# Patient Record
Sex: Male | Born: 1965
Health system: Southern US, Community
[De-identification: ages and names within clinical notes are randomized; demographics above are authoritative.]

## PROBLEM LIST (undated history)

## (undated) ENCOUNTER — Emergency Department (HOSPITAL_COMMUNITY): Payer: Medicaid Other

## (undated) DIAGNOSIS — I10 Essential (primary) hypertension: Secondary | ICD-10-CM

## (undated) DIAGNOSIS — I455 Other specified heart block: Secondary | ICD-10-CM

## (undated) DIAGNOSIS — I2510 Atherosclerotic heart disease of native coronary artery without angina pectoris: Secondary | ICD-10-CM

## (undated) HISTORY — DX: Atherosclerotic heart disease of native coronary artery without angina pectoris: I25.10

## (undated) HISTORY — DX: Other specified heart block: I45.5

## (undated) HISTORY — PX: TONSILLECTOMY AND ADENOIDECTOMY: SUR1326

## (undated) HISTORY — DX: Essential (primary) hypertension: I10

---

## 1999-03-09 ENCOUNTER — Emergency Department (HOSPITAL_COMMUNITY): Admission: EM | Admit: 1999-03-09 | Discharge: 1999-03-09 | Payer: Self-pay | Admitting: Emergency Medicine

## 2002-09-02 ENCOUNTER — Emergency Department (HOSPITAL_COMMUNITY): Admission: EM | Admit: 2002-09-02 | Discharge: 2002-09-02 | Payer: Self-pay | Admitting: Emergency Medicine

## 2014-11-04 ENCOUNTER — Ambulatory Visit (INDEPENDENT_AMBULATORY_CARE_PROVIDER_SITE_OTHER): Payer: Self-pay | Admitting: Family Medicine

## 2014-11-04 VITALS — BP 180/100 | HR 66 | Temp 98.2°F | Resp 16 | Ht 72.0 in | Wt 187.0 lb

## 2014-11-04 DIAGNOSIS — I4510 Unspecified right bundle-branch block: Secondary | ICD-10-CM

## 2014-11-04 DIAGNOSIS — I10 Essential (primary) hypertension: Secondary | ICD-10-CM | POA: Insufficient documentation

## 2014-11-04 DIAGNOSIS — R9431 Abnormal electrocardiogram [ECG] [EKG]: Secondary | ICD-10-CM

## 2014-11-04 DIAGNOSIS — R350 Frequency of micturition: Secondary | ICD-10-CM

## 2014-11-04 LAB — GLUCOSE, POCT (MANUAL RESULT ENTRY): POC Glucose: 76 mg/dl (ref 70–99)

## 2014-11-04 LAB — POCT URINALYSIS DIPSTICK
Blood, UA: NEGATIVE
Glucose, UA: NEGATIVE
LEUKOCYTES UA: NEGATIVE
Nitrite, UA: NEGATIVE
Spec Grav, UA: 1.025
Urobilinogen, UA: 1
pH, UA: 5.5

## 2014-11-04 MED ORDER — LISINOPRIL-HYDROCHLOROTHIAZIDE 10-12.5 MG PO TABS: 1.0000 | ORAL_TABLET | Freq: Every day | ORAL | Status: DC

## 2014-11-04 MED ORDER — PREMIUM AUTOMATIC BP MONITOR DEVI: 1.0000 | Freq: Every day | Status: DC

## 2014-11-04 NOTE — Progress Notes (Signed)
Blythe Hartshorn  MRN: [id number] DOB: 1965-10-15  Subjective:  Pt presents to clinic because he is concerned about his BP.  6 years ago he was told he had elevated BP and was put on medications but he stopped them and has not had care since then.  He had a DOT exam about 5 months and he passed and was given a 2 year card.  Headaches are temporal and squeezing - daily HA for about a month.  Has not been checking BP.  He has had no CP or SOB or LE edema.  He has a strong family history of HTN.  He does not believe he has ever had an EKG.  There are no active problems to display for this patient.   No current outpatient prescriptions on file prior to visit.   No current facility-administered medications on file prior to visit.    No Known Allergies  Review of Systems  Constitutional: Negative for fever and chills.  Cardiovascular: Negative for chest pain and leg swelling.  Neurological: Positive for headaches.   Objective:  BP 180/100 mmHg  Pulse 66  Temp(Src) 98.2 F (36.8 C) (Oral)  Resp 16  Ht 6' (1.829 m)  Wt 187 lb (84.823 kg)  BMI 25.36 kg/m2  SpO2 99%   R arm - 180/100 L arm - 170/100  Physical Exam  Constitutional: He is oriented to person, place, and time and well-developed, well-nourished, and in no distress.  HENT:  Head: Normocephalic and atraumatic.  Right Ear: External ear normal.  Left Ear: External ear normal.  Eyes: Conjunctivae are normal.  Neck: Normal range of motion.  Cardiovascular: Normal rate, regular rhythm, normal heart sounds and intact distal pulses.   Pulmonary/Chest: Effort normal and breath sounds normal. He has no wheezes.  Musculoskeletal:       Right lower leg: He exhibits no edema.       Left lower leg: He exhibits no edema.  Neurological: He is alert and oriented to person, place, and time. Gait normal.  Skin: Skin is warm and dry.  Psychiatric: Mood, memory, affect and judgment normal.   Results for orders placed or performed in  visit on 11/04/14  POCT urinalysis dipstick  Result Value Ref Range   Color, UA yellow    Clarity, UA clear    Glucose, UA neg    Bilirubin, UA small    Ketones, UA trace    Spec Grav, UA 1.025    Blood, UA neg    pH, UA 5.5    Protein, UA trace    Urobilinogen, UA 1.0    Nitrite, UA neg    Leukocytes, UA Negative Negative  POCT glucose (manual entry)  Result Value Ref Range   POC Glucose 76 70 - 99 mg/dl    EKG - RBBB without other acute changes Assessment and Plan :  Essential hypertension - Plan: COMPLETE METABOLIC PANEL WITH GFR, TSH, POCT urinalysis dipstick, EKG 12-Lead, lisinopril-hydrochlorothiazide (PRINZIDE,ZESTORETIC) 10-12.5 MG per tablet - start BP medication - pt to check BP with home monitoring cuff - he will recheck with me in about 3 weeks and we will try and get his cardiology appt at the same time - he will contact me if he has any problems before that appt  Nonspecific abnormal electrocardiogram (ECG) (EKG) - to cardiology for evaluation due to presumed new RBBB - ? Related to possible LVH due to elevated BP  RBBB (right bundle branch block)  D/w Dr Nance Pew  Erhard Senske PA-C  [facility] 11/04/2014 7:18 PM  I have discussed this pts care with Ms. Noele Icenhour. He does have an abnormal EKG consistent with RBBB but adamantly denies any CP.  Will start treating his HTN and arrange follow-up with cardiology

## 2014-11-04 NOTE — Patient Instructions (Signed)
I will contact you with your lab results as soon as they are available.   If you have not heard from me in 2 weeks, please contact me.  The fastest way to get your results is to register for My Chart (see the instructions on the last page of this printout).  Check your BP 2-3 times a week and when and if your headache gets worse -  If you developed shortness of breath and or chest pain - please seek medical care - you have the EKG we ran today for comparison if you need it  Decrease the salt in your diet

## 2014-11-05 LAB — COMPLETE METABOLIC PANEL WITH GFR
ALT: 15 U/L (ref 9–46)
AST: 18 U/L (ref 10–35)
Albumin: 4.4 g/dL (ref 3.6–5.1)
Alkaline Phosphatase: 95 U/L (ref 40–115)
BUN: 15 mg/dL (ref 7–25)
CALCIUM: 9.1 mg/dL (ref 8.6–10.3)
CHLORIDE: 103 mmol/L (ref 98–110)
CO2: 27 mmol/L (ref 20–31)
CREATININE: 1.17 mg/dL (ref 0.70–1.33)
GFR, Est African American: 83 mL/min (ref >=60)
GFR, Est Non African American: 72 mL/min (ref >=60)
GLUCOSE: 75 mg/dL (ref 65–99)
POTASSIUM: 3.8 mmol/L (ref 3.5–5.3)
SODIUM: 139 mmol/L (ref 135–146)
Total Bilirubin: 0.5 mg/dL (ref 0.2–1.2)
Total Protein: 7.4 g/dL (ref 6.1–8.1)

## 2014-11-05 LAB — TSH: TSH: 1.176 u[IU]/mL (ref 0.350–4.500)

## 2014-11-15 ENCOUNTER — Encounter: Payer: Self-pay | Admitting: Family Medicine

## 2014-11-15 DIAGNOSIS — E041 Nontoxic single thyroid nodule: Secondary | ICD-10-CM

## 2014-12-06 ENCOUNTER — Ambulatory Visit (INDEPENDENT_AMBULATORY_CARE_PROVIDER_SITE_OTHER): Payer: Self-pay | Admitting: Physician Assistant

## 2014-12-06 ENCOUNTER — Other Ambulatory Visit: Payer: Self-pay

## 2014-12-06 ENCOUNTER — Ambulatory Visit
Admission: RE | Admit: 2014-12-06 | Discharge: 2014-12-06 | Disposition: A | Discharge status: Home / Self Care | Payer: No Typology Code available for payment source | Source: Ambulatory Visit | Attending: Physician Assistant | Admitting: Physician Assistant

## 2014-12-06 VITALS — BP 126/74 | HR 81 | Temp 98.6°F | Resp 18 | Ht 72.0 in | Wt 191.0 lb

## 2014-12-06 DIAGNOSIS — E041 Nontoxic single thyroid nodule: Secondary | ICD-10-CM

## 2014-12-06 DIAGNOSIS — I10 Essential (primary) hypertension: Secondary | ICD-10-CM

## 2014-12-06 IMAGING — US US SOFT TISSUE HEAD/NECK
1 series · 14 of 25 positions shown · non-contrast
Comparison: None.

CLINICAL DATA: Thyroid nodule

EXAM:
THYROID ULTRASOUND
TECHNIQUE: Ultrasound examination of the thyroid gland and adjacent soft
tissues was performed.

[Series 1: us soft tissue head/neck · 0.08mm/px · 14 of 46 slices shown]
[im 1/46]
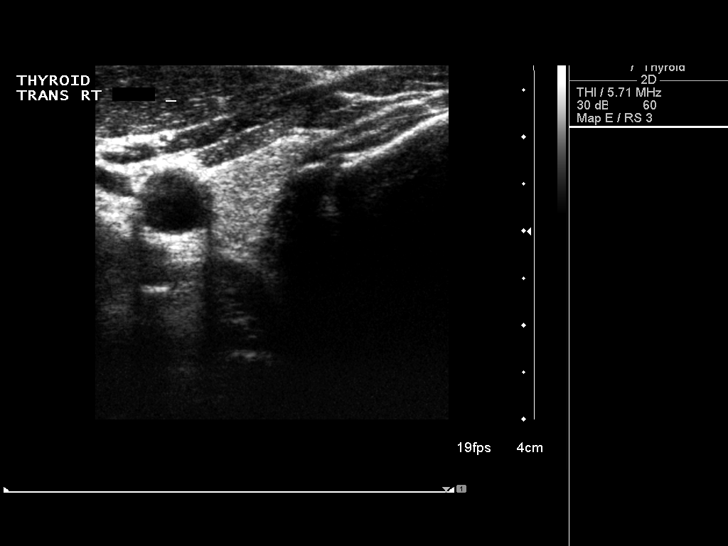
[im 4/46]
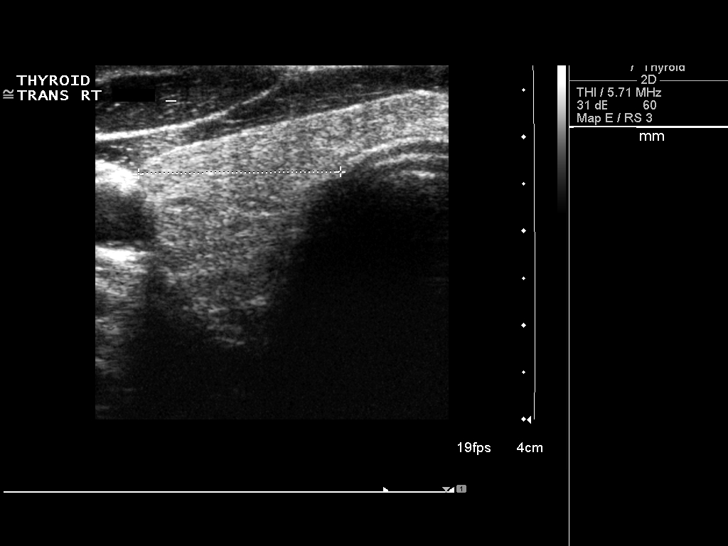
[im 8/46]
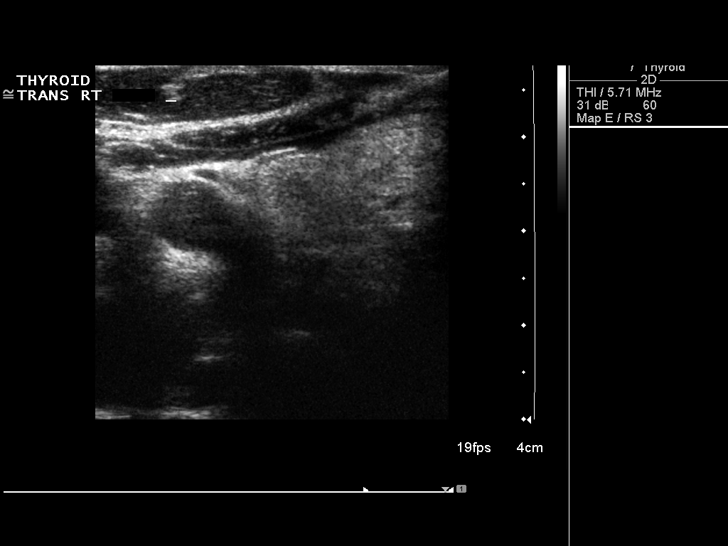
[im 12/46]
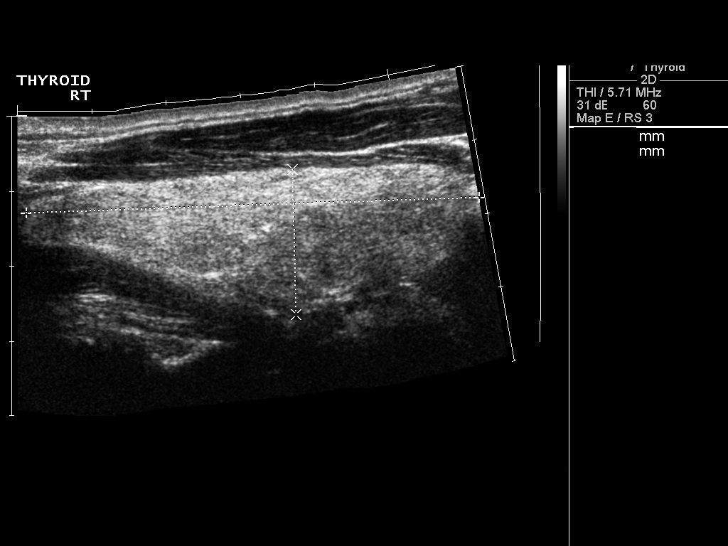
[im 16/46]
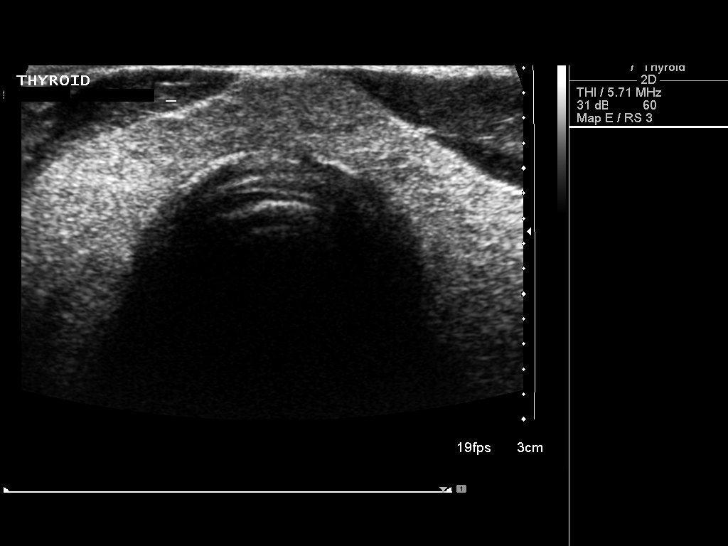
[im 17/46]
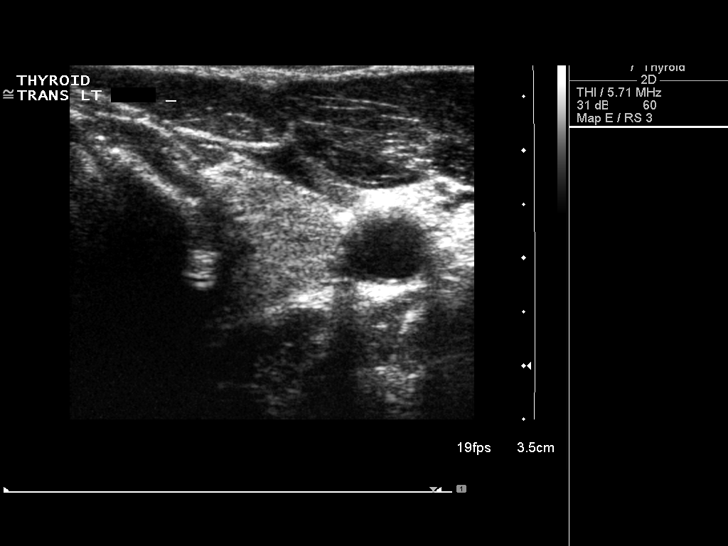
[im 21/46]
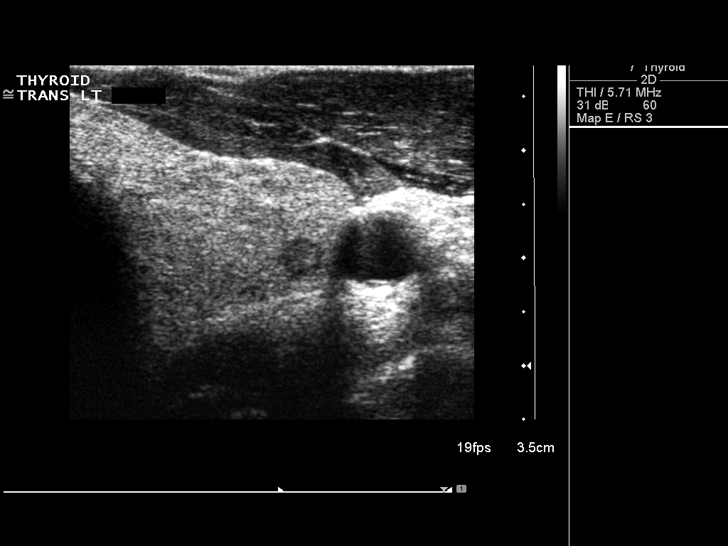
[im 25/46]
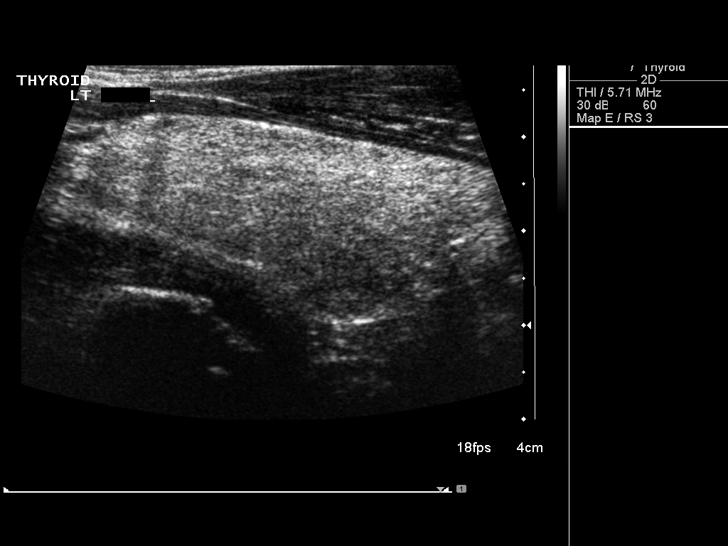
[im 29/46]
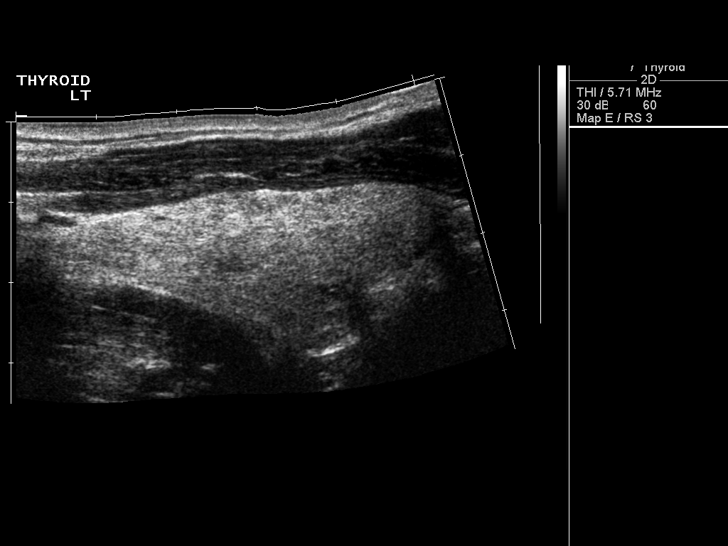
[im 31/46]
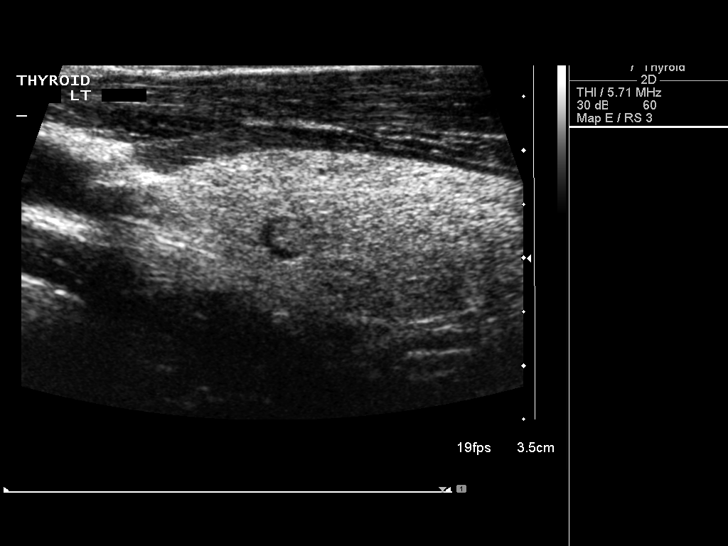
[im 34/46]
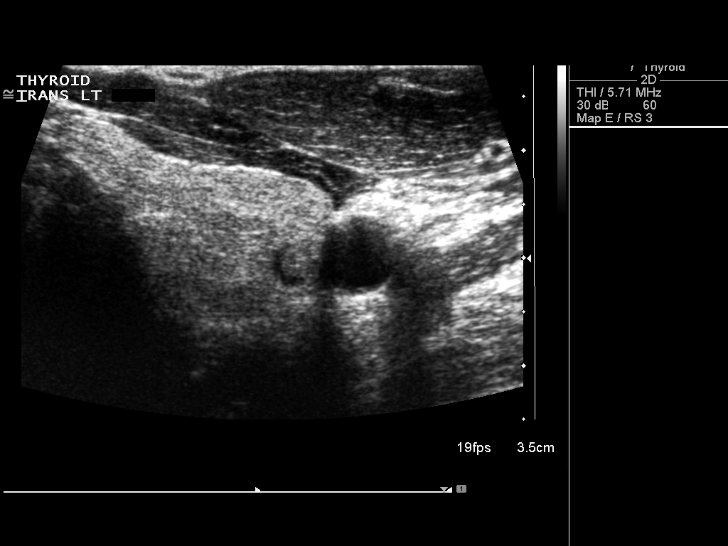
[im 38/46]
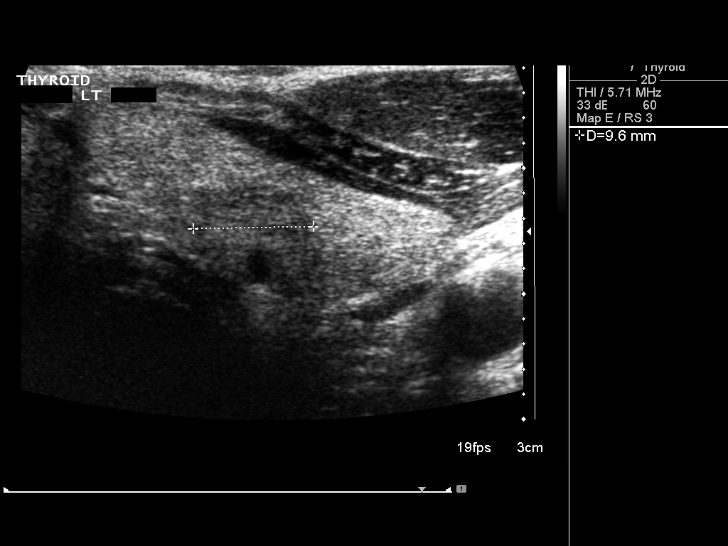
[im 42/46]
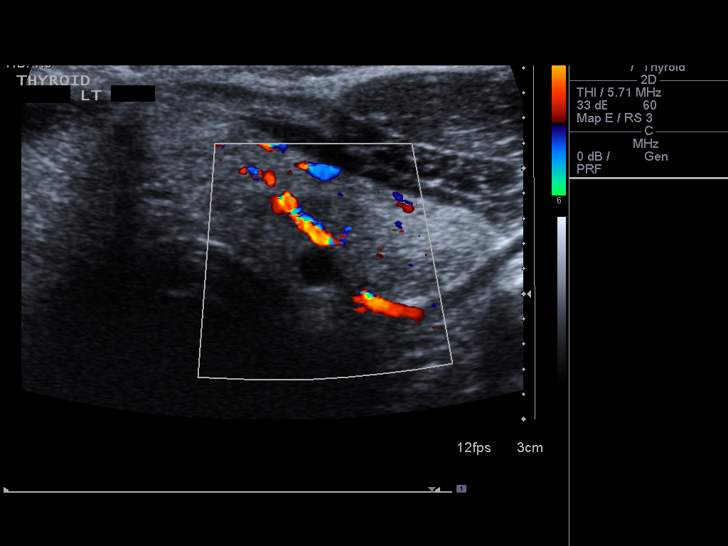
[im 46/46]
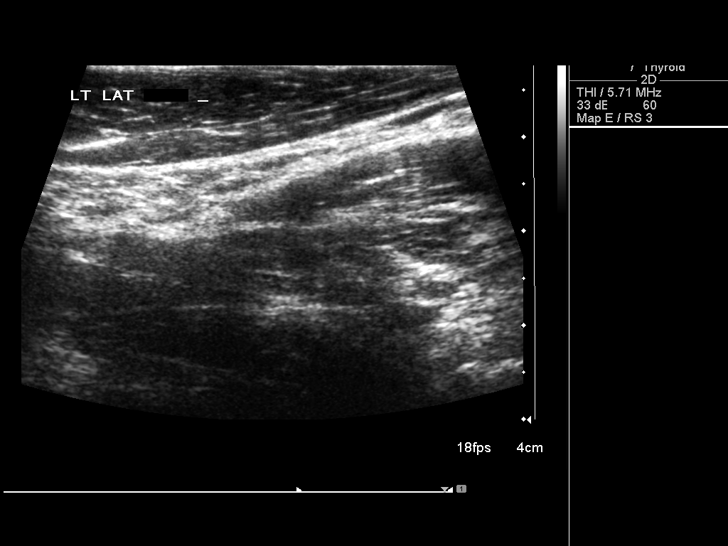

[14 of 25 positions shown; findings below may reference images not displayed]

FINDINGS: Right thyroid lobe

Measurements: 61 x 19 x 21 mm. Mildly inhomogeneous background
echotexture without focal lesion.

Left thyroid lobe

Measurements: 53 x 20 x 22 mm. 5 x 4 mm complex mostly solid nodule,
mid lobe. Somewhat poorly marginated slightly hypoechoic 12 x 9 x 10
mm nodule, inferior pole.

Isthmus

Thickness: 6 mm.  No nodules visualized.

Lymphadenopathy

None visualized.
IMPRESSION: 1. Thyromegaly with small left nodules. Findings do not meet current
consensus criteria for biopsy. Follow-up by clinical exam is
recommended. If patient has known risk factors for thyroid
carcinoma, consider follow-up ultrasound in 12 months. If patient is
clinically hyperthyroid, consider nuclear medicine thyroid uptake
and scan. This recommendation follows the consensus statement:
Management of Thyroid Nodules Detected as US: Society of
Radiologists in Ultrasound Consensus Conference Statement. Radiology

## 2014-12-06 MED ORDER — LISINOPRIL-HYDROCHLOROTHIAZIDE 20-25 MG PO TABS: 1.0000 | ORAL_TABLET | Freq: Every day | ORAL | Status: DC

## 2014-12-06 NOTE — Patient Instructions (Signed)
Keep tracking  Your BP - today when I took your BP my reading was 132/84 and your monitor read 159/93 - so as long as your readings are not above 165/100 that should be fine - please contact me if you have any problems.

## 2014-12-06 NOTE — Progress Notes (Signed)
Subjective:     Patient ID: Robert [name], male   DOB: [birth year], 49 y.o.   MRN: [id number] PCP: No primary care provider on file.  Chief Complaint  Patient presents with  . Follow-up  . Hypertension    HPI Patient presents today for follow-up of blood pressure. He was seen at this clinic on 08/26 for concerns about elevated blood pressure. He was started on lisinopril-hydrochlorothiazide 10-12.5 mg once a day.   He has been monitoring his BP at home. On 09/06, he sent a My Chart message about concerns that his BP was reading in the 150s systolic at home. He was counseled to start taking the lisinopril-hydrochlorothiazide 10-12.5 mg tablets twice a day, which he has been doing since then. He states that he has still been getting elevated readings in the 150s systolic at home with his wrist cuff.   Headaches have been less frequent since starting on the medication. No chest pain or shortness of breath. No visual disturbances. No swelling in lower extremities.   He has an appointment with cardiology at the end of October.   Review of Systems See HPI    Objective:  Physical Exam  Constitutional: He is oriented to person, place, and time. He appears well-developed and well-nourished.  HENT:  Head: Normocephalic and atraumatic.  Neck: Normal range of motion. Neck supple.  Cardiovascular: Normal rate and regular rhythm.   Pulmonary/Chest: Effort normal and breath sounds normal.  Neurological: He is alert and oriented to person, place, and time.  Skin: Skin is warm and dry.  Psychiatric: He has a normal mood and affect. His behavior is normal. Thought content normal.    BP 126/74 mmHg  Pulse 81  Temp(Src) 98.6 F (37 C) (Oral)  Resp 18  Ht 6' (1.829 m)  Wt 191 lb (86.637 kg)  BMI 25.90 kg/m2  SpO2 98%  The patient brought his blood pressure cuff to the visit. His BP was retaken with the manual cuff and the reading was 132/84 of the right arm. At the same time, the patient was  taking his BP with his wrist cuff on the left wrist and the reading was 159/93.   Assessment & Plan:  1. Essential hypertension New prescription was written, lisinopril-hydrochlorothiazide 20-25 tablets, which the patient is to take once a day. Counseled that his wrist cuff is not accurate and is giving him higher readings. If his wrist cuff shows a reading above 165/100, he is to call the office.  - lisinopril-hydrochlorothiazide (PRINZIDE,ZESTORETIC) 20-25 MG tablet; Take 1 tablet by mouth daily.  Dispense: 90 tablet; Refill: 1  Return in about 6 months (around 06/05/2014) for follow-up. Cardiology appointment in October.    Tifini Reeder D. Race, PA-S Physician Assistant Student [facility]

## 2014-12-06 NOTE — Progress Notes (Signed)
   Robert [name]  MRN: [id number] DOB: 04/17/1965  Subjective:  Pt presents to clinic for recheck of his BP.  He has been doing really well - he has been taking Lisinopril 10/12.5 bid.  He has been checking his BP at home with a wrist cuff and it has been running high 150s/ 90s.  He feels much better since starting on the medications.  He is having no problems with the medications.  He plans to go to his appt with cardiology on 10/27 with Dr Mayford Knife.  Patient Active Problem List   Diagnosis Date Noted  . HTN (hypertension) 11/04/2014    Current Outpatient Prescriptions on File Prior to Visit  Medication Sig Dispense Refill  . Blood Pressure Monitoring (PREMIUM AUTOMATIC BP MONITOR) DEVI 1 Device by Does not apply route daily. 1 Device 0   No current facility-administered medications on file prior to visit.    No Known Allergies  Review of Systems  Respiratory: Negative for cough.   Cardiovascular: Negative for chest pain, palpitations and leg swelling.   Objective:  BP 126/74 mmHg  Pulse 81  Temp(Src) 98.6 F (37 C) (Oral)  Resp 18  Ht 6' (1.829 m)  Wt 191 lb (86.637 kg)  BMI 25.90 kg/m2  SpO2 98%  Physical Exam  Constitutional: He is oriented to person, place, and time and well-developed, well-nourished, and in no distress.  HENT:  Head: Normocephalic and atraumatic.  Right Ear: External ear normal.  Left Ear: External ear normal.  Eyes: Conjunctivae are normal.  Neck: Normal range of motion.  Cardiovascular: Normal rate, regular rhythm, normal heart sounds and intact distal pulses.   Pulmonary/Chest: Effort normal and breath sounds normal. He has no wheezes.  Musculoskeletal:       Right lower leg: He exhibits no edema.       Left lower leg: He exhibits no edema.  Neurological: He is alert and oriented to person, place, and time. Gait normal.  Skin: Skin is warm and dry.  Psychiatric: Mood, memory, affect and judgment normal.   132/84 - manual BP reading 159/93 -  wrist cuff reading  Assessment and Plan :  Essential hypertension - Plan: lisinopril-hydrochlorothiazide (PRINZIDE,ZESTORETIC) 20-25 MG tablet  We will continue medication - we will recheck in 6 months - he will continue to monitor his BP at home with his wrist cuff but he is now aware that his readings will be higher than what normal range is because his machine runs high as we demonstrated today while he was in the office.  He will f/u with cardiology on 10/27 and let me know if he has any problems.  Benny Lennert PA-C  [facility] 12/06/2014 4:21 PM

## 2015-01-05 ENCOUNTER — Ambulatory Visit: Payer: Self-pay | Admitting: Cardiology

## 2015-02-01 ENCOUNTER — Ambulatory Visit: Payer: Self-pay | Admitting: Cardiology

## 2015-02-01 DIAGNOSIS — R0989 Other specified symptoms and signs involving the circulatory and respiratory systems: Secondary | ICD-10-CM

## 2015-02-07 ENCOUNTER — Encounter: Payer: Self-pay | Admitting: Cardiology

## 2015-06-28 ENCOUNTER — Telehealth: Payer: Self-pay

## 2015-06-28 DIAGNOSIS — I10 Essential (primary) hypertension: Secondary | ICD-10-CM

## 2015-06-28 MED ORDER — LISINOPRIL-HYDROCHLOROTHIAZIDE 20-25 MG PO TABS: 1.0000 | ORAL_TABLET | Freq: Every day | ORAL | Status: DC

## 2015-06-28 NOTE — Telephone Encounter (Signed)
Maudia called me to ask about this pt and I advised her I will send in 1 mos to give pt time to get in. She stated she will let him know. He had told her that he would come in next week.

## 2015-06-28 NOTE — Telephone Encounter (Signed)
Pt isn't happy, states he have been sitting out in the waiting room for over 2 hrs since he had brought someone over to see the Dr states he checked with us to let us know he is in need of His bp meds and we wrote it down for him on a piece of paper but didn't call it in. 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 Please call pt at [phone]     [facility] [address]

## 2015-09-26 ENCOUNTER — Telehealth: Payer: Self-pay

## 2015-09-26 NOTE — Telephone Encounter (Signed)
Pt would like a refill on lisinopril-hydrochlorothiazide (PRINZIDE,ZESTORETIC) 20-25 MG tablet [[id number]][[id number]]. I let pt know he needs an OV for an additional refill. He is in [place] wanting it faxed there. [facility]  Address: [address], [place], [place] [id number]. Please advise at [phone]

## 2015-10-31 ENCOUNTER — Ambulatory Visit (INDEPENDENT_AMBULATORY_CARE_PROVIDER_SITE_OTHER): Payer: Self-pay | Admitting: Physician Assistant

## 2015-10-31 VITALS — BP 144/96 | HR 64 | Temp 98.2°F | Resp 18 | Ht 71.0 in | Wt 171.4 lb

## 2015-10-31 DIAGNOSIS — Z1159 Encounter for screening for other viral diseases: Secondary | ICD-10-CM

## 2015-10-31 DIAGNOSIS — Z1211 Encounter for screening for malignant neoplasm of colon: Secondary | ICD-10-CM

## 2015-10-31 DIAGNOSIS — I10 Essential (primary) hypertension: Secondary | ICD-10-CM

## 2015-10-31 DIAGNOSIS — Z021 Encounter for pre-employment examination: Secondary | ICD-10-CM

## 2015-10-31 DIAGNOSIS — Z23 Encounter for immunization: Secondary | ICD-10-CM

## 2015-10-31 DIAGNOSIS — Z114 Encounter for screening for human immunodeficiency virus [HIV]: Secondary | ICD-10-CM

## 2015-10-31 LAB — COMPLETE METABOLIC PANEL WITH GFR
ALT: 19 U/L (ref 9–46)
AST: 17 U/L (ref 10–35)
Albumin: 4.6 g/dL (ref 3.6–5.1)
Alkaline Phosphatase: 83 U/L (ref 40–115)
BILIRUBIN TOTAL: 0.7 mg/dL (ref 0.2–1.2)
BUN: 12 mg/dL (ref 7–25)
CALCIUM: 9.2 mg/dL (ref 8.6–10.3)
CHLORIDE: 104 mmol/L (ref 98–110)
CO2: 27 mmol/L (ref 20–31)
CREATININE: 1.09 mg/dL (ref 0.70–1.33)
GFR, EST NON AFRICAN AMERICAN: 78 mL/min (ref >=60)
Glucose, Bld: 81 mg/dL (ref 65–99)
Potassium: 3.9 mmol/L (ref 3.5–5.3)
Sodium: 141 mmol/L (ref 135–146)
TOTAL PROTEIN: 7.7 g/dL (ref 6.1–8.1)

## 2015-10-31 MED ORDER — LISINOPRIL-HYDROCHLOROTHIAZIDE 20-25 MG PO TABS: 1.0000 | ORAL_TABLET | Freq: Every day | ORAL | 0 refills | Status: DC

## 2015-10-31 NOTE — Progress Notes (Signed)
   Robert KuRobert Camus  MRN: [id number] DOB: 07-08-1965  Subjective:  Pt presents to clinic for BP recheck.  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  Review of Systems  Respiratory: Negative for cough and shortness of breath.   Cardiovascular: Negative for chest pain, palpitations and leg swelling.    Patient Active Problem List   Diagnosis Date Noted  . HTN (hypertension) 11/04/2014    Current Outpatient Prescriptions on File Prior to Visit  Medication Sig Dispense Refill  . Blood Pressure Monitoring (PREMIUM AUTOMATIC BP MONITOR) DEVI 1 Device by Does not apply route daily. 1 Device 0   No current facility-administered medications on file prior to visit.     No Known Allergies  Pt patients past, family and social history were reviewed and updated.  Objective:  BP (!) 144/96   Pulse 64   Temp 98.2 F (36.8 C) (Oral)   Resp 18   Ht 5\' 11"  (1.803 m)   Wt 171 lb 6.4 oz (77.7 kg)   BMI 23.91 kg/m   Physical Exam  Constitutional: He is oriented to person, place, and time and well-developed, well-nourished, and in no distress.  HENT:  Head: Normocephalic and atraumatic.  Right Ear: External ear normal.  Left Ear: External ear normal.  Eyes: Conjunctivae are normal.  Neck: Normal range of motion.  Cardiovascular: Normal rate, regular rhythm, normal heart sounds and intact distal pulses.   Pulmonary/Chest: Effort normal and breath sounds normal. He has no wheezes.  Musculoskeletal:       Right lower leg: He exhibits no edema.       Left lower leg: He exhibits no edema.  Neurological: He is alert and oriented to person, place, and time. Gait normal.  Skin: Skin is warm and dry.  Psychiatric: Mood, memory, affect and judgment normal.    Assessment and Plan :  Essential hypertension - Plan: COMPLETE METABOLIC PANEL WITH GFR, lisinopril-hydrochlorothiazide (PRINZIDE,ZESTORETIC)  20-25 MG tablet - restart medications and recheck before 3 months when his DOT will expire.  Need for Tdap vaccination  Need for hepatitis C screening test - Plan: Hepatitis C antibody  Encounter for screening for HIV - Plan: HIV antibody  Screen for colon cancer - Plan: Ambulatory referral to Gastroenterology  Benny LennertSarah Shanedra Lave PA-C  [facility] 10/31/2015 3:28 PM

## 2015-10-31 NOTE — Patient Instructions (Addendum)
Recheck with me in before 3 months at that time I will recheck your BP and extend your DOT card at that time - you will want to call and make sure that you see me at that visit - I will be working on Tuesday and Thursdays    IF you received an x-ray today, you will receive an invoice from [facility]. Please contact [facility] at [phone] with questions or concerns regarding your invoice.   IF you received labwork today, you will receive an invoice from [facility]. Please contact [facility] at [phone] with questions or concerns regarding your invoice.   Our billing staff will not be able to assist you with questions regarding bills from these companies.  You will be contacted with the lab results as soon as they are available. The fastest way to get your results is to activate your My Chart account. Instructions are located on the last page of this paperwork. If you have not heard from us regarding the results in 2 weeks, please contact this office.

## 2015-10-31 NOTE — Progress Notes (Signed)
This patient presents for DOT examination for fitness for duty. He has had 1 year cards in the past due to HTN.  Medical History:  no  1. Head/Brain Injuries, disorders or illnesses no  2. Seizures, epilepsy no  3. Eye disorders or impaired vision (except corrective lenses) no  4. Ear disorders, loss of hearing or balance no  5. Heart disease or heart attack, other cardiovascular condition no  6. Heart surgery (valve replacement/bypass, angioplasty, pacemaker/defribrillator) yes  7. High blood pressure -- on medications no  8. High holesterol no  9. Chronic cough, shortness of breath or other breathing problems no  10. Lung disease (emphysema, asthma or chronic bronchitis) no  11. Kidney disease, dialysis no  12. Digestive problems  no  13. Diabetes or elevated blood sugar  no  Insulin use no  14. Nervous or psychiatric disorders, e.g., severe depression no  15. Fainting or syncope no  16. Dizziness, headaches, numbness, tingling or memory loss no  17. Unexplained weight loss no  18. Stroke, TIA or paralysis no  19. Missing or impaired hand, arm, foot, leg, finger, toe no  20. Spinal injury or disease no  21. Bone, muscles or nerve problems no  22. Blood clots or bleeding bleeding disorders no  23. Cancer no  24. Chronic infection or other chronic diseases no  25. Sleep disorders, pauses in breathing while asleep, daytime sleepiness, loud snoring no  26. Have you ever had a sleep test? no  27.  Have you ever spent a night in the hospital? no  28. Have you ever had a broken bone? yes  29. Have you or or do you use tobacco products? - 1 ppd no  30. Regular, frequent alcohol use no  31. Illegal substance use within the past 2 years no  32.  Have you ever failed a drug test or been dependent on an illegal substance?  Current Medications: Prior to Admission medications   Medication Sig Start Date End Date Taking? Authorizing Provider  Blood Pressure Monitoring (PREMIUM AUTOMATIC  BP MONITOR) DEVI 1 Device by Does not apply route daily. 11/04/14  Yes Morrell RiddleSarah L Weber, PA-C  lisinopril-hydrochlorothiazide (PRINZIDE,ZESTORETIC) 20-25 MG tablet Take 1 tablet by mouth daily. Patient not taking: Reported on 10/31/2015 06/28/15   Morrell RiddleSarah L Weber, PA-C    Medical Examiner's Comments on Health History:  HTN - been off of medications for the last couple of months  TESTING:   Visual Acuity Screening   Right eye Left eye Both eyes  Without correction: 20/20 20/20 20/20   With correction:     Comments: The patient can distinguish the colors red, amber and green. Peripheral Vision: Right eye 85 degrees. Left eye 85 degrees.   Monocular Vision: No.  Hearing Aid used for test: No. Hearing Aid required to to meet standard: No.  BP (!) 150/84 (BP Location: Left Arm, Patient Position: Sitting, Cuff Size: Large)   Pulse 64   Temp 98.2 F (36.8 C) (Oral)   Resp 18   Ht 5\' 11"  (1.803 m)   Wt 171 lb 6.4 oz (77.7 kg)   BMI 23.91 kg/m  Pulse rate is regular  Comments: UA - Prot. - zero; Blood - zero; SG 1.020;  Gluc. -zero.  PHYSICAL EXAMINATION:  1. No. General Appearance: Marked overweight, tremor, signs of alcoholism, problem drinking or drug abuse. 2. No. Skin Exam - tattoos, scars 3. No. Eyes: pupillary equality, reaction to light, accommodation, ocular motility, ocular muscle imbalance, extra ocular movement,  nystagmus, exopthalmos. Ask about retinopathy, cataracts, aphakia, glaucoma, macular degeneration and refer to a specialist if appropriate.  4. No. Ears: Scarring of tympanic membrane, occlusion of external canal, perforated eardrums.     5. No. Mouth and Throat: Irremedial deformities likely to interfere with breathing or swallowing.    6. No. Heart: Murmurs, extra sounds, enlarged heart, pacemaker, implantable defibrillator.     7. No. Lungs and Chest, not including breast examination: Abnormal Chest wall expansion, abnormal respiratory rate, abnormal breath sounds  including wheezes or alveolar rates, impaired respiratory function, cyanosis. Abnormal findings on physical exam may require further testing such as pulmonary tests and/or x ray of chest.  8. No. Abdomen and Viscera: Enlarged liver, enlarged spleen, masses, bruits, hernia, significant abdominal wall muscle weakness.  9. No. Genitourinary System: Hernia  10. No. Spine, other musculoskeletal: Previous surgery, deformities, limitation of motion, tenderness. 11. No. Extremities-Limb impaired: Loss or impairment of leg, foot, toe, arm, hand, finger. Perceptible limp, deformities, atrophy, weakness, paralysis, clubbing, edema, hypotonia. Insufficient grasp and prehension to maintain steering wheel grip. Insufficient mobility and strength in lower limb to operate pedals properly. 12. No. Neurological: Impaired equilibrium, coordination or speech pattern; paresthesia, asymmetric deep tendon reflexes, sensory or positional abnormalities, abnormal patellar and Babinski's reflexes 13. No. Gait - antalgic, ataxia  14. No. Vascular System: Abnormal pulse and amplitude, carotid or arterial bruits, varicose veins.   Does not meet standards. Certification Status: does not meet standards for 2 year certificate. Meets standards, but periodic monitoring required due to: HTN  Driver qualified only for: 3 months   Return to medical examiner's office for follow-up on   Wearing corrective lenses: no Wearing hearing aid: no Accompanied by a no waiver/exemption Skill performance Evaluation (SPE) Certificate: no Driving within an exempt intracity zone: no Qualified by operation of 49 CFR 391.64: no  Certification expires 01/31/2016  Benny LennertSarah Weber PA-C  [facility] 10/31/2015 3:05 PM

## 2015-11-01 LAB — HIV ANTIBODY (ROUTINE TESTING W REFLEX): HIV: NONREACTIVE

## 2015-11-01 LAB — HEPATITIS C ANTIBODY: HCV AB: NEGATIVE

## 2015-12-04 ENCOUNTER — Encounter: Payer: Self-pay | Admitting: Physician Assistant

## 2016-02-24 ENCOUNTER — Other Ambulatory Visit: Payer: Self-pay | Admitting: Physician Assistant

## 2016-02-24 DIAGNOSIS — I10 Essential (primary) hypertension: Secondary | ICD-10-CM

## 2016-02-27 NOTE — Telephone Encounter (Signed)
Spoke with pt.  He is not out of meds at this time. Advised he needs to make appointment.  SHB 01/2016. Pt agreed he would make appt.

## 2016-03-26 ENCOUNTER — Encounter: Payer: Self-pay | Admitting: Physician Assistant

## 2020-04-26 ENCOUNTER — Other Ambulatory Visit: Payer: Self-pay | Admitting: Physician Assistant

## 2020-04-26 ENCOUNTER — Other Ambulatory Visit: Payer: Self-pay

## 2020-04-26 ENCOUNTER — Ambulatory Visit: Payer: Self-pay | Admitting: Physician Assistant

## 2020-04-26 VITALS — BP 153/98 | HR 64 | Temp 98.2°F | Resp 18 | Ht 72.0 in | Wt 170.0 lb

## 2020-04-26 DIAGNOSIS — M4302 Spondylolysis, cervical region: Secondary | ICD-10-CM | POA: Insufficient documentation

## 2020-04-26 DIAGNOSIS — Z114 Encounter for screening for human immunodeficiency virus [HIV]: Secondary | ICD-10-CM

## 2020-04-26 DIAGNOSIS — I10 Essential (primary) hypertension: Secondary | ICD-10-CM

## 2020-04-26 DIAGNOSIS — R200 Anesthesia of skin: Secondary | ICD-10-CM | POA: Insufficient documentation

## 2020-04-26 DIAGNOSIS — Z13228 Encounter for screening for other metabolic disorders: Secondary | ICD-10-CM

## 2020-04-26 DIAGNOSIS — M5412 Radiculopathy, cervical region: Secondary | ICD-10-CM

## 2020-04-26 DIAGNOSIS — Z125 Encounter for screening for malignant neoplasm of prostate: Secondary | ICD-10-CM

## 2020-04-26 MED ORDER — AMLODIPINE BESYLATE 10 MG PO TABS: 10.0000 mg | ORAL_TABLET | Freq: Every day | ORAL | 2 refills | Status: DC

## 2020-04-26 MED ORDER — GABAPENTIN 800 MG PO TABS
800.0000 mg | ORAL_TABLET | Freq: Two times a day (BID) | ORAL | 1 refills | Status: DC
Start: 2020-04-26 — End: 2020-07-12

## 2020-04-26 MED ORDER — LISINOPRIL-HYDROCHLOROTHIAZIDE 20-12.5 MG PO TABS: 1.0000 | ORAL_TABLET | Freq: Every day | ORAL | 2 refills | Status: DC

## 2020-04-26 MED FILL — GABAPENTIN 800 MG TABLET: 800 | 30 days supply | Qty: 60 | Fill #0

## 2020-04-26 MED FILL — LISINOPRIL-HCTZ 20-12.5 MG: 20-12.5 | 30 days supply | Qty: 30 | Fill #0

## 2020-04-26 MED FILL — AMLODIPINE BESYLATE 10 MG T: 10 | 30 days supply | Qty: 30 | Fill #0

## 2020-04-26 NOTE — Progress Notes (Signed)
Patient presents with a need for refills on BP medication and nerve pain medication. Patient denies pain at this time. Patient has been out of gabapentin for 2 weeks and BP medications he has been taking sparingly. Last took 2 days ago.

## 2020-04-26 NOTE — Patient Instructions (Signed)
Please return to the mobile unit in  2 weeks for fasting labs and follow-up of blood pressure.  Please let us know if there is anything else we can do for you  Roney Jaffe, PA-C Physician Assistant [facility] Mobile Medicine [email or web address]    Health Maintenance, Male Adopting a healthy lifestyle and getting preventive care are important in promoting health and wellness. Ask your health care provider about:  The right schedule for you to have regular tests and exams.  Things you can do on your own to prevent diseases and keep yourself healthy. What should I know about diet, weight, and exercise? Eat a healthy diet  Eat a diet that includes plenty of vegetables, fruits, low-fat dairy products, and lean protein.  Do not eat a lot of foods that are high in solid fats, added sugars, or sodium.   Maintain a healthy weight Body mass index (BMI) is a measurement that can be used to identify possible weight problems. It estimates body fat based on height and weight. Your health care provider can help determine your BMI and help you achieve or maintain a healthy weight. Get regular exercise Get regular exercise. This is one of the most important things you can do for your health. Most adults should:  Exercise for at least 150 minutes each week. The exercise should increase your heart rate and make you sweat (moderate-intensity exercise).  Do strengthening exercises at least twice a week. This is in addition to the moderate-intensity exercise.  Spend less time sitting. Even light physical activity can be beneficial. Watch cholesterol and blood lipids Have your blood tested for lipids and cholesterol at 58 years of age, then have this test every 5 years. You may need to have your cholesterol levels checked more often if:  Your lipid or cholesterol levels are high.  You are older than 57 years of age.  You are at high risk for heart  disease. What should I know about cancer screening? Many types of cancers can be detected early and may often be prevented. Depending on your health history and family history, you may need to have cancer screening at various ages. This may include screening for:  Colorectal cancer.  Prostate cancer.  Skin cancer.  Lung cancer. What should I know about heart disease, diabetes, and high blood pressure? Blood pressure and heart disease  High blood pressure causes heart disease and increases the risk of stroke. This is more likely to develop in people who have high blood pressure readings, are of African descent, or are overweight.  Talk with your health care provider about your target blood pressure readings.  Have your blood pressure checked: ? Every 3-5 years if you are 3-49 years of age. ? Every year if you are 46 years old or older.  If you are between the ages of 61 and 16 and are a current or former smoker, ask your health care provider if you should have a one-time screening for abdominal aortic aneurysm (AAA). Diabetes Have regular diabetes screenings. This checks your fasting blood sugar level. Have the screening done:  Once every three years after age 64 if you are at a normal weight and have a low risk for diabetes.  More often and at a younger age if you are overweight or have a high risk for diabetes. What should I know about preventing infection? Hepatitis B If you have a higher risk for hepatitis B, you should be screened for this virus. Talk with your  health care provider to find out if you are at risk for hepatitis B infection. Hepatitis C Blood testing is recommended for:  Everyone born from [birth year] through 1967.  Anyone with known risk factors for hepatitis C. Sexually transmitted infections (STIs)  You should be screened each year for STIs, including gonorrhea and chlamydia, if: ? You are sexually active and are younger than 57 years of age. ? You are older  than 57 years of age and your health care provider tells you that you are at risk for this type of infection. ? Your sexual activity has changed since you were last screened, and you are at increased risk for chlamydia or gonorrhea. Ask your health care provider if you are at risk.  Ask your health care provider about whether you are at high risk for HIV. Your health care provider may recommend a prescription medicine to help prevent HIV infection. If you choose to take medicine to prevent HIV, you should first get tested for HIV. You should then be tested every 3 months for as long as you are taking the medicine. Follow these instructions at home: Lifestyle  Do not use any products that contain nicotine or tobacco, such as cigarettes, e-cigarettes, and chewing tobacco. If you need help quitting, ask your health care provider.  Do not use street drugs.  Do not share needles.  Ask your health care provider for help if you need support or information about quitting drugs. Alcohol use  Do not drink alcohol if your health care provider tells you not to drink.  If you drink alcohol: ? Limit how much you have to 0-2 drinks a day. ? Be aware of how much alcohol is in your drink. In the [place] one drink equals one 12 oz bottle of beer (355 mL), one 5 oz glass of wine (148 mL), or one 1 oz glass of hard liquor (44 mL). General instructions  Schedule regular health, dental, and eye exams.  Stay current with your vaccines.  Tell your health care provider if: ? You often feel depressed. ? You have ever been abused or do not feel safe at home. Summary  Adopting a healthy lifestyle and getting preventive care are important in promoting health and wellness.  Follow your health care provider's instructions about healthy diet, exercising, and getting tested or screened for diseases.  Follow your health care provider's instructions on monitoring your cholesterol and blood pressure. This  information is not intended to replace advice given to you by your health care provider. Make sure you discuss any questions you have with your health care provider. Document Revised: 02/18/2018 Document Reviewed: 02/18/2018 Elsevier Patient Education  2021 [employer].

## 2020-04-27 NOTE — Progress Notes (Signed)
New Patient Office Visit  Subjective:  Patient ID: Robert [name], male    DOB: December 19, 1965  Age: 55 y.o. MRN: [id number]  CC:  Chief Complaint  Patient presents with  . Hypertension    Med Refil    HPI Robert [name] reports that he has been treated for hypertension and back pain and arm pain.  Reports that he was recently released from incarceration and was given some medication to bridge him until he found a primary care provider.  Reports that he has been running low on his medication and has been trying to stretch it out.  Reports that he has been out of his gabapentin for the last 2 weeks.  Does not check blood pressure at home.  Reports that he is being followed by neurosurgery for his back and arm pain, states that they are unable to proceed any further until he obtains insurance.  Past Medical History:  Diagnosis Date  . Hypertension     Past Surgical History:  Procedure Laterality Date  . TONSILLECTOMY AND ADENOIDECTOMY      Family History  Problem Relation Age of Onset  . Hypertension Mother   . Hypertension Father   . Hypertension Brother     Social History   Socioeconomic History  . Marital status: Single    Spouse name: Not on file  . Number of children: 2  . Years of education: HS diploma and some college  . Highest education level: Not on file  Occupational History  . Occupation: truck Hospital doctor    Comment: [employer]  . Smoking status: Current Every Day Smoker    Packs/day: 0.50    Types: Cigarettes  . Smokeless tobacco: Never Used  Substance and Sexual Activity  . Alcohol use: Yes    Alcohol/week: 0.0 standard drinks  . Drug use: No  . Sexual activity: Not Currently  Other Topics Concern  . 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   HENT: Negative.   Eyes: Negative.   Respiratory: Negative.   Cardiovascular: Negative.   Gastrointestinal: Negative.   Endocrine: Negative.   Genitourinary: Negative.   Musculoskeletal: Positive for arthralgias, myalgias and neck pain.  Skin: Negative.   Allergic/Immunologic: Negative.   Neurological: Negative.   Hematological: Negative.   Psychiatric/Behavioral: Negative.     Objective:   Today's Vitals: BP (!) 153/98 (BP Location: Right Arm, Patient Position: Sitting, Cuff Size: Normal)   Pulse 64   Temp 98.2 F (36.8 C) (Oral)   Resp 18   Ht 6' (1.829 m)   Wt 170 lb (77.1 kg)   SpO2 100%   BMI 23.06 kg/m   Physical Exam Nursing note reviewed.  Constitutional:      Appearance: Normal appearance.  HENT:     Head: Normocephalic and atraumatic.     Right Ear: External ear normal.     Left Ear: External ear normal.     Nose: Nose normal.     Mouth/Throat:     Mouth: Mucous membranes are moist.  Pharynx: Oropharynx is clear.  Eyes:     Conjunctiva/sclera: Conjunctivae normal.     Pupils: Pupils are equal, round, and reactive to light.  Cardiovascular:     Rate and Rhythm: Normal rate and regular rhythm.     Pulses: Normal pulses.     Heart sounds: Normal heart sounds.  Pulmonary:     Effort: Pulmonary effort is normal.     Breath sounds: Normal breath sounds.  Musculoskeletal:        General: Normal range of motion.     Cervical back: Normal range of motion and neck supple.  Skin:    General: Skin is warm and dry.  Neurological:     General: No focal deficit present.     Mental Status: He is alert and oriented to person, place, and time.  Psychiatric:        Mood and Affect: Mood normal.        Behavior: Behavior normal.         Thought Content: Thought content normal.        Judgment: Judgment normal.     Assessment & Plan:   Problem List Items Addressed This Visit      Cardiovascular and Mediastinum   HTN (hypertension) - Primary   Relevant Medications   amLODipine (NORVASC) 10 MG tablet   lisinopril-hydrochlorothiazide (ZESTORETIC) 20-12.5 MG tablet     Nervous and Auditory   Cervical radiculopathy   Relevant Medications   gabapentin (NEURONTIN) 800 MG tablet     Musculoskeletal and Integument   Cervical spondylolysis   Relevant Medications   gabapentin (NEURONTIN) 800 MG tablet     Other   Left upper extremity numbness   Relevant Medications   gabapentin (NEURONTIN) 800 MG tablet      Outpatient Encounter Medications as of 04/26/2020  Medication Sig  . [DISCONTINUED] amLODipine (NORVASC) 10 MG tablet Take by mouth.  . [DISCONTINUED] gabapentin (NEURONTIN) 800 MG tablet Take by mouth.  . [DISCONTINUED] lisinopril-hydrochlorothiazide (ZESTORETIC) 20-12.5 MG tablet Take 1 tablet by mouth daily.  Marland Kitchen amLODipine (NORVASC) 10 MG tablet Take 1 tablet (10 mg total) by mouth daily.  . Blood Pressure Monitoring (PREMIUM AUTOMATIC BP MONITOR) DEVI 1 Device by Does not apply route daily. (Patient not taking: Reported on 04/26/2020)  . gabapentin (NEURONTIN) 800 MG tablet Take 1 tablet (800 mg total) by mouth 2 (two) times daily.  Marland Kitchen lisinopril-hydrochlorothiazide (ZESTORETIC) 20-12.5 MG tablet Take 1 tablet by mouth daily.  . [DISCONTINUED] lisinopril-hydrochlorothiazide (PRINZIDE,ZESTORETIC) 20-25 MG tablet Take 1 tablet by mouth daily. (Patient not taking: Reported on 04/26/2020)   No facility-administered encounter medications on file as of 04/26/2020.   1. Primary hypertension Resume current regimen, patient does  have pill packs with him for verification of current regimen.  Patient to return to mobile medicine unit for fasting labs.  Patient given an appointment to establish care at [facility] at  [facility] on June 01, 2020, patient given appointment for [facility] health financial assistance. - amLODipine (NORVASC) 10 MG tablet; Take 1 tablet (10 mg total) by mouth daily.  Dispense: 30 tablet; Refill: 2 - lisinopril-hydrochlorothiazide (ZESTORETIC) 20-12.5 MG tablet; Take 1 tablet by mouth daily.  Dispense: 30 tablet; Refill: 2 - CBC with Differential/Platelet; Future - Comp. Metabolic Panel (12); Future  2. Cervical radiculopathy Resume regimen Note from neurosurgery 03/13/20 visit Imaging: Cervical CT scan dated 03/07/20 reviewed showing multi-level cervical spondylosis resulting in diffuse neural foraminal encroachment but difficult to determine amount of foraminal stenosis without contrast  Assessment:  Assessment  1. Cervical radiculopathy MR SPINE CERVICAL WO CONTRAST  2. Left upper extremity numbness NCV/EMG/Ultrasound  3. Cervical spondylosis    Plan:  Plan  Presentation c/w cervical radiculopathy vs peripheral neuropathy. Patient's grip strength is weaker on left. He has failed to improve with >3 months of conservative treatments including physician guided HEP, PO medications, and time. Will order cervical MRI to better evaluate cervical nerve impingement and EMG to exclude peripheral origin. HEP again provided today. F/u with me to review results, in person vs CPV based on pt's preference    - gabapentin (NEURONTIN) 800 MG tablet; Take 1 tablet (800 mg total) by mouth 2 (two) times daily.  Dispense: 60 tablet; Refill: 1 - Vitamin D, 25-hydroxy; Future  3. Left upper extremity numbness  - gabapentin (NEURONTIN) 800 MG tablet; Take 1 tablet (800 mg total) by mouth 2 (two) times daily.  Dispense: 60 tablet; Refill: 1  4. Cervical spondylolysis  - gabapentin (NEURONTIN) 800 MG tablet; Take 1 tablet (800 mg total) by mouth 2 (two) times daily.  Dispense: 60 tablet; Refill: 1  5. Screening for metabolic disorder  - Lipid panel; Future - TSH; Future  6. Screening PSA  (prostate specific antigen)  - PSA; Future  7. Screening for HIV without presence of risk factors  - HIV antibody (with reflex); Future    I have reviewed the patient's medical history (PMH, PSH, Social History, Family History, Medications, and allergies) , and have been updated if relevant. I spent 30 minutes reviewing chart and  face to face time with patient.     Follow-up: Return in about 2 weeks (around 05/10/2020) for Fasting  labs.   Kasandra Knudsen Mayers, PA-C

## 2020-05-04 ENCOUNTER — Ambulatory Visit: Payer: No Typology Code available for payment source

## 2020-05-08 ENCOUNTER — Telehealth: Payer: Self-pay

## 2020-05-08 NOTE — Telephone Encounter (Signed)
Contacted patient by phone 2x's to share our updated location time for upcoming appt with [facility]. ULVM for patient.

## 2020-05-11 ENCOUNTER — Ambulatory Visit: Payer: No Typology Code available for payment source

## 2020-05-31 ENCOUNTER — Encounter: Payer: No Typology Code available for payment source | Admitting: Family

## 2020-05-31 NOTE — Progress Notes (Signed)
Patient did not show for appointment.   

## 2020-07-09 ENCOUNTER — Other Ambulatory Visit: Payer: Self-pay

## 2020-07-09 ENCOUNTER — Inpatient Hospital Stay (HOSPITAL_COMMUNITY)
Admission: EM | Admit: 2020-07-09 | Discharge: 2020-07-12 | DRG: 065 | Disposition: A | Discharge status: Home / Self Care | Payer: Medicaid Other | Attending: Internal Medicine | Admitting: Internal Medicine

## 2020-07-09 ENCOUNTER — Emergency Department (HOSPITAL_COMMUNITY): Payer: Medicaid Other

## 2020-07-09 ENCOUNTER — Encounter (HOSPITAL_COMMUNITY): Payer: Self-pay | Admitting: Emergency Medicine

## 2020-07-09 DIAGNOSIS — I63511 Cerebral infarction due to unspecified occlusion or stenosis of right middle cerebral artery: Secondary | ICD-10-CM

## 2020-07-09 DIAGNOSIS — Z720 Tobacco use: Secondary | ICD-10-CM | POA: Diagnosis present

## 2020-07-09 DIAGNOSIS — Z8249 Family history of ischemic heart disease and other diseases of the circulatory system: Secondary | ICD-10-CM

## 2020-07-09 DIAGNOSIS — I10 Essential (primary) hypertension: Secondary | ICD-10-CM | POA: Diagnosis present

## 2020-07-09 DIAGNOSIS — E785 Hyperlipidemia, unspecified: Secondary | ICD-10-CM | POA: Diagnosis present

## 2020-07-09 DIAGNOSIS — M4722 Other spondylosis with radiculopathy, cervical region: Secondary | ICD-10-CM | POA: Diagnosis present

## 2020-07-09 DIAGNOSIS — Z8673 Personal history of transient ischemic attack (TIA), and cerebral infarction without residual deficits: Secondary | ICD-10-CM | POA: Diagnosis present

## 2020-07-09 DIAGNOSIS — I63411 Cerebral infarction due to embolism of right middle cerebral artery: Principal | ICD-10-CM | POA: Diagnosis present

## 2020-07-09 DIAGNOSIS — I454 Nonspecific intraventricular block: Secondary | ICD-10-CM | POA: Diagnosis present

## 2020-07-09 DIAGNOSIS — Z20822 Contact with and (suspected) exposure to covid-19: Secondary | ICD-10-CM | POA: Diagnosis present

## 2020-07-09 DIAGNOSIS — Z79899 Other long term (current) drug therapy: Secondary | ICD-10-CM

## 2020-07-09 DIAGNOSIS — R29810 Facial weakness: Secondary | ICD-10-CM | POA: Diagnosis present

## 2020-07-09 DIAGNOSIS — I69354 Hemiplegia and hemiparesis following cerebral infarction affecting left non-dominant side: Secondary | ICD-10-CM

## 2020-07-09 DIAGNOSIS — H547 Unspecified visual loss: Secondary | ICD-10-CM | POA: Diagnosis present

## 2020-07-09 DIAGNOSIS — I639 Cerebral infarction, unspecified: Secondary | ICD-10-CM | POA: Diagnosis not present

## 2020-07-09 DIAGNOSIS — M4802 Spinal stenosis, cervical region: Secondary | ICD-10-CM | POA: Diagnosis present

## 2020-07-09 DIAGNOSIS — R29705 NIHSS score 5: Secondary | ICD-10-CM | POA: Diagnosis present

## 2020-07-09 DIAGNOSIS — F17210 Nicotine dependence, cigarettes, uncomplicated: Secondary | ICD-10-CM | POA: Diagnosis present

## 2020-07-09 LAB — CBC
HCT: 40.4 % (ref 39.0–52.0)
Hemoglobin: 13.6 g/dL (ref 13.0–17.0)
MCH: 31.8 pg (ref 26.0–34.0)
MCHC: 33.7 g/dL (ref 30.0–36.0)
MCV: 94.4 fL (ref 80.0–100.0)
Platelets: 231 10*3/uL (ref 150–400)
RBC: 4.28 MIL/uL (ref 4.22–5.81)
RDW: 11.7 % (ref 11.5–15.5)
WBC: 7.8 10*3/uL (ref 4.0–10.5)
nRBC: 0 % (ref 0.0–0.2)

## 2020-07-09 LAB — COMPREHENSIVE METABOLIC PANEL
ALT: 16 U/L (ref 0–44)
AST: 28 U/L (ref 15–41)
Albumin: 3.6 g/dL (ref 3.5–5.0)
Alkaline Phosphatase: 107 U/L (ref 38–126)
Anion gap: 8 (ref 5–15)
BUN: 17 mg/dL (ref 6–20)
CO2: 28 mmol/L (ref 22–32)
Calcium: 8.9 mg/dL (ref 8.9–10.3)
Chloride: 102 mmol/L (ref 98–111)
Creatinine, Ser: 1.28 mg/dL — ABNORMAL HIGH (ref 0.61–1.24)
GFR, Estimated: >60 mL/min (ref >60)
Glucose, Bld: 134 mg/dL — ABNORMAL HIGH (ref 70–99)
Potassium: 3.3 mmol/L — ABNORMAL LOW (ref 3.5–5.1)
Sodium: 138 mmol/L (ref 135–145)
Total Bilirubin: 0.8 mg/dL (ref 0.3–1.2)
Total Protein: 7 g/dL (ref 6.5–8.1)

## 2020-07-09 LAB — CBG MONITORING, ED: Glucose-Capillary: 168 mg/dL — ABNORMAL HIGH (ref 70–99)

## 2020-07-09 LAB — DIFFERENTIAL
Abs Immature Granulocytes: 0.02 10*3/uL (ref 0.00–0.07)
Basophils Absolute: 0 10*3/uL (ref 0.0–0.1)
Basophils Relative: 0 %
Eosinophils Absolute: 0 10*3/uL (ref 0.0–0.5)
Eosinophils Relative: 1 %
Immature Granulocytes: 0 %
Lymphocytes Relative: 22 %
Lymphs Abs: 1.7 10*3/uL (ref 0.7–4.0)
Monocytes Absolute: 0.4 10*3/uL (ref 0.1–1.0)
Monocytes Relative: 5 %
Neutro Abs: 5.6 10*3/uL (ref 1.7–7.7)
Neutrophils Relative %: 72 %

## 2020-07-09 LAB — APTT: aPTT: 32 seconds (ref 24–36)

## 2020-07-09 LAB — PROTIME-INR
INR: 1 (ref 0.8–1.2)
Prothrombin Time: 13.2 seconds (ref 11.4–15.2)

## 2020-07-09 LAB — ETHANOL: Alcohol, Ethyl (B): <10 mg/dL (ref <10)

## 2020-07-09 IMAGING — CT CT HEAD W/O CM
4 series · 17 of 47 positions shown, 19 images · non-contrast
Comparison: Prior CT from [DATE].

CLINICAL DATA: Initial evaluation for acute mental status change,
stroke suspected. Left-sided numbness.

EXAM:
CT HEAD WITHOUT CONTRAST
TECHNIQUE: Contiguous axial images were obtained from the base of the skull
through the vertex without intravenous contrast.

[Series 3: head wo · axial · 0.49mm/px · z∈[+16,+146]mm · 7 of 36 slices shown, 9 images]
[im 5/36  brain]
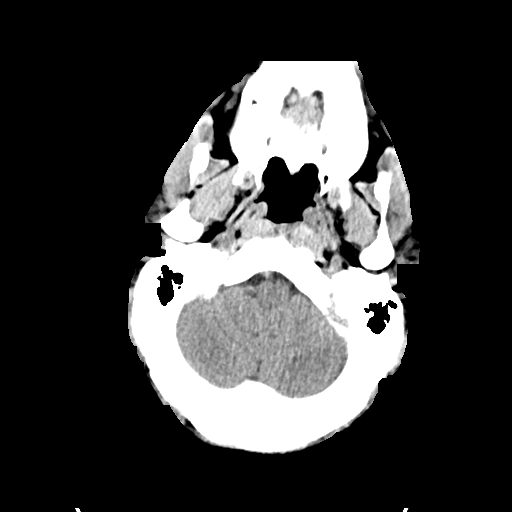
[im 5/36  bone]
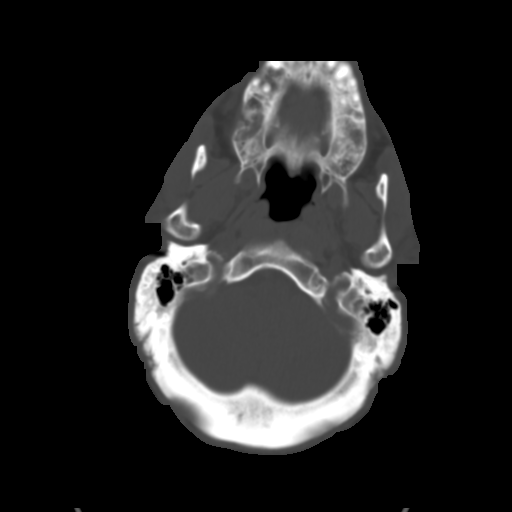
[im 9/36  brain]
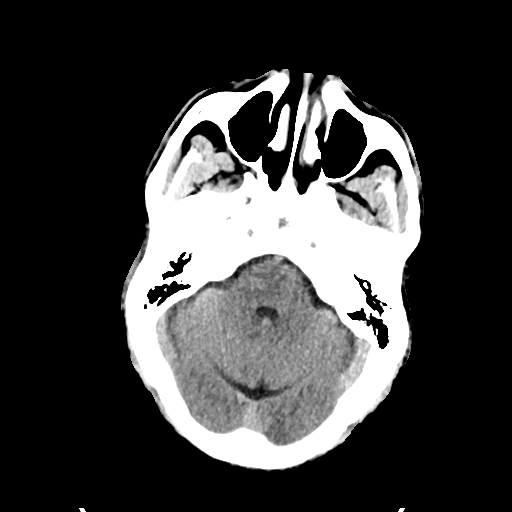
[im 14/36  brain]
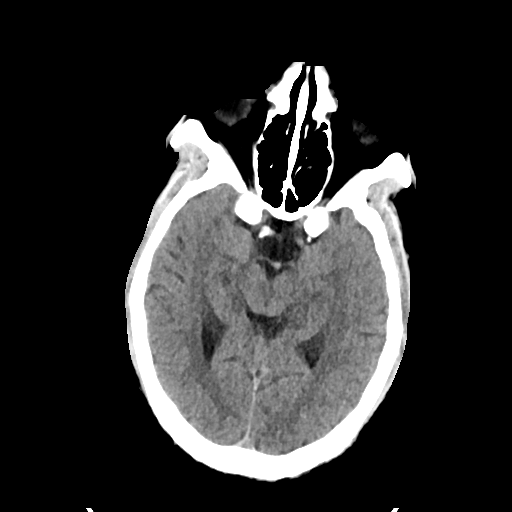
[im 18/36  brain]
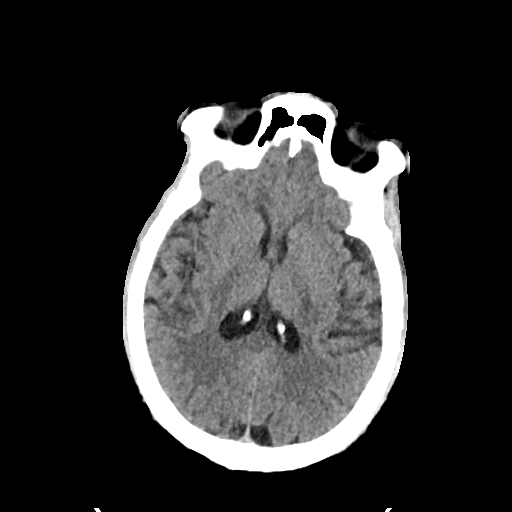
[im 22/36  brain]
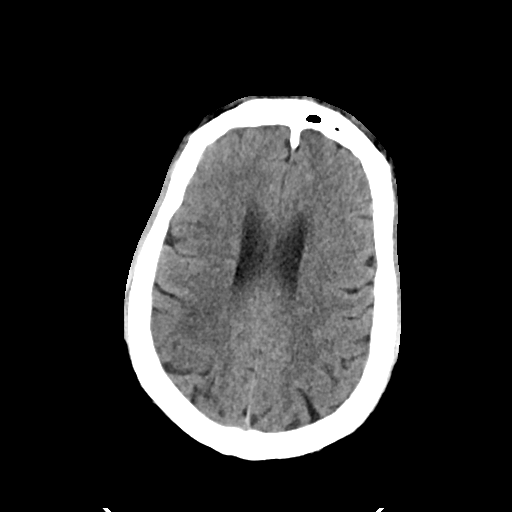
[im 22/36  bone]
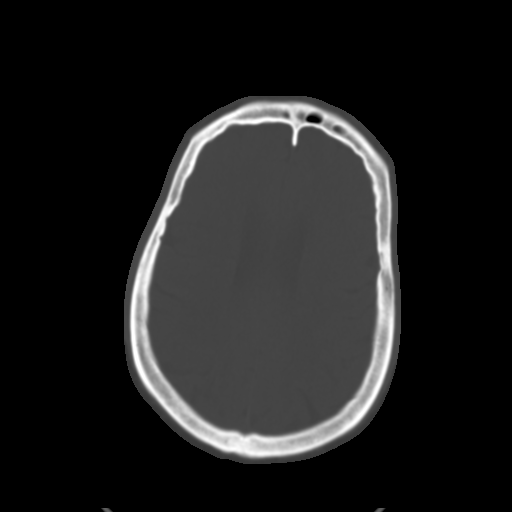
[im 27/36  brain]
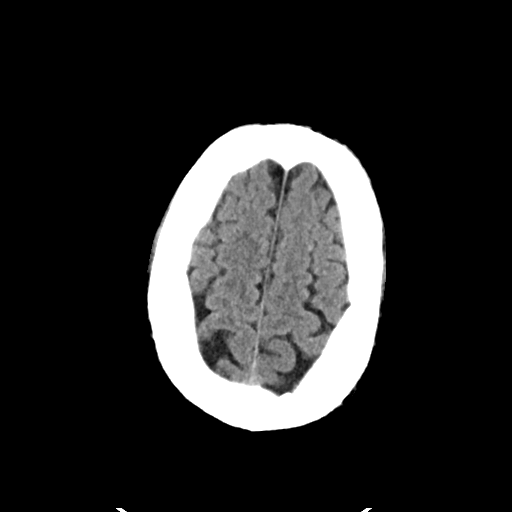
[im 31/36  brain]
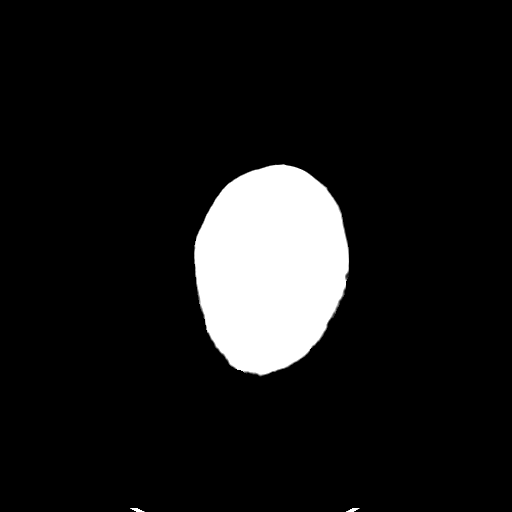

[Series 4: head bone · axial · 0.49mm/px · z∈[+12,+76]mm · 4 of 90 slices shown]
[im 9/90  bone]
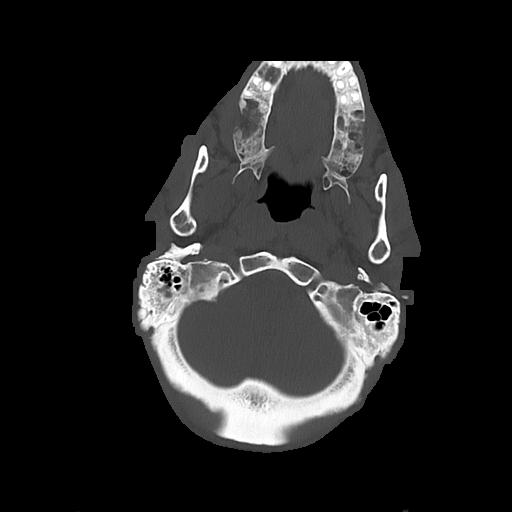
[im 18/90  bone]
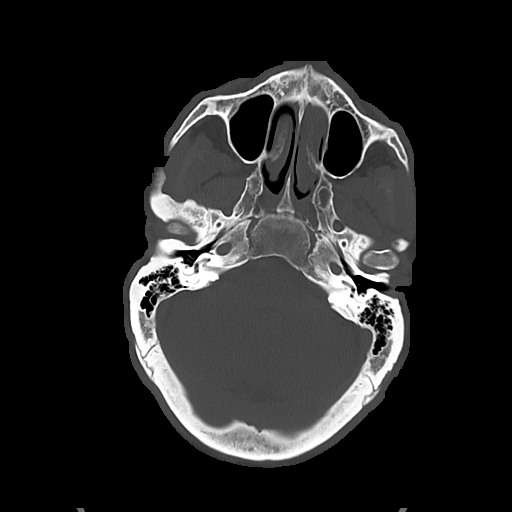
[im 27/90  bone]
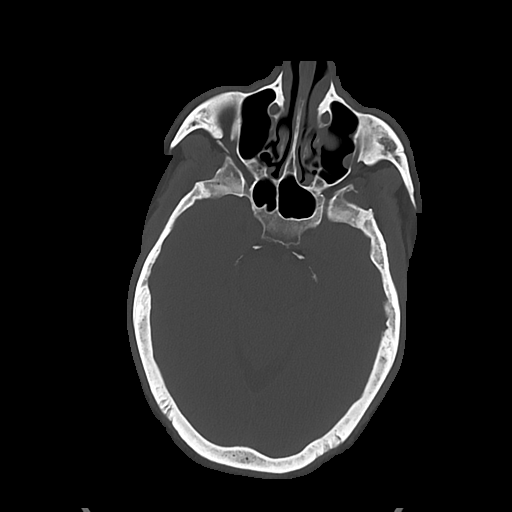
[im 41/90  bone]
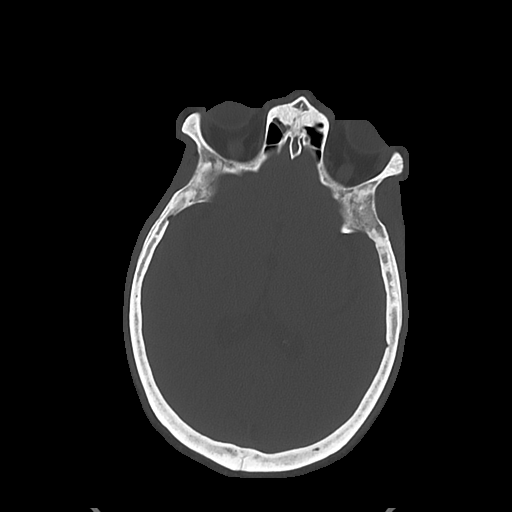

[Series 5: cor soft · coronal · 0.38mm/px · 3 of 76 slices shown]
[im 26/76  brain]
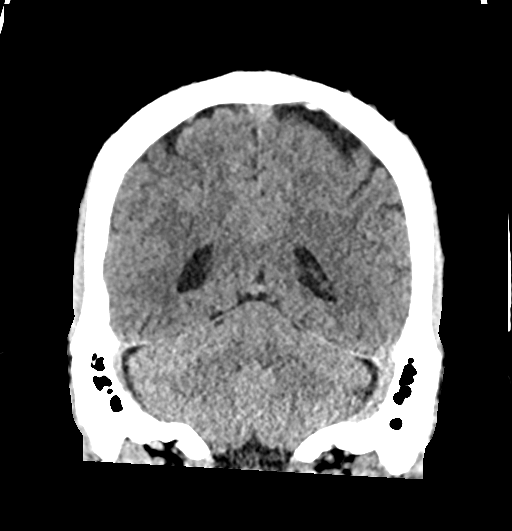
[im 34/76  brain]
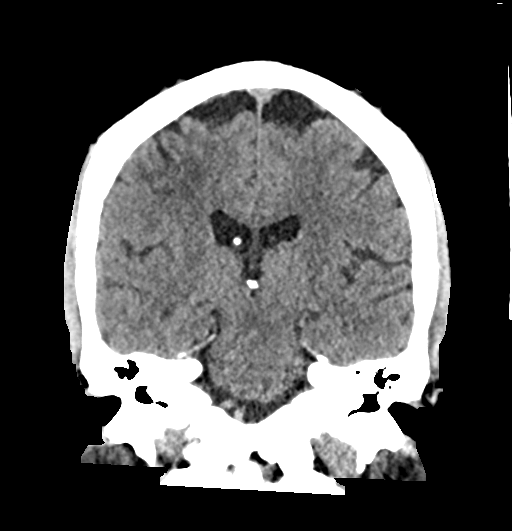
[im 42/76  brain]
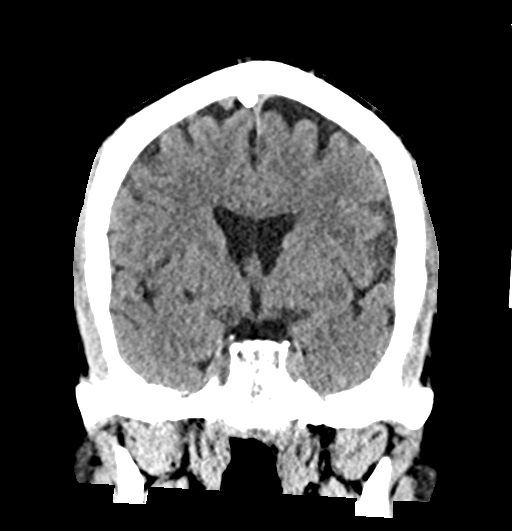

[Series 6: sag soft · sagittal · 0.39mm/px · 3 of 64 slices shown]
[im 22/64  brain]
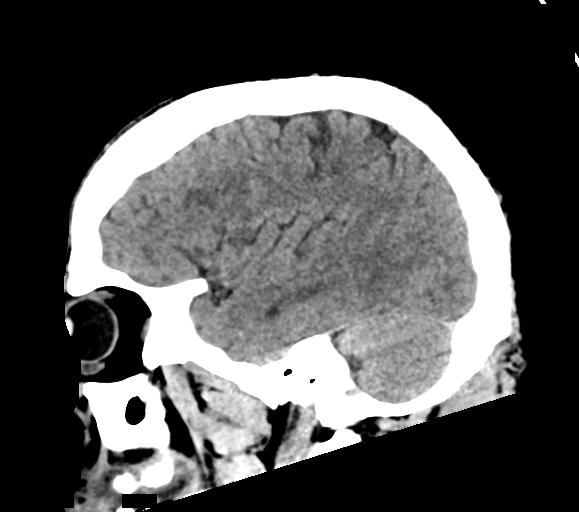
[im 32/64  brain]
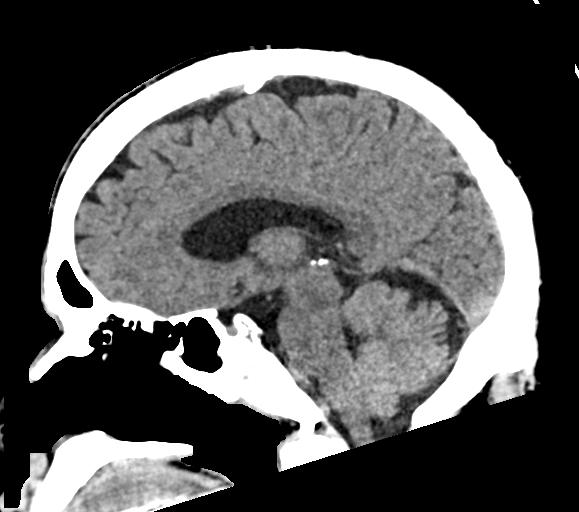
[im 43/64  brain]
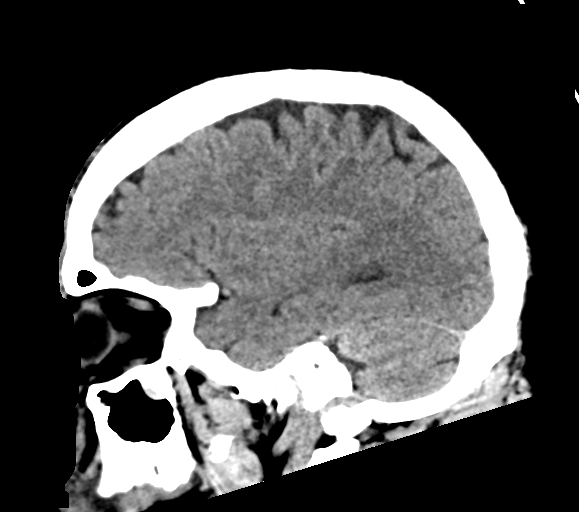

[17 of 47 positions shown; findings below may reference images not displayed]

FINDINGS: Brain: Cerebral volume within normal limits for age. Subtle
hypodensity seen involving the mid and posterior right
frontoparietal region, concerning for acute right MCA distribution
infarct. Mild patchy involvement of the right insula. No
intracranial hemorrhage. No other acute large vessel territory
infarct. No mass lesion, midline shift or mass effect. No
hydrocephalus or extra-axial fluid collection.

Vascular: No visible hyperdense vessel.

Skull: Scalp soft tissues and calvarium within normal limits.

Sinuses/Orbits: Globes and orbital soft tissues demonstrate no acute
finding. Question mild proptosis. Few small retention cyst noted
within the left maxillary sinus. Paranasal sinuses are otherwise
clear. No mastoid effusion.

Other: None.
IMPRESSION: 1. Subtle hypodensity involving the mid and posterior right
frontoparietal region, concerning for acute right MCA distribution
infarct. No intracranial hemorrhage.
2. Question mild proptosis. Correlation with physical exam
recommended.

Results communicated by telephone at the time of interpretation on
[DATE] at [DATE] to provider NORHIDAYAH , who verbally
acknowledged these results.

## 2020-07-09 IMAGING — CT CT ANGIO NECK
2 of 7 series · 8 of 33 positions shown · IV contrast (APPLIED)
Comparison: Prior head CT from earlier the same day.

CLINICAL DATA: Initial evaluation for acute stroke.

EXAM:
CT ANGIOGRAPHY HEAD AND NECK
TECHNIQUE: Multidetector CT imaging of the head and neck was performed using
the standard protocol during bolus administration of intravenous
contrast. Multiplanar CT image reconstructions and MIPs were
obtained to evaluate the vascular anatomy. Carotid stenosis
measurements (when applicable) are obtained utilizing NASCET
criteria, using the distal internal carotid diameter as the
denominator.
CONTRAST:  50mL OMNIPAQUE IOHEXOL 350 MG/ML SOLN

[Series 5: cta neck/head · axial · 0.62mm/px · z∈[-273,-145]mm · 2 of 192 slices shown]
[im 64/192  soft-tissue]
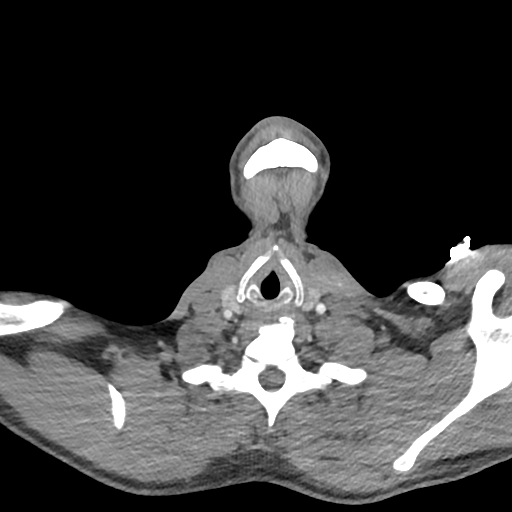
[im 128/192  soft-tissue]
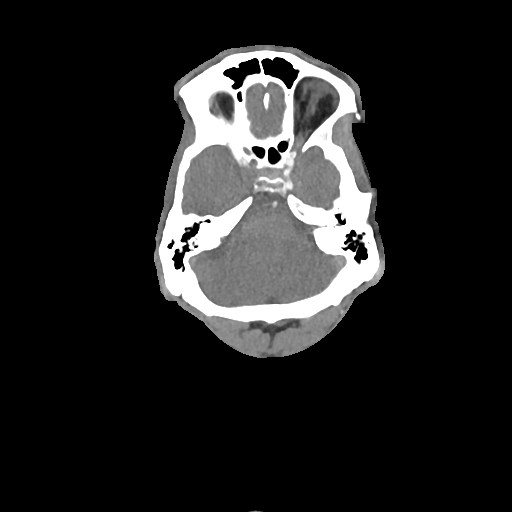

[Series 7: ax thins · axial · 0.39mm/px · z∈[-344,-71]mm · 6 of 383 slices shown]
[im 55/383  soft-tissue]
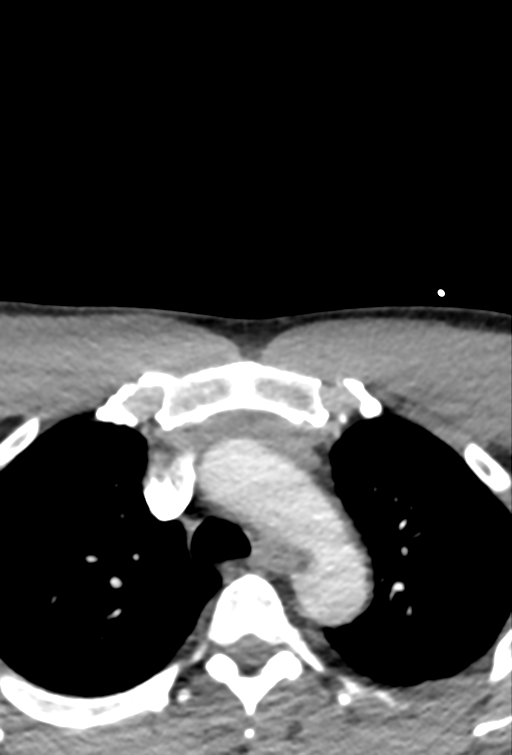
[im 110/383  bone]
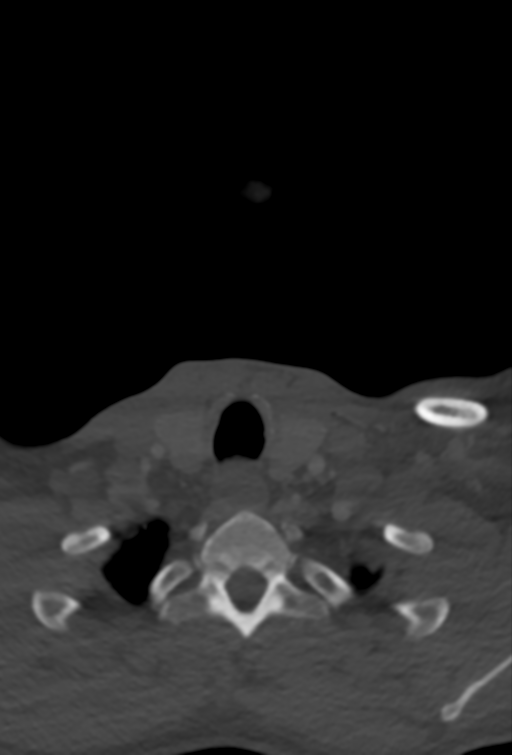
[im 164/383  soft-tissue]
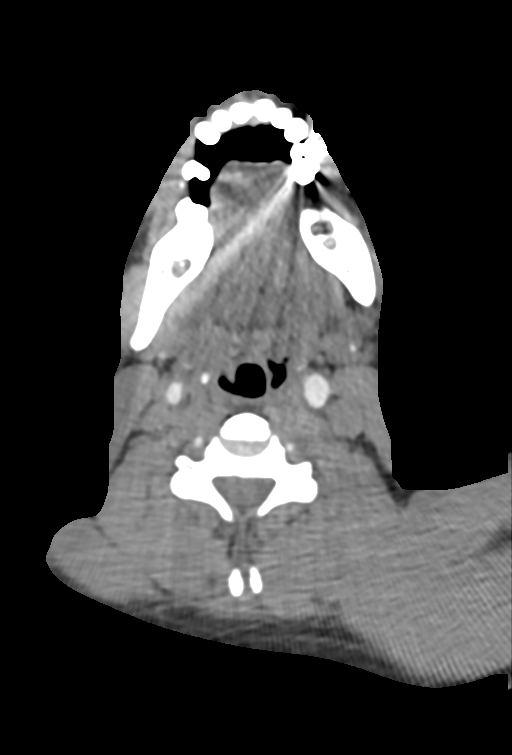
[im 219/383  bone]
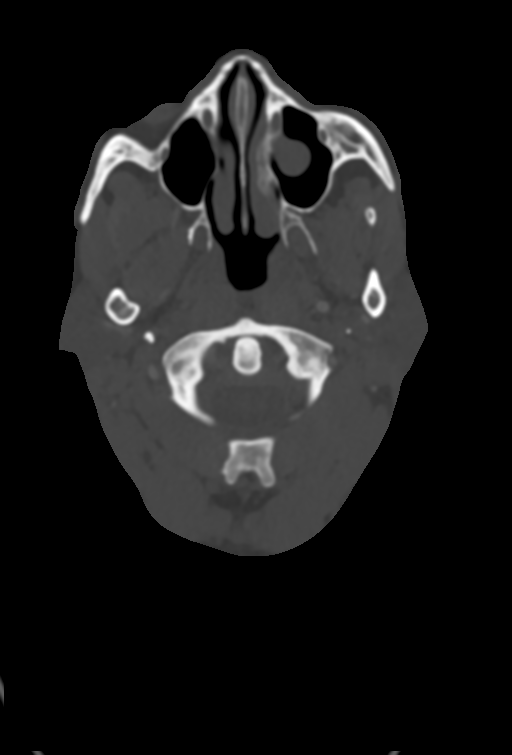
[im 273/383  soft-tissue]
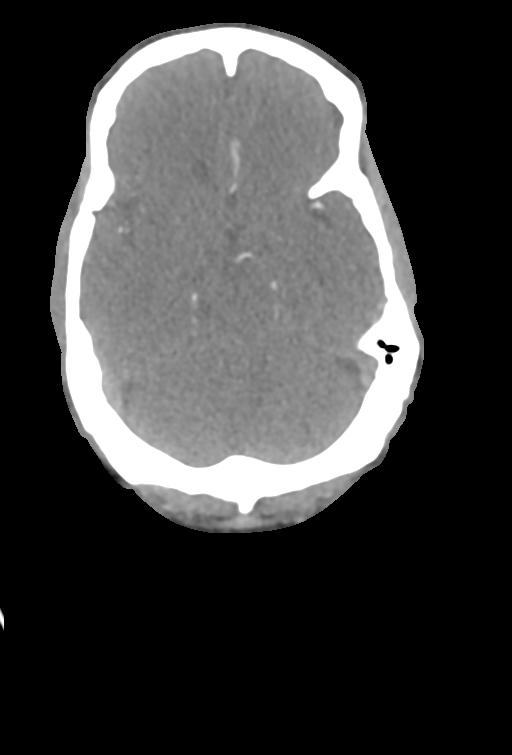
[im 328/383  bone]
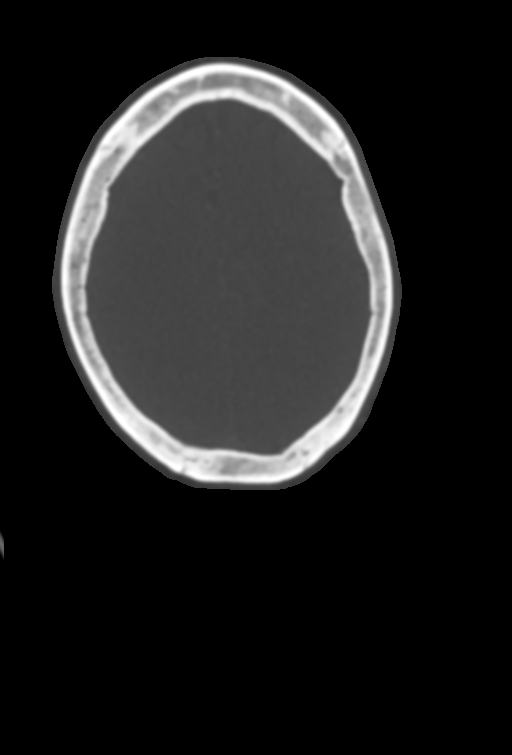

[8 of 33 positions shown; findings below may reference images not displayed]

FINDINGS: CTA NECK FINDINGS

Aortic arch: Examination somewhat technically limited by timing of
the contrast bolus.

Visualized aortic arch normal in caliber with normal 3 vessel
morphology. No hemodynamically significant stenosis seen about the
origin of the great vessels. Mild eccentric soft plaque noted within
the distal aspect of the aortic arch itself.

Right carotid system: Right CCA patent from its origin to the
bifurcation without stenosis. There is abrupt occlusion of the right
ICA just distal to the bifurcation (series 7, image 202). Right ICA
remains occluded within the neck.

Left carotid system: Left common carotid artery patent from its
origin to the bifurcation without stenosis. Mild atheromatous
irregularity about the left carotid bulb/proximal left ICA without
significant stenosis. Left ICA patent distally without stenosis,
dissection or occlusion.

Vertebral arteries: Both vertebral arteries arise from the
subclavian arteries. No proximal subclavian artery stenosis.
Vertebral arteries patent without stenosis, dissection or occlusion.

Skeleton: No visible acute osseous abnormality. No discrete or
worrisome osseous lesions.

Other neck: No other acute soft tissue abnormality within the neck.
No mass or adenopathy.

Upper chest: Visualized upper chest demonstrates no acute finding.
Hypodensity noted within the distal left main pulmonary artery
(series 5, image 180), favored to be artifactual and/or mixing
artifact.

Review of the MIP images confirms the above findings

CTA HEAD FINDINGS

Anterior circulation: Left internal carotid artery widely patent to
the terminus without stenosis. Right ICA occluded at the skull base.
Distal reconstitution at the supraclinoid segment via collateral
flow across the circle-of-Willis. Left A1 segment widely patent.
There is a focal severe distal right A1 stenosis (series 8, image
113). Normal anterior communicating artery complex. Anterior
cerebral arteries remain well perfused to their distal aspects.

Mildly attenuated flow within the right MCA as compared to the left.
Right M1 segment remains perfused without stenosis. Normal right MCA
bifurcation. Distal right MCA branches well perfused without visible
downstream occlusion.

Left M1 segment widely patent. Normal left MCA bifurcation. Distal
left MCA branches well perfused.

Posterior circulation: Both V4 segments patent to the
vertebrobasilar junction without stenosis. Neither PICA origin well
visualized. Focal hypodensity/irregularity present at the level of
the vertebrobasilar junction (series 7, image 138). There is a
suspected short-segment fenestration at this level, with possible
underlying severe stenosis and/or artifact. A small amount of
intraluminal thrombus somewhat difficult to exclude. The basilar
artery is diminutive but patent distally. Superior cerebellar
arteries grossly patent proximally. Both PCAs primarily supplied via
the basilar. PCAs are perfused to their distal aspects without
appreciable stenosis.

Venous sinuses: Grossly patent allowing for timing the contrast
bolus, although evaluation fairly limited.

Anatomic variants: None significant.

Review of the MIP images confirms the above findings
IMPRESSION: 1. Abrupt occlusion of the right ICA just distal to the bifurcation.
Distal reconstitution at the supraclinoid segment via collateral
flow across the circle-of-Willis. Attenuated but patent flow within
the right MCA distribution, with no visible downstream occlusion.
2. Focal hypodensity/irregularity at the level of the
vertebrobasilar junction, indeterminate, but favored to reflect a
short-segment fenestration with artifact or possible stenosis at
this level. A small amount of intraluminal thrombus difficult to
exclude, but felt to be less likely. If clinical symptoms should
warrant, a repeat examination could be performed for further
evaluation as needed.
3. Severe distal right A1 stenosis.

Critical Value/emergent results were called by telephone at the time
TORES , who verbally acknowledged these results.

## 2020-07-09 MED ORDER — SODIUM CHLORIDE 0.9 % IV BOLUS
1000.0000 mL | Freq: Once | INTRAVENOUS | Status: AC
  Administered 2020-07-10: 1000 mL via INTRAVENOUS

## 2020-07-09 MED ORDER — ASPIRIN 325 MG PO TABS
325.0000 mg | ORAL_TABLET | Freq: Once | ORAL | Status: AC
  Administered 2020-07-09: 325 mg via ORAL
  Filled 2020-07-09: qty 1

## 2020-07-09 MED ORDER — CLOPIDOGREL BISULFATE 75 MG PO TABS
75.0000 mg | ORAL_TABLET | Freq: Every day | ORAL | Status: DC
  Filled 2020-07-09: qty 1

## 2020-07-09 MED ORDER — CLOPIDOGREL BISULFATE 300 MG PO TABS
300.0000 mg | ORAL_TABLET | Freq: Once | ORAL | Status: AC
  Administered 2020-07-09: 300 mg via ORAL
  Filled 2020-07-09: qty 1

## 2020-07-09 MED ORDER — IOHEXOL 350 MG/ML SOLN
100.0000 mL | Freq: Once | INTRAVENOUS | Status: AC | PRN
  Administered 2020-07-09: 50 mL via INTRAVENOUS

## 2020-07-09 MED ORDER — SODIUM CHLORIDE 0.9 % IV SOLN: INTRAVENOUS | Status: DC

## 2020-07-09 NOTE — H&P (Signed)
History and Physical    Robert [name] GQQ:[id number] DOB: May 26, 1965 DOA: 07/09/2020  PCP: Pcp, No  Patient coming from: Home  I have personally briefly reviewed patient's old medical records in [facility]  Chief Complaint: Intermittent left hand weakness  HPI: Robert [name] is a 55 y.o. male with medical history significant for hypertension and tobacco use who presents to the ED for evaluation of intermittent left hand weakness.  Patient states he has chronic intermittent left hand weakness and neck pain which she has attributed to a slipped disc in his neck/cervical radiculopathy.  He was planned to have outpatient MRI of the cervical spine after seen by neurosurgery at [facility] in January however this was not completed.  He says about 5-6 days ago he began to have significant weakness in his left hand with associated left facial numbness and decreased vision.  He is unable to grip/squeeze or write with his left hand. He has also had left arm pain and numbness.  He is a chronic smoker of 1 pack/day.  He denies any alcohol or illicit drug use.  He reports a history of stroke in his maternal grandmother.  ED Course:  Initial vitals showed BP 131/91, pulse 85, RR 18, temp 98.9 F, SPO2 99% on room air.  Labs show sodium 138, potassium 3.3, bicarb 28, BUN 17, creatinine 1.28, serum glucose 134, LFTs within normal limits, WBC 7.8, hemoglobin 13.6, platelets 231,000, INR 1.0, serum ethanol undetectable.  CT head without contrast showed subtle hypodensity involving the mid and posterior right frontoparietal region concerning for acute right MCA distribution infarct.  No intracranial hemorrhage.  Neurology were consulted.  Patient was given aspirin 325 mg and Plavix 300 mg.  CTA head and neck were obtained and showed right ICA occlusion with distal reconstitution with collateral flow across the circle of Willis.  Fenestration at the vertebrobasilar junction versus focal  stenosis noted.  Severe distal right A1 stenosis noted.  Neurology has recommended admission for further stroke work-up including MRI C-spine, IV fluids, and keeping head of bed flat.  The hospitalist service was consulted to admit for further evaluation and management.  Review of Systems: All systems reviewed and are negative except as documented in history of present illness above.   Past Medical History:  Diagnosis Date  . Hypertension     Past Surgical History:  Procedure Laterality Date  . TONSILLECTOMY AND ADENOIDECTOMY      Social History:  reports that he has been smoking cigarettes. He has been smoking about 0.50 packs per day. He has never used smokeless tobacco. He reports current alcohol use. He reports that he does not use drugs.  No Known Allergies  Family History  Problem Relation Age of Onset  . Hypertension Mother   . Hypertension Father   . Hypertension Brother      Prior to Admission medications   Medication Sig Start Date End Date Taking? Authorizing Provider  amLODipine (NORVASC) 10 MG tablet Take 1 tablet (10 mg total) by mouth daily. 04/26/20 07/25/20 Yes Mayers, Cari S, PA-C  gabapentin (NEURONTIN) 800 MG tablet Take 1 tablet (800 mg total) by mouth 2 (two) times daily. 04/26/20  Yes Mayers, Cari S, PA-C  lisinopril-hydrochlorothiazide (ZESTORETIC) 20-12.5 MG tablet Take 1 tablet by mouth daily. 04/26/20  Yes Mayers, Cari S, PA-C  amLODipine (NORVASC) 10 MG tablet TAKE 1 TABLET BY MOUTH DAILY. Patient taking differently: Take 10 mg by mouth daily. 04/26/20 04/26/21  Mayers, Cari S, PA-C  Blood  Pressure Monitoring (PREMIUM AUTOMATIC BP MONITOR) DEVI 1 Device by Does not apply route daily. Patient not taking: No sig reported 11/04/14   Weber, Sarah L, PA-C  gabapentin (NEURONTIN) 800 MG tablet TAKE 1 TABLET (800 MG TOTAL) BY MOUTH 2 (TWO) TIMES DAILY. Patient taking differently: Take 800 mg by mouth 2 (two) times daily. 04/26/20 04/26/21  Mayers, Cari S, PA-C   lisinopril-hydrochlorothiazide (ZESTORETIC) 20-12.5 MG tablet TAKE 1 TABLET BY MOUTH DAILY. 04/26/20 04/26/21  Mayers, Kasandra Knudsen, PA-C    Physical Exam: Vitals:   07/09/20 1958 07/09/20 2145 07/09/20 2200 07/09/20 2215  BP: (!) 131/91 (!) 141/95 (!) 151/96   Pulse: 85 (!) 54 (!) 48 (!) 58  Resp: 18 15 (!) 23 16  Temp: 98.9 F (37.2 C)     TempSrc: Oral     SpO2: 99% 100% 99% 100%   Constitutional: Resting in bed, NAD, calm, comfortable Eyes: PERRL, EOMI, lids and conjunctivae normal ENMT: Mucous membranes are moist. Posterior pharynx clear of any exudate or lesions.Normal dentition.  Neck: normal, supple, no masses. Respiratory: clear to auscultation bilaterally, no wheezing, no crackles. Normal respiratory effort. No accessory muscle use.  Cardiovascular: Borderline bradycardia with regular rhythm, no murmurs / rubs / gallops. No extremity edema. 2+ pedal pulses. Abdomen: no tenderness, no masses palpated. No hepatosplenomegaly. Bowel sounds positive.  Musculoskeletal: no clubbing / cyanosis. No joint deformity upper and lower extremities. No contractures. Normal muscle tone.  Skin: no rashes, lesions, ulcers. No induration Neurologic: CN 2-12 grossly intact. Sensation diminished LUE to touch. Strength 5/5 in right upper and lower extremity, 4/5 left lower extremity, 1/5 at the left shoulder and 0/5 with left hand grip. Psychiatric: Normal judgment and insight. Alert and oriented x 3. Normal mood.   Labs on Admission: I have personally reviewed following labs and imaging studies  CBC: Recent Labs  Lab 07/09/20 2023  WBC 7.8  NEUTROABS 5.6  HGB 13.6  HCT 40.4  MCV 94.4  PLT 231   Basic Metabolic Panel: Recent Labs  Lab 07/09/20 2023  NA 138  K 3.3*  CL 102  CO2 28  GLUCOSE 134*  BUN 17  CREATININE 1.28*  CALCIUM 8.9   GFR: CrCl cannot be calculated (Unknown ideal weight.). Liver Function Tests: Recent Labs  Lab 07/09/20 2023  AST 28  ALT 16  ALKPHOS 107   BILITOT 0.8  PROT 7.0  ALBUMIN 3.6   No results for input(s): LIPASE, AMYLASE in the last 168 hours. No results for input(s): AMMONIA in the last 168 hours. Coagulation Profile: Recent Labs  Lab 07/09/20 2023  INR 1.0   Cardiac Enzymes: No results for input(s): CKTOTAL, CKMB, CKMBINDEX, TROPONINI in the last 168 hours. BNP (last 3 results) No results for input(s): PROBNP in the last 8760 hours. HbA1C: No results for input(s): HGBA1C in the last 72 hours. CBG: Recent Labs  Lab 07/09/20 2000  GLUCAP 168*   Lipid Profile: No results for input(s): CHOL, HDL, LDLCALC, TRIG, CHOLHDL, LDLDIRECT in the last 72 hours. Thyroid Function Tests: No results for input(s): TSH, T4TOTAL, FREET4, T3FREE, THYROIDAB in the last 72 hours. Anemia Panel: No results for input(s): VITAMINB12, FOLATE, FERRITIN, TIBC, IRON, RETICCTPCT in the last 72 hours. Urine analysis:    Component Value Date/Time   BILIRUBINUR small 11/04/2014 1923   PROTEINUR trace 11/04/2014 1923   UROBILINOGEN 1.0 11/04/2014 1923   NITRITE neg 11/04/2014 1923   LEUKOCYTESUR Negative 11/04/2014 1923    Radiological Exams on Admission: CT ANGIO HEAD  W OR WO CONTRAST  Result Date: 07/09/2020 CLINICAL DATA:  Initial evaluation for acute stroke. EXAM: CT ANGIOGRAPHY HEAD AND NECK TECHNIQUE: Multidetector CT imaging of the head and neck was performed using the standard protocol during bolus administration of intravenous contrast. Multiplanar CT image reconstructions and MIPs were obtained to evaluate the vascular anatomy. Carotid stenosis measurements (when applicable) are obtained utilizing NASCET criteria, using the distal internal carotid diameter as the denominator. CONTRAST:  50mL OMNIPAQUE IOHEXOL 350 MG/ML SOLN COMPARISON:  Prior head CT from earlier the same day. FINDINGS: CTA NECK FINDINGS Aortic arch: Examination somewhat technically limited by timing of the contrast bolus. Visualized aortic arch normal in caliber with  normal 3 vessel morphology. No hemodynamically significant stenosis seen about the origin of the great vessels. Mild eccentric soft plaque noted within the distal aspect of the aortic arch itself. Right carotid system: Right CCA patent from its origin to the bifurcation without stenosis. There is abrupt occlusion of the right ICA just distal to the bifurcation (series 7, image 202). Right ICA remains occluded within the neck. Left carotid system: Left common carotid artery patent from its origin to the bifurcation without stenosis. Mild atheromatous irregularity about the left carotid bulb/proximal left ICA without significant stenosis. Left ICA patent distally without stenosis, dissection or occlusion. Vertebral arteries: Both vertebral arteries arise from the subclavian arteries. No proximal subclavian artery stenosis. Vertebral arteries patent without stenosis, dissection or occlusion. Skeleton: No visible acute osseous abnormality. No discrete or worrisome osseous lesions. Other neck: No other acute soft tissue abnormality within the neck. No mass or adenopathy. Upper chest: Visualized upper chest demonstrates no acute finding. Hypodensity noted within the distal left main pulmonary artery (series 5, image 180), favored to be artifactual and/or mixing artifact. Review of the MIP images confirms the above findings CTA HEAD FINDINGS Anterior circulation: Left internal carotid artery widely patent to the terminus without stenosis. Right ICA occluded at the skull base. Distal reconstitution at the supraclinoid segment via collateral flow across the circle-of-Willis. Left A1 segment widely patent. There is a focal severe distal right A1 stenosis (series 8, image 113). Normal anterior communicating artery complex. Anterior cerebral arteries remain well perfused to their distal aspects. Mildly attenuated flow within the right MCA as compared to the left. Right M1 segment remains perfused without stenosis. Normal right  MCA bifurcation. Distal right MCA branches well perfused without visible downstream occlusion. Left M1 segment widely patent. Normal left MCA bifurcation. Distal left MCA branches well perfused. Posterior circulation: Both V4 segments patent to the vertebrobasilar junction without stenosis. Neither PICA origin well visualized. Focal hypodensity/irregularity present at the level of the vertebrobasilar junction (series 7, image 138). There is a suspected short-segment fenestration at this level, with possible underlying severe stenosis and/or artifact. A small amount of intraluminal thrombus somewhat difficult to exclude. The basilar artery is diminutive but patent distally. Superior cerebellar arteries grossly patent proximally. Both PCAs primarily supplied via the basilar. PCAs are perfused to their distal aspects without appreciable stenosis. Venous sinuses: Grossly patent allowing for timing the contrast bolus, although evaluation fairly limited. Anatomic variants: None significant. Review of the MIP images confirms the above findings IMPRESSION: 1. 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 Distal reconstitution at the supraclinoid segment via collateral flow across the circle-of-Willis. Attenuated but patent flow within the right MCA distribution, with no visible downstream occlusion. 2. Focal hypodensity/irregularity at the level of the vertebrobasilar junction, indeterminate, but favored to reflect a short-segment fenestration  with artifact or possible stenosis at this level. A small amount of intraluminal thrombus difficult to exclude, but felt to be less likely. If clinical symptoms should warrant, a repeat examination could be performed for further evaluation as needed. 3. Severe distal right A1 stenosis. Critical Value/emergent results were called by telephone at the time of interpretation on 07/09/2020 at approximately 10:45 pm to provider [facility] , who verbally acknowledged  these results. Electronically Signed   By: Rise Mu M.D.   On: 07/09/2020 23:41   CT HEAD WO CONTRAST  Result Date: 07/09/2020 CLINICAL DATA:  Initial evaluation for acute mental status change, stroke suspected. Left-sided numbness. EXAM: CT HEAD WITHOUT CONTRAST TECHNIQUE: Contiguous axial images were obtained from the base of the skull through the vertex without intravenous contrast. COMPARISON:  Prior CT from 03/07/2020. FINDINGS: Brain: Cerebral volume within normal limits for age. Subtle hypodensity seen involving the mid and posterior right frontoparietal region, concerning for acute right MCA distribution infarct. Mild patchy involvement of the right insula. No intracranial hemorrhage. No other acute large vessel territory infarct. No mass lesion, midline shift or mass effect. No hydrocephalus or extra-axial fluid collection. Vascular: No visible hyperdense vessel. Skull: Scalp soft tissues and calvarium within normal limits. Sinuses/Orbits: Globes and orbital soft tissues demonstrate no acute finding. Question mild proptosis. Few small retention cyst noted within the left maxillary sinus. Paranasal sinuses are otherwise clear. No mastoid effusion. Other: None. IMPRESSION: 1. Subtle hypodensity involving the mid and posterior right frontoparietal region, concerning for acute right MCA distribution infarct. No intracranial hemorrhage. 2. Question mild proptosis. Correlation with physical exam recommended. Results communicated by telephone at the time of interpretation on 07/09/2020 at 9:38 pm to provider [facility] , who verbally acknowledged these results. Electronically Signed   By: Rise Mu M.D.   On: 07/09/2020 21:39   CT ANGIO NECK W OR WO CONTRAST  Result Date: 07/09/2020 CLINICAL DATA:  Initial evaluation for acute stroke. EXAM: CT ANGIOGRAPHY HEAD AND NECK TECHNIQUE: Multidetector CT imaging of the head and neck was performed using the standard protocol during bolus  administration of intravenous contrast. Multiplanar CT image reconstructions and MIPs were obtained to evaluate the vascular anatomy. Carotid stenosis measurements (when applicable) are obtained utilizing NASCET criteria, using the distal internal carotid diameter as the denominator. CONTRAST:  8mL OMNIPAQUE IOHEXOL 350 MG/ML SOLN COMPARISON:  Prior head CT from earlier the same day. FINDINGS: CTA NECK FINDINGS Aortic arch: Examination somewhat technically limited by timing of the contrast bolus. Visualized aortic arch normal in caliber with normal 3 vessel morphology. No hemodynamically significant stenosis seen about the origin of the great vessels. Mild eccentric soft plaque noted within the distal aspect of the aortic arch itself. Right carotid system: Right CCA patent from its origin to the bifurcation without stenosis. There is abrupt occlusion of the right ICA just distal to the bifurcation (series 7, image 202). Right ICA remains occluded within the neck. Left carotid system: Left common carotid artery patent from its origin to the bifurcation without stenosis. Mild atheromatous irregularity about the left carotid bulb/proximal left ICA without significant stenosis. Left ICA patent distally without stenosis, dissection or occlusion. Vertebral arteries: Both vertebral arteries arise from the subclavian arteries. No proximal subclavian artery stenosis. Vertebral arteries patent without stenosis, dissection or occlusion. Skeleton: No visible acute osseous abnormality. No discrete or worrisome osseous lesions. Other neck: No other acute soft tissue abnormality within the neck. No mass or adenopathy. Upper chest: Visualized upper chest demonstrates no  acute finding. Hypodensity noted within the distal left main pulmonary artery (series 5, image 180), favored to be artifactual and/or mixing artifact. Review of the MIP images confirms the above findings CTA HEAD FINDINGS Anterior circulation: Left internal carotid  artery widely patent to the terminus without stenosis. Right ICA occluded at the skull base. Distal reconstitution at the supraclinoid segment via collateral flow across the circle-of-Willis. Left A1 segment widely patent. There is a focal severe distal right A1 stenosis (series 8, image 113). Normal anterior communicating artery complex. Anterior cerebral arteries remain well perfused to their distal aspects. Mildly attenuated flow within the right MCA as compared to the left. Right M1 segment remains perfused without stenosis. Normal right MCA bifurcation. Distal right MCA branches well perfused without visible downstream occlusion. Left M1 segment widely patent. Normal left MCA bifurcation. Distal left MCA branches well perfused. Posterior circulation: Both V4 segments patent to the vertebrobasilar junction without stenosis. Neither PICA origin well visualized. Focal hypodensity/irregularity present at the level of the vertebrobasilar junction (series 7, image 138). There is a suspected short-segment fenestration at this level, with possible underlying severe stenosis and/or artifact. A small amount of intraluminal thrombus somewhat difficult to exclude. The basilar artery is diminutive but patent distally. Superior cerebellar arteries grossly patent proximally. Both PCAs primarily supplied via the basilar. PCAs are perfused to their distal aspects without appreciable stenosis. Venous sinuses: Grossly patent allowing for timing the contrast bolus, although evaluation fairly limited. Anatomic variants: None significant. Review of the MIP images confirms the above findings IMPRESSION: 1. 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 Distal reconstitution at the supraclinoid segment via collateral flow across the circle-of-Willis. Attenuated but patent flow within the right MCA distribution, with no visible downstream occlusion. 2. Focal hypodensity/irregularity at the level of the  vertebrobasilar junction, indeterminate, but favored to reflect a short-segment fenestration with artifact or possible stenosis at this level. A small amount of intraluminal thrombus difficult to exclude, but felt to be less likely. If clinical symptoms should warrant, a repeat examination could be performed for further evaluation as needed. 3. Severe distal right A1 stenosis. Critical Value/emergent results were called by telephone at the time of interpretation on 07/09/2020 at approximately 10:45 pm to provider [facility] , who verbally acknowledged these results. Electronically Signed   By: Rise Mu M.D.   On: 07/09/2020 23:41    EKG: Personally reviewed. Sinus rhythm, RBBB.  Not significantly changed compared to previous EKG in 2016.  Assessment/Plan Principal Problem:   Acute CVA (cerebrovascular accident) [facility]) Active Problems:   HTN (hypertension)   Tobacco use   Robert [name] is a 55 y.o. male with medical history significant for hypertension and tobacco use who is admitted with acute right MCA stroke with right ICA occlusion.  Acute right MCA stroke with right ICA occlusion: Seen on CTA head/neck.  Presenting with recurrent and worsened left hand weakness.  Reports intermittent left hand weakness chronically previously felt related to cervical radiculopathy. -Neurology following, appreciate assistance -Admit to progressive care unit per neurology recommendations -MRI brain pending -MRI cervical spine recommended and pending -Echocardiogram -S/p aspirin 325 mg and Plavix 300 mg once -Continue aspirin 81 mg daily and Plavix 75 mg daily for 21 days followed by aspirin 81 mg daily -Keep on telemetry, continue neurochecks -S/p 1 L NS bolus now on continuous IV NS@100  mL/hour -Head of bed flat per neurology -A1c, lipid panel -PT/OT/SLP eval -Allow permissive hypertension up to 220/110, use IV hydralazine as  needed  Hypertension: Holding home amlodipine and  lisinopril-HCTZ to allow permissive hypertension.  Tobacco use: Smoking cessation advised.  DVT prophylaxis: Lovenox Code Status: Full code, confirmed with patient Family Communication: Discussed with patient, he has discussed with family Disposition Plan: From home, dispo pending further stroke work-up Consults called: Neurology Level of care: Progressive Admission status:  Status is: Observation  The patient remains OBS appropriate and will d/c before 2 midnights.  Dispo: The patient is from: Home              Anticipated d/c is to: Home versus SNF versus CIR              Patient currently is not medically stable to d/c.   Darreld Mclean MD [facility]  If 7PM-7AM, please contact night-coverage [email or web address]  07/10/2020, 12:30 AM

## 2020-07-09 NOTE — ED Provider Notes (Signed)
[facility] EMERGENCY DEPARTMENT Provider Note   CSN: [id number] Arrival date & time: 07/09/20  1953     History Chief Complaint  Patient presents with  . Arm Pain  . Altered Mental Status    Robert [name] is a 55 y.o. male. Level 5 caveat due to some mild confusion. HPI Patient presents with left upper extremity weakness and numbness.  Has had some contractions for months or potentially longer.  Reportedly has had some confusion.  Has been doing worse the last few days.  Has been previous seen at [facility] and had negative head CT scan.  Has seen neurosurgery and MRI of cervical spine had been ordered but had not been done.  Patient does not exactly know what is different although states he has more trouble using the left arm.    Past Medical History:  Diagnosis Date  . Hypertension     Patient Active Problem List   Diagnosis Date Noted  . Cervical radiculopathy 04/26/2020  . Left upper extremity numbness 04/26/2020  . 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 HTN (hypertension) 11/04/2014    Past Surgical History:  Procedure Laterality Date  . TONSILLECTOMY AND ADENOIDECTOMY         Family History  Problem Relation Age of Onset  . Hypertension Mother   . Hypertension Father   . Hypertension Brother     Social History   Tobacco Use  . Smoking status: Current Every Day Smoker    Packs/day: 0.50    Types: Cigarettes  . Smokeless tobacco: Never Used  Substance Use Topics  . Alcohol use: Yes    Alcohol/week: 0.0 standard drinks  . Drug use: No    Home Medications Prior to Admission medications   Medication Sig Start Date End Date Taking? Authorizing Provider  amLODipine (NORVASC) 10 MG tablet Take 1 tablet (10 mg total) by mouth daily. 04/26/20 07/25/20 Yes Mayers, Cari S, PA-C  gabapentin (NEURONTIN) 800 MG tablet Take 1 tablet (800 mg total) by mouth 2 (two) times daily. 04/26/20  Yes Mayers, Cari S, PA-C  lisinopril-hydrochlorothiazide  (ZESTORETIC) 20-12.5 MG tablet Take 1 tablet by mouth daily. 04/26/20  Yes Mayers, Cari S, PA-C  amLODipine (NORVASC) 10 MG tablet TAKE 1 TABLET BY MOUTH DAILY. Patient taking differently: Take 10 mg by mouth daily. 04/26/20 04/26/21  Mayers, Cari S, PA-C  Blood Pressure Monitoring (PREMIUM AUTOMATIC BP MONITOR) DEVI 1 Device by Does not apply route daily. Patient not taking: No sig reported 11/04/14   Weber, Sarah L, PA-C  gabapentin (NEURONTIN) 800 MG tablet TAKE 1 TABLET (800 MG TOTAL) BY MOUTH 2 (TWO) TIMES DAILY. Patient taking differently: Take 800 mg by mouth 2 (two) times daily. 04/26/20 04/26/21  Mayers, Cari S, PA-C  lisinopril-hydrochlorothiazide (ZESTORETIC) 20-12.5 MG tablet TAKE 1 TABLET BY MOUTH DAILY. 04/26/20 04/26/21  Mayers, Kasandra Knudsen, PA-C    Allergies    Patient has no known allergies.  Review of Systems   Review of Systems  Constitutional: Negative for appetite change.  Neurological: Positive for weakness and headaches.  Psychiatric/Behavioral: Positive for confusion.    Physical Exam Updated Vital Signs BP (!) 151/96   Pulse (!) 58   Temp 98.9 F (37.2 C) (Oral)   Resp 16   SpO2 100%   Physical Exam Vitals reviewed.  HENT:     Head: Atraumatic.  Eyes:     Extraocular Movements: Extraocular movements intact.     Pupils: Pupils are equal, round, and reactive to light.  Cardiovascular:     Rate and Rhythm: Regular rhythm.  Pulmonary:     Breath sounds: Normal breath sounds.  Abdominal:     Tenderness: There is no abdominal tenderness.  Musculoskeletal:     Cervical back: Neck supple.     Comments: Some tenderness in left hand.  Skin:    General: Skin is warm.     Capillary Refill: Capillary refill takes less than 2 seconds.  Neurological:     Mental Status: He is alert.     Comments: Patient awake and pleasant but some confusion.  Eye moves intact but patient does appear to prefer looking to the right.  Equal smile.  Visual fields grossly intact.  Left hand  is chronically contracted with some swelling.  Decreased movement at the hand and wrist.  Decreased strength mildly on left lower extremity compared to right.     ED Results / Procedures / Treatments   Labs (all labs ordered are listed, but only abnormal results are displayed) Labs Reviewed  COMPREHENSIVE METABOLIC PANEL - Abnormal; Notable for the following components:      Result Value   Potassium 3.3 (*)    Glucose, Bld 134 (*)    Creatinine, Ser 1.28 (*)    All other components within normal limits  CBG MONITORING, ED - Abnormal; Notable for the following components:   Glucose-Capillary 168 (*)    All other components within normal limits  ETHANOL  PROTIME-INR  APTT  CBC  DIFFERENTIAL  RAPID URINE DRUG SCREEN, HOSP PERFORMED  URINALYSIS, ROUTINE W REFLEX MICROSCOPIC    EKG EKG Interpretation  Date/Time:  Sunday Jul 09 2020 20:40:46 EDT Ventricular Rate:  79 PR Interval:  172 QRS Duration: 148 QT Interval:  414 QTC Calculation: 474 R Axis:   208 Text Interpretation: Normal sinus rhythm Right bundle branch block Abnormal ECG No old tracing to compare Confirmed by Benjiman Core [phone]) on 07/09/2020 9:24:07 PM   Radiology CT HEAD WO CONTRAST  Result Date: 07/09/2020 CLINICAL DATA:  Initial evaluation for acute mental status change, stroke suspected. Left-sided numbness. EXAM: CT HEAD WITHOUT CONTRAST TECHNIQUE: Contiguous axial images were obtained from the base of the skull through the vertex without intravenous contrast. COMPARISON:  Prior CT from 03/07/2020. FINDINGS: Brain: Cerebral volume within normal limits for age. Subtle hypodensity seen involving the mid and posterior right frontoparietal region, concerning for acute right MCA distribution infarct. Mild patchy involvement of the right insula. No intracranial hemorrhage. No other acute large vessel territory infarct. No mass lesion, midline shift or mass effect. No hydrocephalus or extra-axial fluid collection.  Vascular: No visible hyperdense vessel. Skull: Scalp soft tissues and calvarium within normal limits. Sinuses/Orbits: Globes and orbital soft tissues demonstrate no acute finding. Question mild proptosis. Few small retention cyst noted within the left maxillary sinus. Paranasal sinuses are otherwise clear. No mastoid effusion. Other: None. IMPRESSION: 1. Subtle hypodensity involving the mid and posterior right frontoparietal region, concerning for acute right MCA distribution infarct. No intracranial hemorrhage. 2. Question mild proptosis. Correlation with physical exam recommended. Results communicated by telephone at the time of interpretation on 07/09/2020 at 9:38 pm to provider [facility] , who verbally acknowledged these results. Electronically Signed   By: Rise Mu M.D.   On: 07/09/2020 21:39    Procedures Procedures   Medications Ordered in ED Medications  0.9 %  sodium chloride infusion ( Intravenous New Bag/Given 07/09/20 2322)  clopidogrel (PLAVIX) tablet 75 mg (has no administration in time range)  sodium  chloride 0.9 % bolus 1,000 mL (has no administration in time range)  aspirin tablet 325 mg (325 mg Oral Given 07/09/20 2323)  iohexol (OMNIPAQUE) 350 MG/ML injection 100 mL (50 mLs Intravenous Contrast Given 07/09/20 2240)  clopidogrel (PLAVIX) tablet 300 mg (300 mg Oral Given 07/09/20 2323)    ED Course  I have reviewed the triage vital signs and the nursing notes.  Pertinent labs & imaging results that were available during my care of the patient were reviewed by me and considered in my medical decision making (see chart for details).    MDM Rules/Calculators/A&P                          Patient presents with left sided weakness.  Worsening confusion.  Head CT showed potentially acute stroke.  It is somewhat difficult to get history but talking to family through neurology and states that couple days ago things may have worsened with more difficulty holding things.  Does have  some vascular occlusions.  Not a tPA candidate due to time of onset.  Neurology recommended IV fluids, laying flat, and aspirin and Plavix.  Recommended stroke work-up.  Including MRI of brain and cervical spine.  Will discuss with hospitalist. Final Clinical Impression(s) / ED Diagnoses Final diagnoses:  Cerebrovascular accident (CVA), unspecified mechanism [facility])    Rx / DC Orders ED Discharge Orders    None       Benjiman Core, MD 07/09/20 2334

## 2020-07-09 NOTE — ED Notes (Signed)
Patient transported to CT 

## 2020-07-09 NOTE — Consult Note (Incomplete)
NEUROLOGY CONSULTATION NOTE   Date of service: Jul 09, 2020 Patient Name: Robert [name] MRN:  [id number] DOB:  [birth year] Reason for consult: "R MCA stroke" Requesting Provider: Benjiman Core, MD _ _ _   _ __   _ __ _ _  __ __   _ __   __ _  History of Present Illness  Robert [name] is a 55 y.o. left handed male with PMH significant for HTn, HLD, smoker ~1ppd who presents with few months hx of intermittent left hand weakness, about 1 week history of unable to write with his left hand and left hand drops down for the last 2-3 days.  He has had left arm symptoms including pain and numbness in the left arm and chronic neck pain after an injury in the past and he put it all to that.  CTH here without contrast with acute R MCA stroke. CT angio with R ICA occlusion with distal reconstitution with colatteral flow in the R MCA through circle of willis. R MCA is attenuated but patent.and focal stenosis at vertebrobasilar junction vs thrombus vs fenestration.  Of note, looking at prior notes from ED visit at [facility] from 03/07/20, he was seen then for 3-4 months history of left arm symptoms. He reported hx of injury to his neck with chronic neck pain and vague tingling in his left hand and face. He had CT of his C spine with report reading "Significant cervical spondylosis as above, with symmetrical changes at C3-4 and C5-6, and left predominant changes at C6-7."  MRS: 0 LKW: unclear, maybe a couple days ago at the least. TPA/Thrombectomy: No, outside window. NIHSS components Score: Comment  1a Level of Conscious 0[x]  1[]  2[]  3[]      1b LOC Questions 0[x]  1[]  2[]       1c LOC Commands 0[x]  1[]  2[]       2 Best Gaze 0[x]  1[]  2[]       3 Visual 0[]  1[x]  2[]  3[]      4 Facial Palsy 0[]  1[x]  2[]  3[]      5a Motor Arm - left 0[]  1[]  2[x]  3[]  4[]  UN[]    5b Motor Arm - Right 0[x]  1[]  2[]  3[]  4[]  UN[]    6a Motor Leg - Left 0[x]  1[]  2[]  3[]  4[]  UN[]    6b Motor Leg - Right 0[x]  1[]  2[]  3[]  4[]  UN[]    7  Limb Ataxia 0[x]  1[]  2[]  3[]  UN[]     8 Sensory 0[]  1[x]  2[]  UN[]      9 Best Language 0[x]  1[]  2[]  3[]      10 Dysarthria 0[x]  1[]  2[]  UN[]      11 Extinct. and Inattention 0[x]  1[]  2[]       TOTAL: 5     ROS   Constitutional Denies weight loss, fever and chills.   HEENT Denies changes in vision and hearing.   Respiratory Denies SOB and cough.   CV Denies palpitations and CP   GI Denies abdominal pain, nausea, vomiting and diarrhea.   GU Denies dysuria and urinary frequency.   MSK Denies myalgia and joint pain.   Skin Denies rash and pruritus.   Neurological Endorses mild headache but no syncope.   Psychiatric Denies recent changes in mood. Denies anxiety and depression.    Past History   Past Medical History:  Diagnosis Date  . Hypertension    Past Surgical History:  Procedure Laterality Date  . TONSILLECTOMY AND ADENOIDECTOMY     Family History  Problem Relation Age of Onset  . Hypertension Mother   .  Hypertension Father   . Hypertension Brother    Social History   Socioeconomic History  . Marital status: Single    Spouse name: Not on file  . Number of children: 2  . Years of education: HS diploma and some college  . Highest education level: Not on file  Occupational History  . Occupation: truck Hospital doctor    Comment: [employer]  . Smoking status: Current Every Day Smoker    Packs/day: 0.50    Types: Cigarettes  . Smokeless tobacco: Never Used  Substance and Sexual Activity  . Alcohol use: Yes    Alcohol/week: 0.0 standard drinks  . Drug use: No  . Sexual activity: Not Currently  Other Topics Concern  . 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  Physical Exam   General: Laying comfortably in bed; in no acute distress. HENT: Normal oropharynx and mucosa. Normal external appearance of ears and nose. Neck: Supple, no pain or tenderness CV: No JVD. No peripheral edema. Pulmonary: Symmetric Chest rise. Normal respiratory effort. Abdomen: Soft to touch, non-tender. Ext: No cyanosis, edema, or deformity Skin: No rash. Normal palpation of skin.  Musculoskeletal: Normal digits and nails by inspection. No clubbing.   Neurologic Examination  Mental status/Cognition: Alert, oriented to self, place, month and year, good attention. Speech/language: Fluent, comprehension intact, object naming intact, repetition intact. Cranial nerves:   CN II Pupils equal and reactive to light, no VF deficits   CN III,IV,VI EOM intact, no gaze preference or deviation, no nystagmus   CN V normal sensation in V1, V2, and V3 segments bilaterally   CN VII mild asymmetry with smiling, no nasolabial fold flattening   CN VIII normal hearing to speech   CN IX & X normal palatal elevation, no uvular deviation   CN XI 5/5 head turn and 5/5 shoulder shrug bilaterally   CN XII midline tongue protrusion   Motor:  Muscle bulk: normal, tone normal. Mvmt Root Nerve  Muscle Right Left Comments  SA C5/6 Ax Deltoid 5 1   EF C5/6 Mc Biceps 5 4   EE C6/7/8 Rad Triceps 5 4   WF C6/7 Med FCR     WE C7/8 PIN ECU     F Ab C8/T1 U ADM/FDI 5 2   HF L1/2/3 Fem Illopsoas 5 5   KE L2/3/4 Fem Quad 5 5   DF L4/5 D Peron Tib Ant 5 5   PF S1/2 Tibial Grc/Sol 5 5    Reflexes:  Right Left Comments  Pectoralis      Biceps  (C5/6) 2 2   Brachioradialis (C5/6) 2 2    Triceps (C6/7) 2 2  Patellar (L3/4) 2 2    Achilles (S1)      Hoffman      Plantar     Jaw jerk    Sensation:  Light touch Decreased in LUE to touch   Pin prick    Temperature    Vibration   Proprioception    Coordination/Complex Motor:  - Finger to Nose intact in RUE, too weak in LUE to test. - Heel to shin intact BL - Rapid alternating movement intact in RUE, unable to do with LUE - Gait: Deferred.  Labs   CBC:  Recent Labs  Lab 07/09/20 2023  WBC 7.8  NEUTROABS 5.6  HGB 13.6  HCT 40.4  MCV 94.4  PLT 231    Basic Metabolic Panel:  Lab Results  Component Value Date   NA 138 07/09/2020   K 3.3 (L) 07/09/2020   CO2 28 07/09/2020   GLUCOSE 134 (H) 07/09/2020   BUN 17 07/09/2020   CREATININE 1.28 (H) 07/09/2020   CALCIUM 8.9 07/09/2020   GFRNONAA >60 07/09/2020   GFRAA >89 10/31/2015   Lipid Panel: No results found for: LDLCALC HgbA1c: No results found for: HGBA1C Urine Drug Screen: No results found for: LABOPIA, COCAINSCRNUR, LABBENZ, AMPHETMU, THCU, LABBARB  Alcohol Level     Component Value Date/Time   ETH <10 07/09/2020 2023    CT Head without contrast: 1. Subtle hypodensity involving the mid and posterior right frontoparietal region, concerning for acute right MCA distribution infarct. No intracranial hemorrhage. 2. Question mild proptosis. Correlation with physical exam recommended.  CT angio Head and Neck with contrast: R ICA occlusion at the bifurcation with distal reconsitution. Collateral flow across circle of willis. Focal hypodensity at vertebrobasilar junction, likely a short segment fenestration vs stenosis.  MRI Brain without contrast: Pending.  MR Angio head without contrast and Carotid Duplex BL: ***  MRI Brain: ***  rEEG:  ***  Impression   Robert [name] is a 55 y.o. male with PMH significant for ***. His neurologic examination is notable for ***.  Recommendations   *** ______________________________________________________________________   Thank you for the opportunity to take part in the care of this patient. If you have any further questions, please contact the neurology consultation attending.  Signed,  Erick Blinks [facility] Pager Number [phone] _ _ _   _ __   _ __ _ _  __ __   _ __   __ _

## 2020-07-09 NOTE — ED Triage Notes (Signed)
Pt reports left arm pain and numbness that has been going on for "months." "my family says I'm disoriented" but he is unable to articulates why.  No other neuro symptoms noted at this time.  Morrell Riddle, cousin describes moments of forgetfulness and mom reports he got to another city and did not know were he was.  Reports a family hx.

## 2020-07-09 NOTE — ED Notes (Signed)
Pt transported to radiology.

## 2020-07-09 NOTE — Consult Note (Addendum)
NEUROLOGY CONSULTATION NOTE   Date of service: Jul 09, 2020 Patient Name: Robert [name] MRN:  [id number] DOB:  1965/12/29 Reason for consult: "R MCA stroke" Requesting Provider: Benjiman Core, MD _ _ _   _ __   _ __ _ _  __ __   _ __   __ _  History of Present Illness  [name] is a 55 y.o. left handed male with PMH significant for HTn, HLD, smoker ~1ppd who presents with few months hx of intermittent left hand weakness, about 1 week history of unable to write with his left hand and left hand drops down for the last 2-3 days.  He has had left arm symptoms including pain and numbness in the left arm and chronic neck pain after an injury in the past and he put it all to that.  CTH here without contrast with acute R MCA stroke. CT angio with R ICA occlusion with distal reconstitution with colatteral flow in the R MCA through circle of willis. R MCA is attenuated but patent. Fenestration at the vertebrobasilar junction vs focal stenosis.   Of note, looking at prior notes from ED visit at [facility] from 03/07/20, he was seen then for 3-4 months history of left arm symptoms. He reported hx of injury to his neck with chronic neck pain and vague tingling in his left hand and face. He had CT of his C spine with report reading "Significant cervical spondylosis as above, with symmetrical changes at C3-4 and C5-6, and left predominant changes at C6-7."  MRS: 0 LKW: unclear, maybe a couple days ago at the least. TPA/Thrombectomy: No, outside window. NIHSS components Score: Comment  1a Level of Conscious 0[x]  1[]  2[]  3[]      1b LOC Questions 0[x]  1[]  2[]       1c LOC Commands 0[x]  1[]  2[]       2 Best Gaze 0[x]  1[]  2[]       3 Visual 0[]  1[x]  2[]  3[]      4 Facial Palsy 0[]  1[x]  2[]  3[]      5a Motor Arm - left 0[]  1[]  2[x]  3[]  4[]  UN[]    5b Motor Arm - Right 0[x]  1[]  2[]  3[]  4[]  UN[]    6a Motor Leg - Left 0[x]  1[]  2[]  3[]  4[]  UN[]    6b Motor Leg - Right 0[x]  1[]  2[]  3[]  4[]  UN[]    7 Limb  Ataxia 0[x]  1[]  2[]  3[]  UN[]     8 Sensory 0[]  1[x]  2[]  UN[]      9 Best Language 0[x]  1[]  2[]  3[]      10 Dysarthria 0[x]  1[]  2[]  UN[]      11 Extinct. and Inattention 0[x]  1[]  2[]       TOTAL: 5     ROS   Constitutional Denies weight loss, fever and chills.   HEENT Denies changes in vision and hearing.   Respiratory Denies SOB and cough.   CV Denies palpitations and CP   GI Denies abdominal pain, nausea, vomiting and diarrhea.   GU Denies dysuria and urinary frequency.   MSK Denies myalgia and joint pain.   Skin Denies rash and pruritus.   Neurological Endorses mild headache but no syncope.   Psychiatric Denies recent changes in mood. Denies anxiety and depression.    Past History   Past Medical History:  Diagnosis Date  . Hypertension    Past Surgical History:  Procedure Laterality Date  . TONSILLECTOMY AND ADENOIDECTOMY     Family History  Problem Relation Age of Onset  . Hypertension Mother   .  Hypertension Father   . Hypertension Brother    Social History   Socioeconomic History  . Marital status: Single    Spouse name: Not on file  . Number of children: 2  . Years of education: HS diploma and some college  . Highest education level: Not on file  Occupational History  . Occupation: truck Hospital doctor    Comment: [employer]u transportation [employer]  . Smoking status: Current Every Day Smoker    Packs/day: 0.50    Types: Cigarettes  . Smokeless tobacco: Never Used  Substance and Sexual Activity  . Alcohol use: Yes    Alcohol/week: 0.0 standard drinks  . Drug use: No  . Sexual activity: Not Currently  Other Topics Concern  . 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  Physical Exam   General: Laying comfortably in bed; in no acute distress. HENT: Normal oropharynx and mucosa. Normal external appearance of ears and nose. Neck: Supple, no pain or tenderness CV: No JVD. No peripheral edema. Pulmonary: Symmetric Chest rise. Normal respiratory effort. Abdomen: Soft to touch, non-tender. Ext: No cyanosis, edema, or deformity Skin: No rash. Normal palpation of skin.  Musculoskeletal: Normal digits and nails by inspection. No clubbing.   Neurologic Examination  Mental status/Cognition: Alert, oriented to self, place, month and year, good attention. Speech/language: Fluent, comprehension intact, object naming intact, repetition intact. Cranial nerves:   CN II Pupils equal and reactive to light, no VF deficits   CN III,IV,VI EOM intact, no gaze preference or deviation, no nystagmus   CN V normal sensation in V1, V2, and V3 segments bilaterally   CN VII mild asymmetry with smiling, no nasolabial fold flattening   CN VIII normal hearing to speech   CN IX & X normal palatal elevation, no uvular deviation   CN XI 5/5 head turn and 5/5 shoulder shrug bilaterally   CN XII midline tongue protrusion   Motor:  Muscle bulk: normal, tone normal. Mvmt Root Nerve  Muscle Right Left Comments  SA C5/6 Ax Deltoid 5 1   EF C5/6 Mc Biceps 5 4   EE C6/7/8 Rad Triceps 5 4   WF C6/7 Med FCR     WE C7/8 PIN ECU     F Ab C8/T1 U ADM/FDI 5 2   HF L1/2/3 Fem Illopsoas 5 5   KE L2/3/4 Fem Quad 5 5   DF L4/5 D Peron Tib Ant 5 5   PF S1/2 Tibial Grc/Sol 5 5    Reflexes:  Right Left Comments  Pectoralis      Biceps (C5/6) 2 2    Brachioradialis (C5/6) 2 2    Triceps (C6/7) 2 2  Patellar (L3/4) 2 2    Achilles (S1)      Hoffman      Plantar     Jaw jerk    Sensation:  Light touch Decreased in LUE to touch   Pin prick    Temperature    Vibration   Proprioception    Coordination/Complex Motor:  - Finger to Nose intact in RUE, too weak in LUE to test. - Heel to shin intact BL - Rapid alternating movement intact in RUE, unable to do with LUE - Gait: Deferred.  Labs   CBC:  Recent Labs  Lab 07/09/20 2023  WBC 7.8  NEUTROABS 5.6  HGB 13.6  HCT 40.4  MCV 94.4  PLT 231    Basic Metabolic Panel:  Lab Results  Component Value Date   NA 138 07/09/2020   K 3.3 (L) 07/09/2020   CO2 28 07/09/2020   GLUCOSE 134 (H) 07/09/2020   BUN 17 07/09/2020   CREATININE 1.28 (H) 07/09/2020   CALCIUM 8.9 07/09/2020   GFRNONAA >60 07/09/2020   GFRAA >89 10/31/2015   Lipid Panel: No results found for: LDLCALC HgbA1c: No results found for: HGBA1C Urine Drug Screen: No results found for: LABOPIA, COCAINSCRNUR, LABBENZ, AMPHETMU, THCU, LABBARB  Alcohol Level     Component Value Date/Time   ETH <10 07/09/2020 2023    CT Head without contrast: 1. Subtle hypodensity involving the mid and posterior right frontoparietal region, concerning for acute right MCA distribution infarct. No intracranial hemorrhage. 2. Question mild proptosis. Correlation with physical exam recommended.  CT angio Head and Neck with contrast: R ICA occlusion at the bifurcation with distal reconsitution. Collateral flow across circle of willis. Focal hypodensity at vertebrobasilar junction, likely a short segment fenestration vs stenosis.  MRI Brain without contrast: Pending.  Impression   Kylee Nardozzi is a 55 y.o. left handed male with PMH significant for HTn, HLD, smoker ~1ppd who presents with few months hx of intermittent left hand weakness, about 1 week history of unable to write with his left hand and left hand drops down  for the last 2-3 days. NIHSS of 5.  CTH here without contrast with acute R MCA stroke. CT angio with R ICA occlusion with distal reconstitution with colatteral flow in the R MCA through circle of willis. R MCA is attenuated but patent. Fenestration at the vertebrobasilar junction vs focal stenosis.   His symptoms are explained by the R MCA stroke and he clinically does not appear to have symptoms concerning for basilar stenosis.  Recommendations  Plan:  - Medicine admission with stroke workup. - Frequent Neuro checks per stroke unit protocol - MRI Brain without contrast pending. - TTE with bubble study. - Lipid panel with LDL is pending. - Please start statin if LDL > 70 - HbA1c is pending. - Antithrombotic - Aspirin 81mg  daily along with plavix 300mg  PO once and plavix 75mg  daily for 21 days, followed by aspirin 81mg  daily alone. - Recommend DVT ppx - SBP goal - permissive hypertension first 24 h < 220/110. Held home meds.  - Recommend Telemetry monitoring for arrythmia - Recommend bedside swallow screen prior to PO intake. - Stroke education booklet - Recommend PT/OT/SLP consult - Recommend Urine Tox screen. - Counseled on the importance of quitting smoking. - Head of bed flat - Fluid bolus and IV fluids at 110ml/hr started. - MRI C spine without contrast given hx of neck injury and pain.  ______________________________________________________________________   Thank you for the opportunity to take  part in the care of this patient. If you have any further questions, please contact the neurology consultation attending.  Signed,  Tina Pager Number [phone] _ _ _   _ __   _ __ _ _  __ __   _ __   __ _

## 2020-07-10 ENCOUNTER — Encounter (HOSPITAL_COMMUNITY): Payer: Self-pay | Admitting: Internal Medicine

## 2020-07-10 ENCOUNTER — Observation Stay (HOSPITAL_BASED_OUTPATIENT_CLINIC_OR_DEPARTMENT_OTHER): Payer: Medicaid Other

## 2020-07-10 ENCOUNTER — Other Ambulatory Visit: Payer: Self-pay

## 2020-07-10 ENCOUNTER — Observation Stay (HOSPITAL_COMMUNITY): Payer: Medicaid Other

## 2020-07-10 DIAGNOSIS — I6389 Other cerebral infarction: Secondary | ICD-10-CM

## 2020-07-10 DIAGNOSIS — M4802 Spinal stenosis, cervical region: Secondary | ICD-10-CM | POA: Diagnosis present

## 2020-07-10 DIAGNOSIS — F17210 Nicotine dependence, cigarettes, uncomplicated: Secondary | ICD-10-CM | POA: Diagnosis present

## 2020-07-10 DIAGNOSIS — Z720 Tobacco use: Secondary | ICD-10-CM | POA: Diagnosis not present

## 2020-07-10 DIAGNOSIS — R29810 Facial weakness: Secondary | ICD-10-CM | POA: Diagnosis present

## 2020-07-10 DIAGNOSIS — Z8249 Family history of ischemic heart disease and other diseases of the circulatory system: Secondary | ICD-10-CM | POA: Diagnosis not present

## 2020-07-10 DIAGNOSIS — G959 Disease of spinal cord, unspecified: Secondary | ICD-10-CM | POA: Diagnosis not present

## 2020-07-10 DIAGNOSIS — I10 Essential (primary) hypertension: Secondary | ICD-10-CM | POA: Diagnosis not present

## 2020-07-10 DIAGNOSIS — R29705 NIHSS score 5: Secondary | ICD-10-CM | POA: Diagnosis present

## 2020-07-10 DIAGNOSIS — I6521 Occlusion and stenosis of right carotid artery: Secondary | ICD-10-CM

## 2020-07-10 DIAGNOSIS — I63411 Cerebral infarction due to embolism of right middle cerebral artery: Secondary | ICD-10-CM | POA: Diagnosis present

## 2020-07-10 DIAGNOSIS — I615 Nontraumatic intracerebral hemorrhage, intraventricular: Secondary | ICD-10-CM

## 2020-07-10 DIAGNOSIS — I6522 Occlusion and stenosis of left carotid artery: Secondary | ICD-10-CM | POA: Diagnosis not present

## 2020-07-10 DIAGNOSIS — Z20822 Contact with and (suspected) exposure to covid-19: Secondary | ICD-10-CM | POA: Diagnosis present

## 2020-07-10 DIAGNOSIS — H547 Unspecified visual loss: Secondary | ICD-10-CM | POA: Diagnosis present

## 2020-07-10 DIAGNOSIS — I69354 Hemiplegia and hemiparesis following cerebral infarction affecting left non-dominant side: Secondary | ICD-10-CM | POA: Diagnosis not present

## 2020-07-10 DIAGNOSIS — I639 Cerebral infarction, unspecified: Secondary | ICD-10-CM | POA: Diagnosis not present

## 2020-07-10 DIAGNOSIS — Z79899 Other long term (current) drug therapy: Secondary | ICD-10-CM | POA: Diagnosis not present

## 2020-07-10 DIAGNOSIS — M4722 Other spondylosis with radiculopathy, cervical region: Secondary | ICD-10-CM | POA: Diagnosis present

## 2020-07-10 DIAGNOSIS — E785 Hyperlipidemia, unspecified: Secondary | ICD-10-CM | POA: Diagnosis present

## 2020-07-10 DIAGNOSIS — I454 Nonspecific intraventricular block: Secondary | ICD-10-CM | POA: Diagnosis present

## 2020-07-10 LAB — CBC
HCT: 37.6 % — ABNORMAL LOW (ref 39.0–52.0)
Hemoglobin: 12.5 g/dL — ABNORMAL LOW (ref 13.0–17.0)
MCH: 31.4 pg (ref 26.0–34.0)
MCHC: 33.2 g/dL (ref 30.0–36.0)
MCV: 94.5 fL (ref 80.0–100.0)
Platelets: 214 10*3/uL (ref 150–400)
RBC: 3.98 MIL/uL — ABNORMAL LOW (ref 4.22–5.81)
RDW: 11.6 % (ref 11.5–15.5)
WBC: 7 10*3/uL (ref 4.0–10.5)
nRBC: 0 % (ref 0.0–0.2)

## 2020-07-10 LAB — BASIC METABOLIC PANEL
Anion gap: 9 (ref 5–15)
BUN: 16 mg/dL (ref 6–20)
CO2: 24 mmol/L (ref 22–32)
Calcium: 8.6 mg/dL — ABNORMAL LOW (ref 8.9–10.3)
Chloride: 105 mmol/L (ref 98–111)
Creatinine, Ser: 1.17 mg/dL (ref 0.61–1.24)
GFR, Estimated: >60 mL/min (ref >60)
Glucose, Bld: 81 mg/dL (ref 70–99)
Potassium: 3.9 mmol/L (ref 3.5–5.1)
Sodium: 138 mmol/L (ref 135–145)

## 2020-07-10 LAB — URINALYSIS, ROUTINE W REFLEX MICROSCOPIC
Bacteria, UA: NONE SEEN
Bilirubin Urine: NEGATIVE
Glucose, UA: NEGATIVE mg/dL
Ketones, ur: NEGATIVE mg/dL
Leukocytes,Ua: NEGATIVE
Nitrite: NEGATIVE
Protein, ur: NEGATIVE mg/dL
Specific Gravity, Urine: 1.005 (ref 1.005–1.030)
pH: 8 (ref 5.0–8.0)

## 2020-07-10 LAB — RAPID URINE DRUG SCREEN, HOSP PERFORMED
Amphetamines: NOT DETECTED
Barbiturates: NOT DETECTED
Benzodiazepines: NOT DETECTED
Cocaine: NOT DETECTED
Opiates: NOT DETECTED
Tetrahydrocannabinol: NOT DETECTED

## 2020-07-10 LAB — HIV ANTIBODY (ROUTINE TESTING W REFLEX): HIV Screen 4th Generation wRfx: NONREACTIVE

## 2020-07-10 LAB — ECHOCARDIOGRAM COMPLETE BUBBLE STUDY
AR max vel: 2.9 cm2
AV Area VTI: 2.46 cm2
AV Area mean vel: 2.59 cm2
AV Mean grad: 6 mmHg
AV Peak grad: 11.3 mmHg
Ao pk vel: 1.68 m/s
Area-P 1/2: 3.21 cm2
S' Lateral: 3 cm

## 2020-07-10 LAB — LIPID PANEL
Cholesterol: 185 mg/dL (ref 0–200)
HDL: 43 mg/dL (ref >40)
LDL Cholesterol: 118 mg/dL — ABNORMAL HIGH (ref 0–99)
Total CHOL/HDL Ratio: 4.3 RATIO
Triglycerides: 120 mg/dL (ref <150)
VLDL: 24 mg/dL (ref 0–40)

## 2020-07-10 LAB — RESP PANEL BY RT-PCR (FLU A&B, COVID) ARPGX2
Influenza A by PCR: NEGATIVE
Influenza B by PCR: NEGATIVE
SARS Coronavirus 2 by RT PCR: NEGATIVE

## 2020-07-10 LAB — HEMOGLOBIN A1C
Hgb A1c MFr Bld: 4.5 % — ABNORMAL LOW (ref 4.8–5.6)
Mean Plasma Glucose: 82.45 mg/dL

## 2020-07-10 LAB — VITAMIN B12: Vitamin B-12: 217 pg/mL (ref 180–914)

## 2020-07-10 LAB — RPR: RPR Ser Ql: NONREACTIVE

## 2020-07-10 LAB — TSH: TSH: 0.552 u[IU]/mL (ref 0.350–4.500)

## 2020-07-10 IMAGING — CT CT HEAD W/O CM
4 series · 16 of 47 positions shown, 18 images · non-contrast
Comparison: MRI earlier same day.  CT studies yesterday.

CLINICAL DATA: Arm pain. Left-sided numbness. Right MCA territory
infarction.

EXAM:
CT HEAD WITHOUT CONTRAST
TECHNIQUE: Contiguous axial images were obtained from the base of the skull
through the vertex without intravenous contrast.

[Series 3: head without · axial · non-contrast · 0.43mm/px · z∈[-243,-108]mm · 7 of 37 slices shown, 9 images]
[im 5/37  brain]
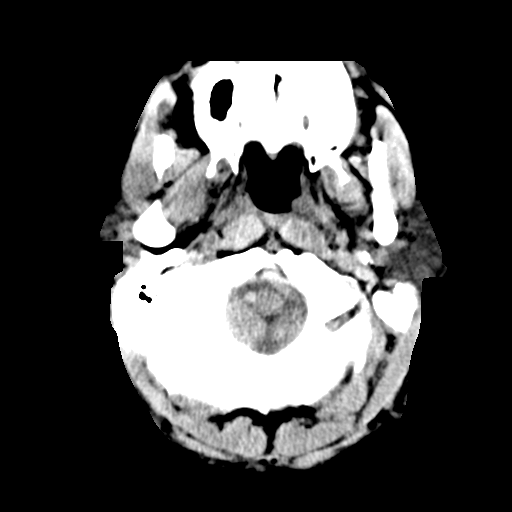
[im 5/37  bone]
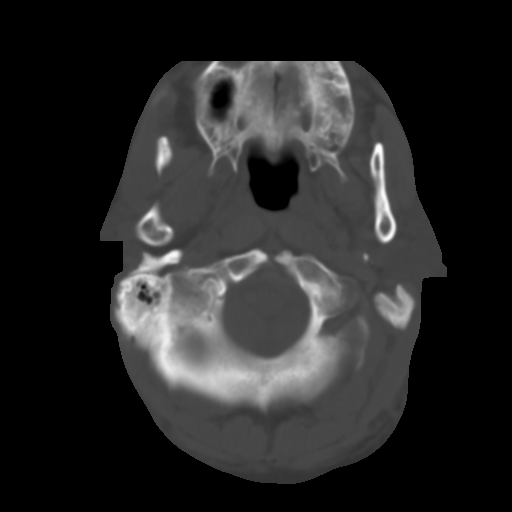
[im 10/37  brain]
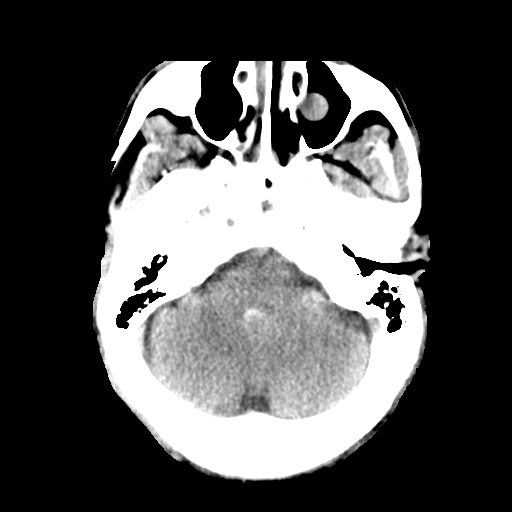
[im 14/37  brain]
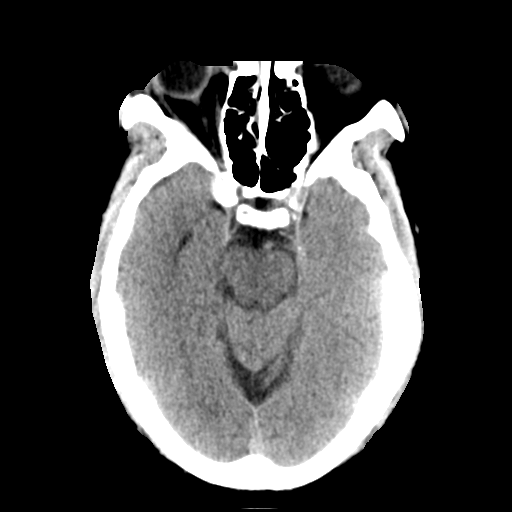
[im 19/37  brain]
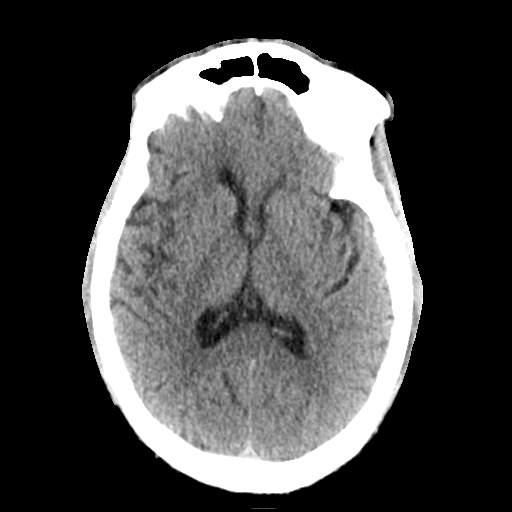
[im 23/37  brain]
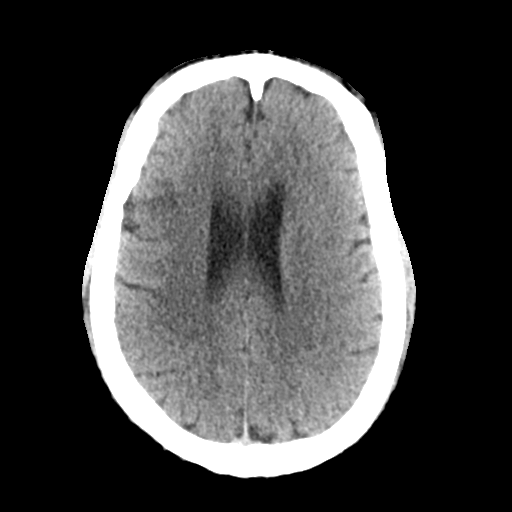
[im 23/37  bone]
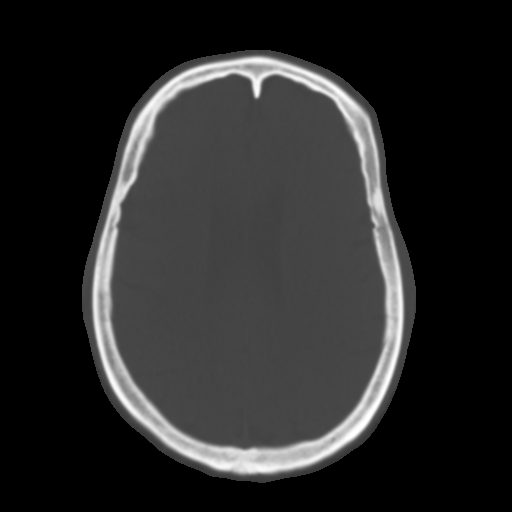
[im 28/37  brain]
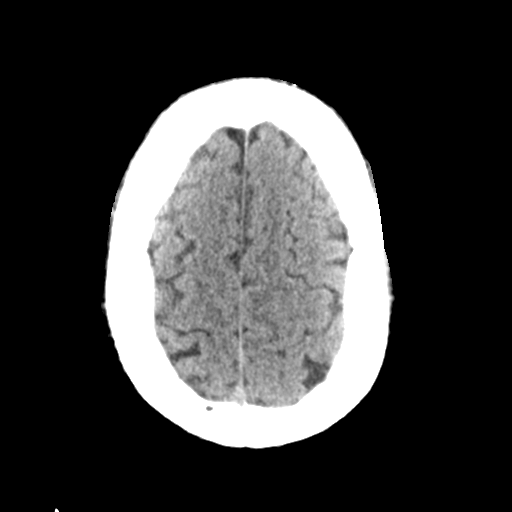
[im 32/37  brain]
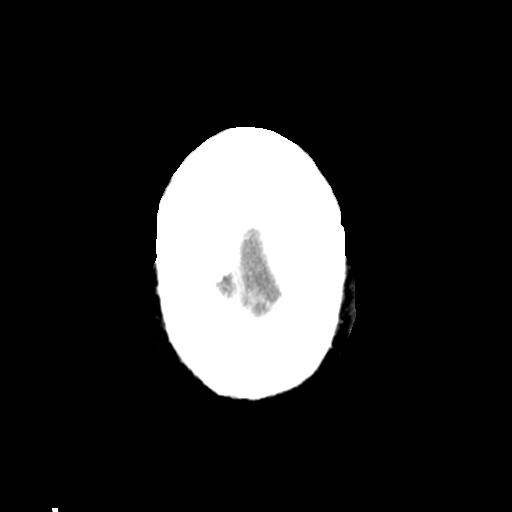

[Series 4: head bone · axial · 0.43mm/px · z∈[-245,-209]mm · 3 of 92 slices shown]
[im 10/92  bone]
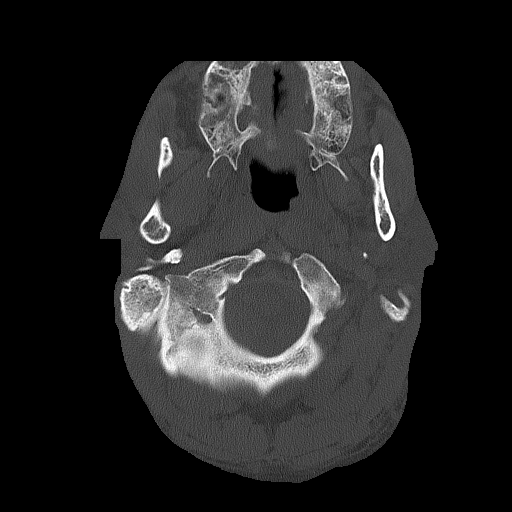
[im 19/92  bone]
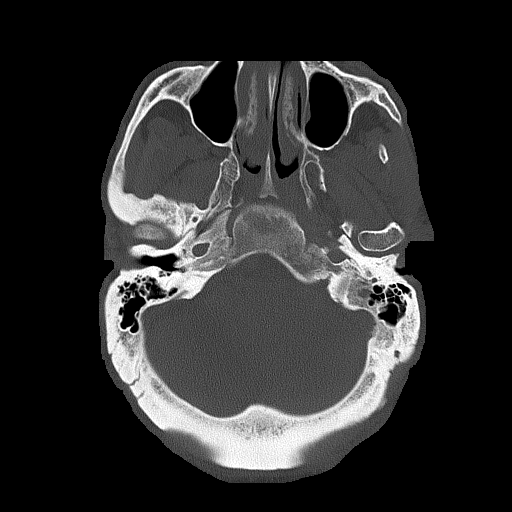
[im 28/92  bone]
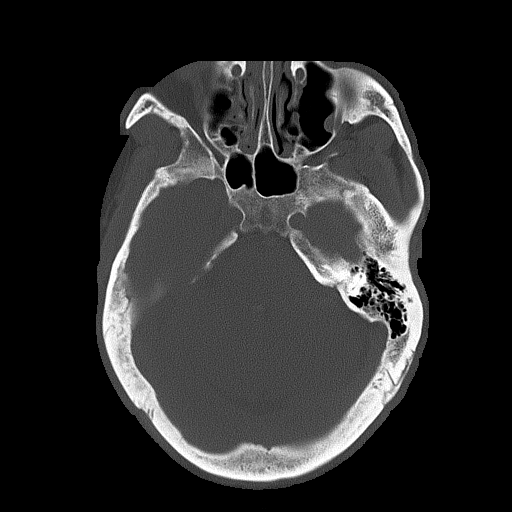

[Series 5: head without cor · coronal · non-contrast · 0.36mm/px · 3 of 73 slices shown]
[im 25/73  brain]
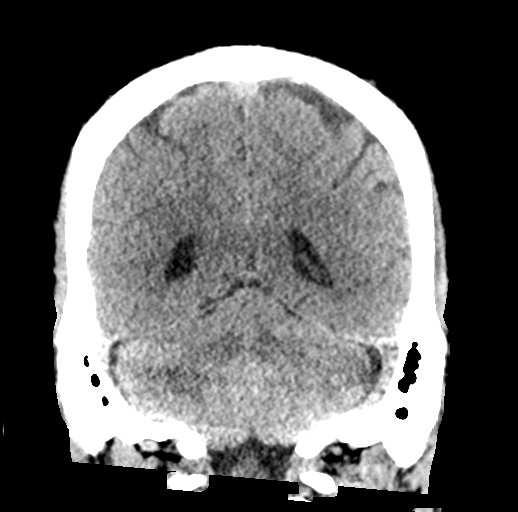
[im 33/73  brain]
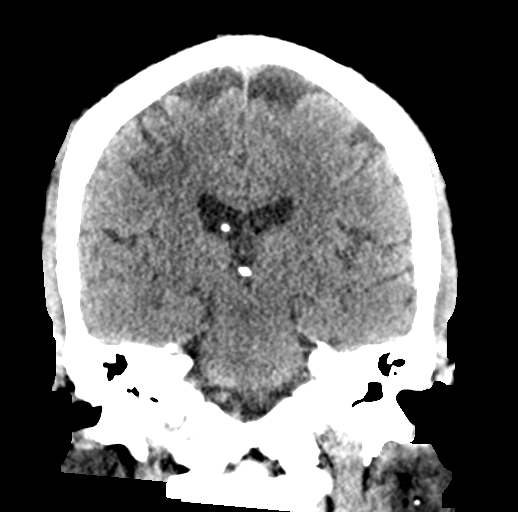
[im 41/73  brain]
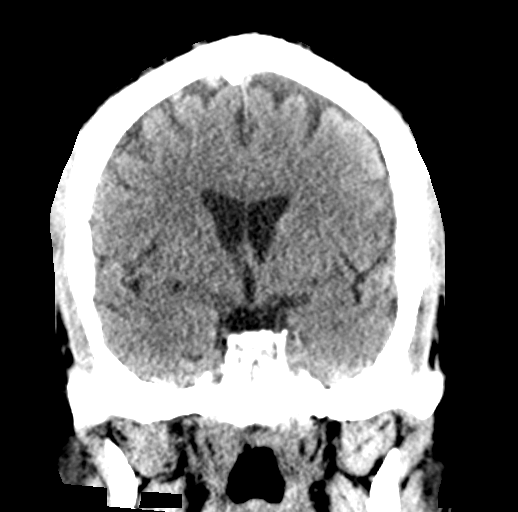

[Series 6: head without sag · sagittal · non-contrast · 0.36mm/px · 3 of 67 slices shown]
[im 23/67  brain]
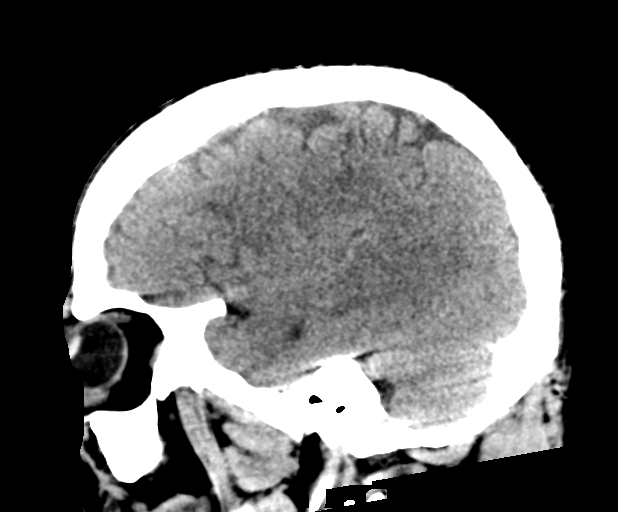
[im 34/67  brain]
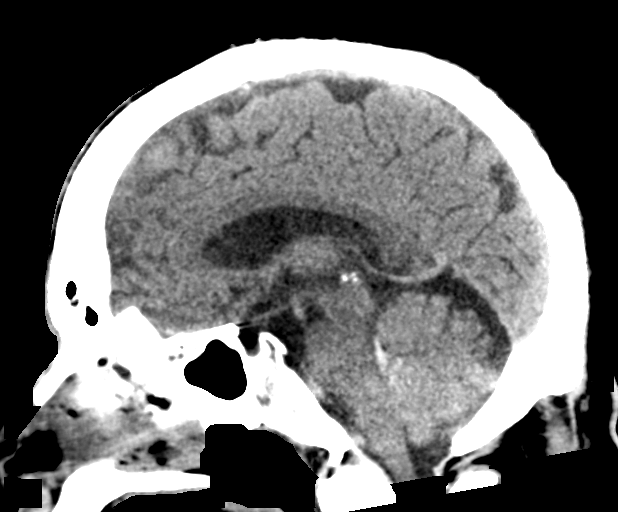
[im 45/67  brain]
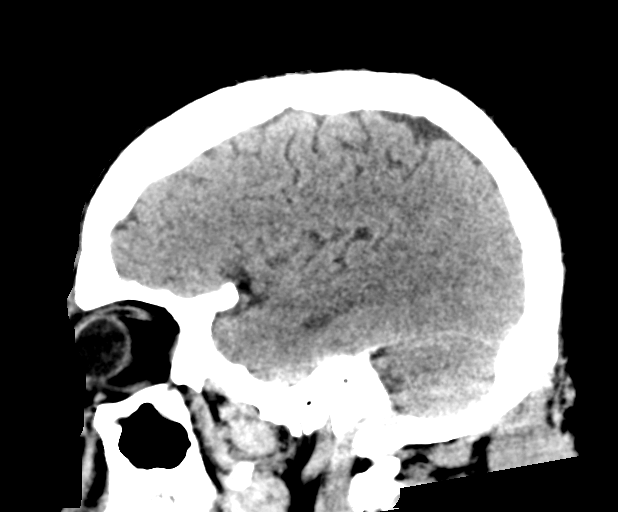

[16 of 47 positions shown; findings below may reference images not displayed]

FINDINGS: Brain: Blood is visible within the fourth ventricle, as seen on
previous MRI but not present on the head CT of yesterday. The
etiology of this hemorrhage is not clear. Small vessel acute
infarction in the right cerebellum appears the same. Scattered foci
cortical low-density in the right middle cerebral artery territory,
most notable in the right frontal operculum, consistent with the
previously seen right MCA territory infarctions. No evidence of
progression. No evidence of mass effect or hemorrhage within the
right MCA territory infarctions. No hydrocephalus or extra-axial
collection.

Vascular: No abnormal vascular finding.

Skull: Normal

Sinuses/Orbits: No sinus inflammatory disease.  Orbits negative.

Other: None
IMPRESSION: Areas of acute infarction in the right MCA territory, most notable
in the frontal operculum, appear stable the yesterday's CT. Small
right cerebellar acute infarction. Hemorrhage in the fourth
ventricle appears similar to the MRI of earlier today, but was not
present on the CT of yesterday. The etiology of this hemorrhage is
unclear.

## 2020-07-10 IMAGING — MR MR CERVICAL SPINE W/O CM
5 series · 34 of 48 positions shown · non-contrast
Comparison: [REDACTED] [HOSPITAL] [HOSPITAL]
cervical spine CT [DATE].

Brain MRI today reported separately.  CTA yesterday.

CLINICAL DATA: 54-year-old male with left side numbness. Right ICA
occlusion with attenuated right MCA and evidence of acute to
subacute right MCA territory infarct on CT/CTA yesterday. No
tPA/thrombectomy.

Neck pain.
EXAM:
MRI CERVICAL SPINE WITHOUT CONTRAST
TECHNIQUE: Multiplanar, multisequence MR imaging of the cervical spine was
performed. No intravenous contrast was administered.

[Series 11: T2 · sagittal · 3.0mm · 0.69mm/px · 6 of 15 slices shown (1 of 2)]
[im 1/15]
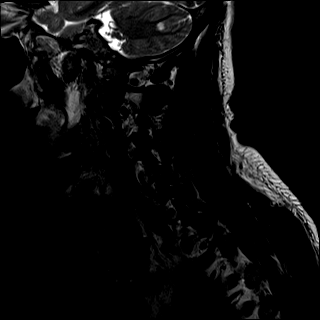
[im 3/15]
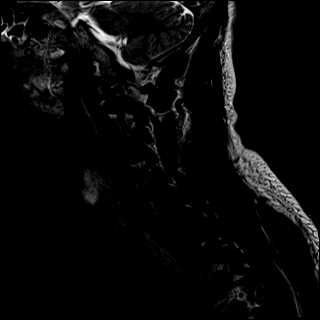
[im 6/15]
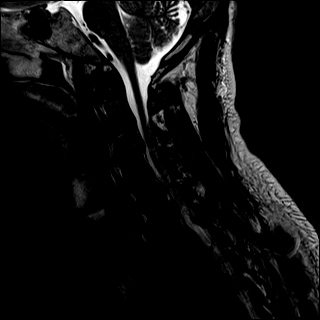
[im 9/15]
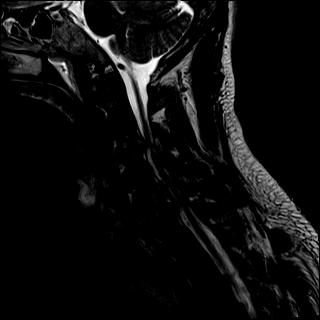
[im 12/15]
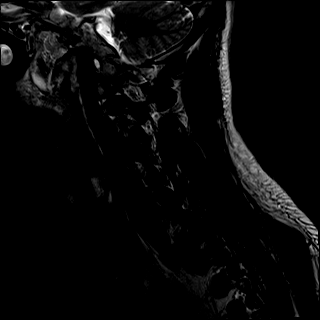
[im 15/15]
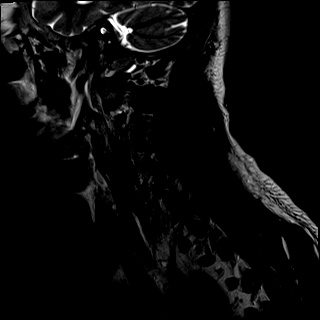

[Series 12: T1 · sagittal · 3.0mm · 0.69mm/px · 6 of 15 slices shown]
[im 1/15]
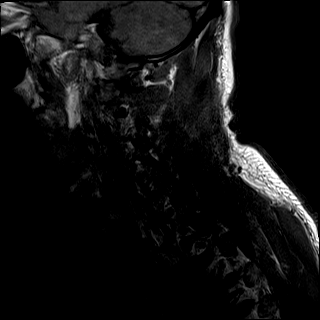
[im 3/15]
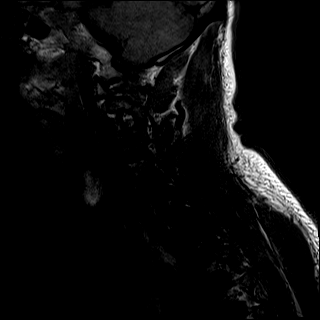
[im 6/15]
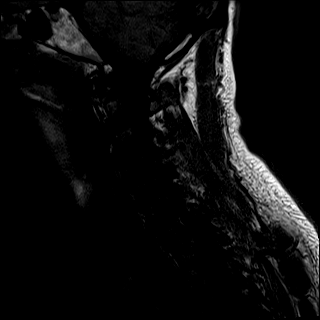
[im 9/15]
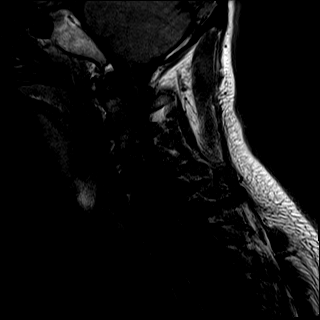
[im 12/15]
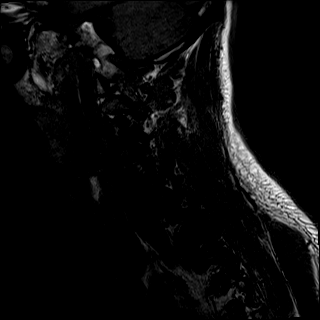
[im 15/15]
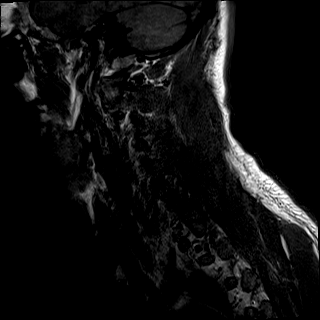

[Series 13: STIR · sagittal · 3.0mm · 0.86mm/px · 6 of 15 slices shown]
[im 1/15]
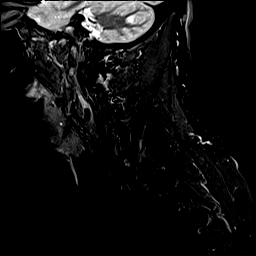
[im 3/15]
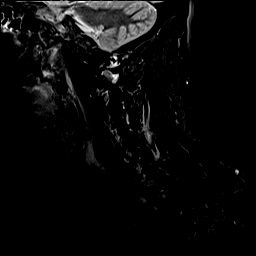
[im 6/15]
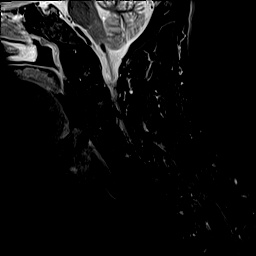
[im 9/15]
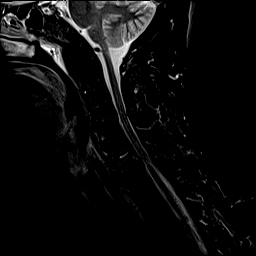
[im 12/15]
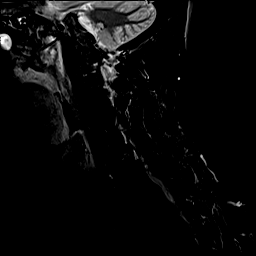
[im 15/15]
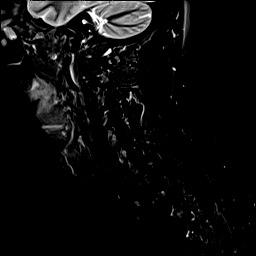

[Series 14: T2 · axial · 3.0mm · 0.66mm/px · z∈[-278,-164]mm · 8 of 35 slices shown (2 of 2)]
[im 1/35]
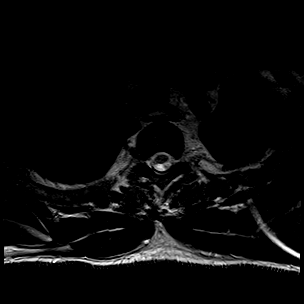
[im 6/35]
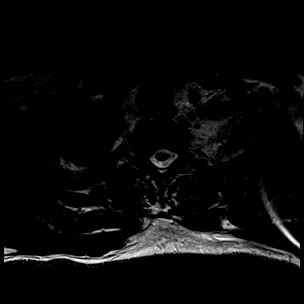
[im 11/35]
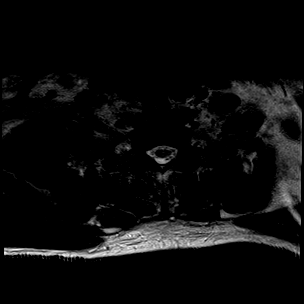
[im 16/35]
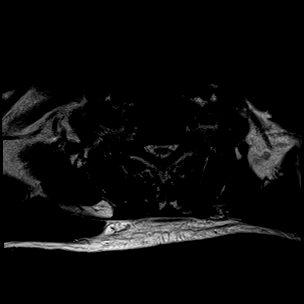
[im 19/35]
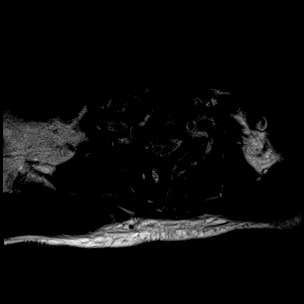
[im 24/35]
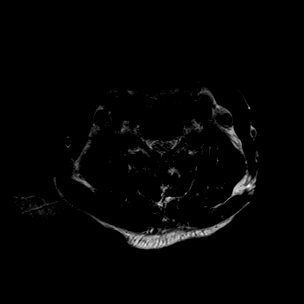
[im 29/35]
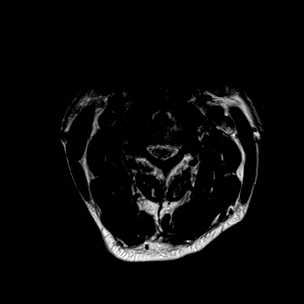
[im 35/35]
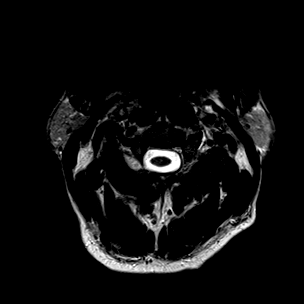

[Series 15: GRE · axial · 3.0mm · 0.39mm/px · z∈[-272,-164]mm · 8 of 40 slices shown]
[im 3/40]
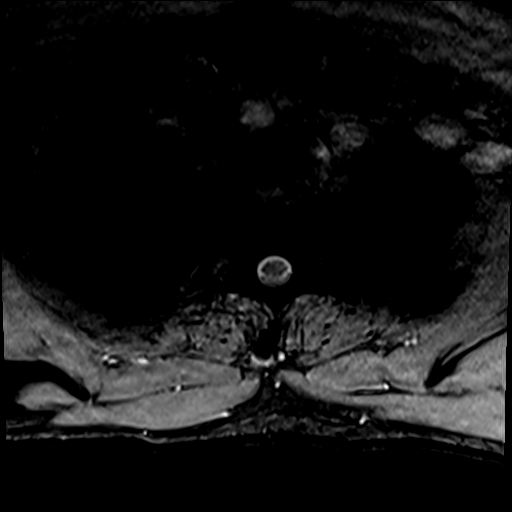
[im 8/40]
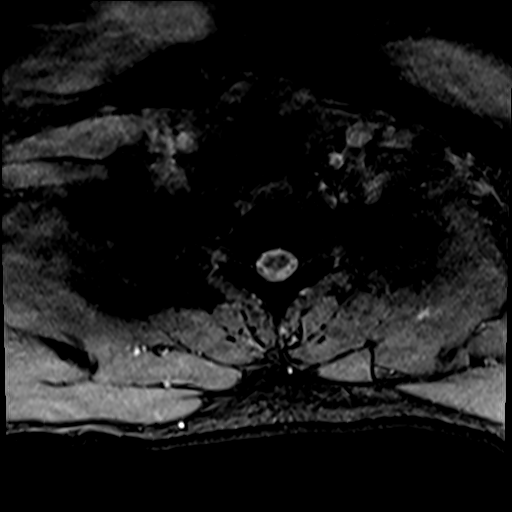
[im 14/40]
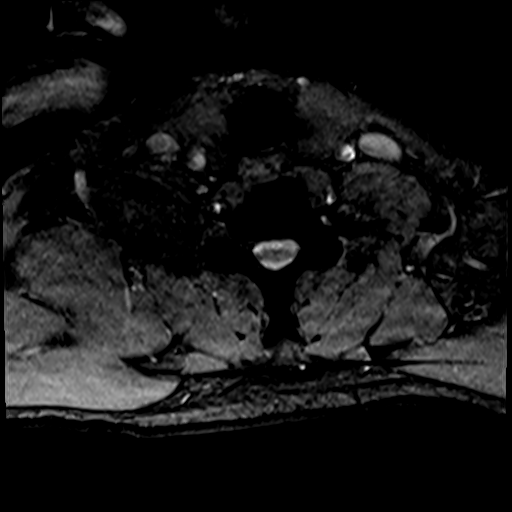
[im 19/40]
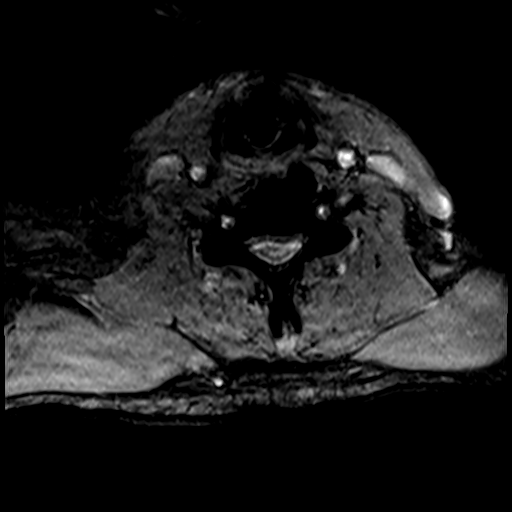
[im 24/40]
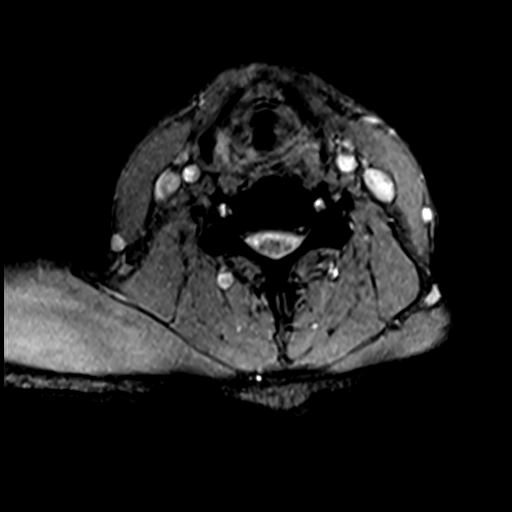
[im 29/40]
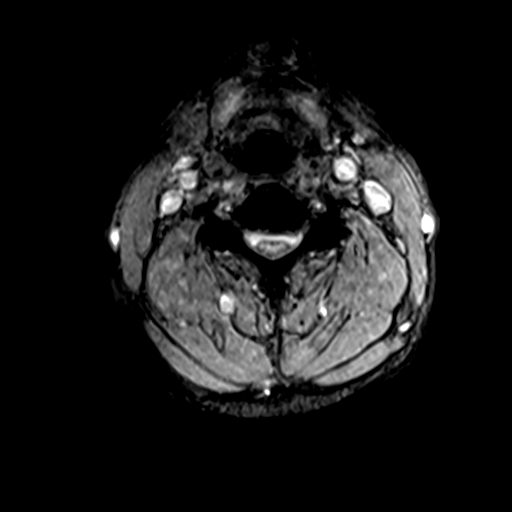
[im 34/40]
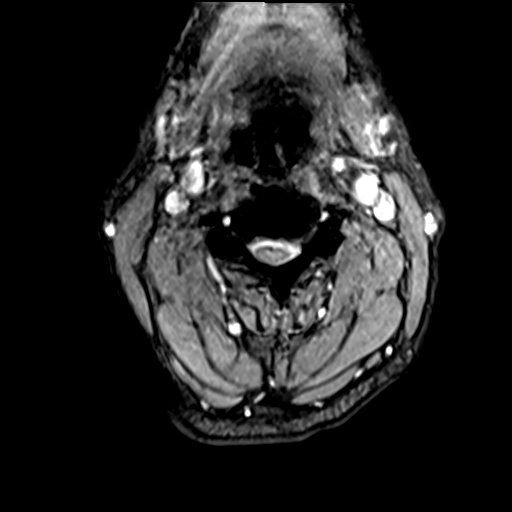
[im 40/40]
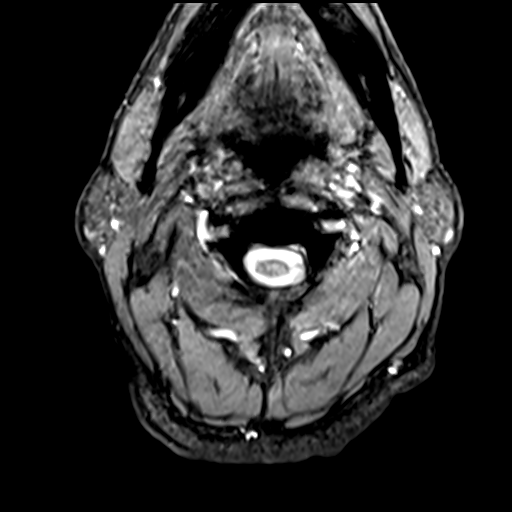

[34 of 48 positions shown; findings below may reference images not displayed]

FINDINGS: Alignment: Mild straightening of cervical lordosis compared to the
TIGER CT. No significant spondylolisthesis.

Vertebrae: Faint degenerative appearing endplate marrow edema at
C5-C6. Background heterogeneous bone marrow signal, although more
normal marrow signal at the skull base.

Cord: Suboptimal cord detail on axial images today due to motion.
Suggestion of a short segment of cervical spinal cord myelomalacia
at C6-C7 on sagittal series 13, image 10, corresponding to 1 level
of degenerative spinal stenosis. Above and below that level no
definite cord signal abnormality.

Posterior Fossa, vertebral arteries, paraspinal tissues:
Cervicomedullary junction is within normal limits. See brain
findings today reported separately. Abnormal right ICA flow void in
the neck just distal to the bifurcation. Other major arterial flow
voids in the neck appear preserved and this is concordant with the
CTA yesterday.

Otherwise negative visible neck soft tissues, lung apices.

Disc levels:

C2-C3: Mild circumferential disc bulge and endplate spurring. No
spinal stenosis. Mild to moderate C3 foraminal stenosis.

C3-C4: Disc space loss with circumferential disc bulge and endplate
spurring eccentric to the left. Mild spinal stenosis. No definite
cord mass effect. Moderate to severe left greater than right C4
foraminal stenosis.

C4-C5: Circumferential disc bulging with mild endplate spurring. No
stenosis.

C5-C6: More lobulated disc osteophyte complex eccentric to the left
and best seen on series 15, image 20. Spinal stenosis with mild left
hemi cord mass effect. Moderate left and severe right C6 foraminal
stenosis.

C6-C7: Circumferential disc osteophyte complex with broad-based
posterior component. Mild spinal stenosis. Mild if any cord mass
effect. Severe bilateral C7 foraminal stenosis.

C7-T1: Mild facet hypertrophy greater on the right. Mild left and
mild to moderate right C8 foraminal stenosis.

Visible upper thoracic spine degeneration but no upper thoracic
spinal stenosis.
IMPRESSION: 1. Multilevel cervical disc and endplate degeneration. Multilevel
mild spinal stenosis with mild cord mass effect at 1 or more levels.
Questionable spinal cord myelomalacia at C6-C7 where mild spinal and
severe bilateral foraminal stenosis are noted.
2. No other spinal cord signal abnormality identified. Moderate to
severe neural foraminal stenosis also at the bilateral C4, C6, and
right C8 nerve levels.
3. Abnormal right ICA as demonstrated by CTA yesterday.

Preliminary report of the above discussed by telephone with Dr.
TIGER on [DATE] at [IJ] hours.

## 2020-07-10 IMAGING — MR MR HEAD W/O CM
12 of 13 series · 41 of 48 positions shown · non-contrast
Comparison: CT head, CTA head and neck yesterday.
COMPARISON: CT head, CTA head and neck yesterday.

Addendum:
CLINICAL DATA: 54-year-old male with left side numbness. Right ICA
occlusion with attenuated right MCA and evidence of acute to
subacute right MCA territory infarct on CT/CTA yesterday. No
tPA/thrombectomy.

EXAM:
MRI HEAD WITHOUT CONTRAST
TECHNIQUE: Multiplanar, multiecho pulse sequences of the brain and surrounding
structures were obtained without intravenous contrast.

[Series 5: DWI · axial · 3.0mm · 0.88mm/px · z∈[-107,+45]mm · 8 of 104 slices shown (1 of 4)]
[im 1/104]
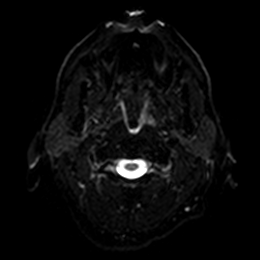
[im 15/104]
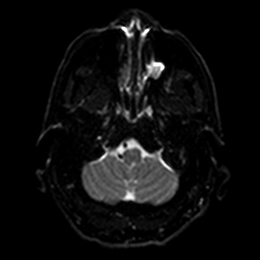
[im 30/104]
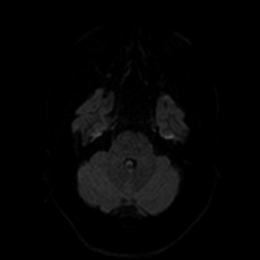
[im 45/104]
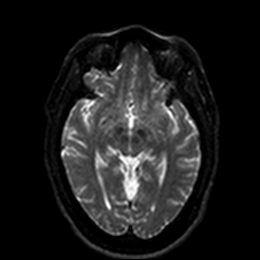
[im 59/104]
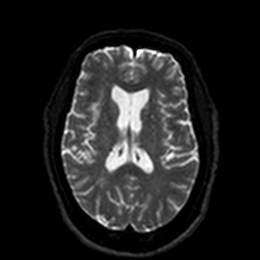
[im 74/104]
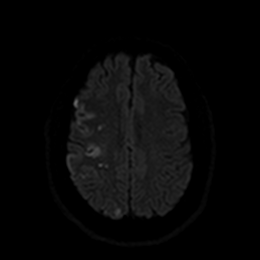
[im 89/104]
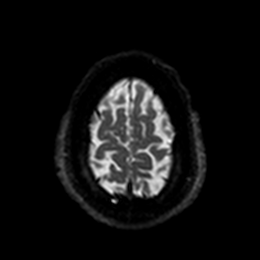
[im 104/104]
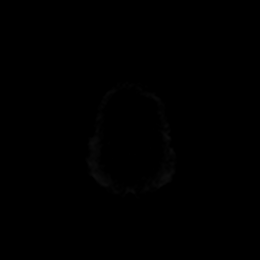

[Series 6: DWI · axial · 3.0mm · 0.88mm/px · z∈[-107,+45]mm · 4 of 52 slices shown (2 of 4)]
[im 1/52]
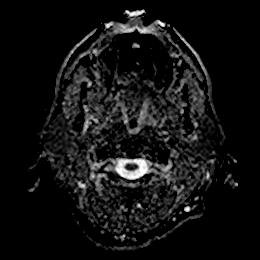
[im 18/52]
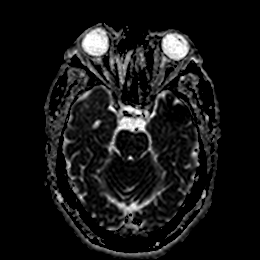
[im 35/52]
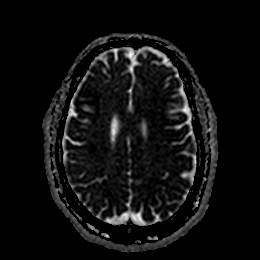
[im 52/52]
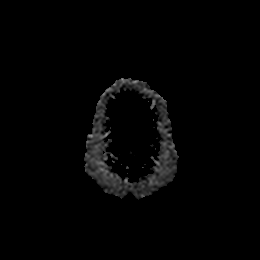

[Series 7: DWI · coronal · 4.0mm · 0.88mm/px · 5 of 72 slices shown (3 of 4)]
[im 1/72]
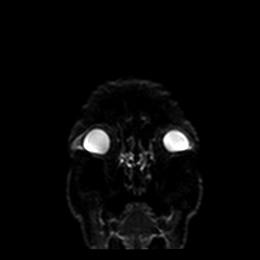
[im 18/72]
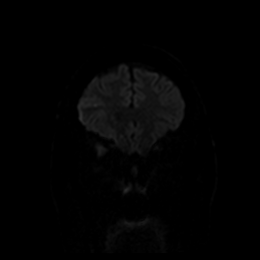
[im 36/72]
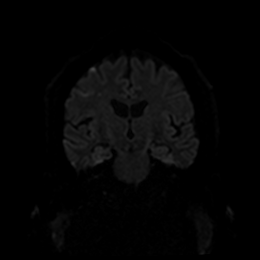
[im 54/72]
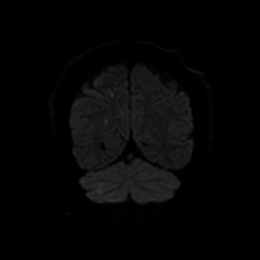
[im 72/72]
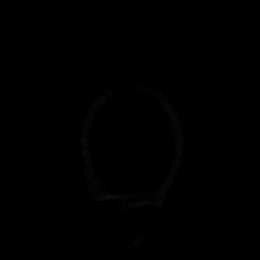

[Series 8: DWI · coronal · 4.0mm · 0.88mm/px · 3 of 36 slices shown (4 of 4)]
[im 1/36]
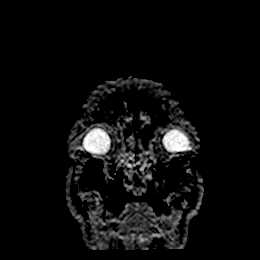
[im 18/36]
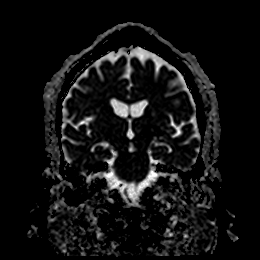
[im 36/36]
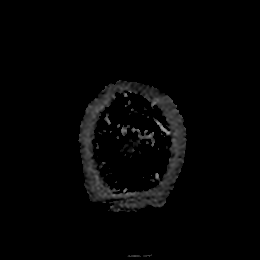

[Series 9: T1 · sagittal · 5.0mm · 0.75mm/px · 2 of 23 slices shown]
[im 1/23]
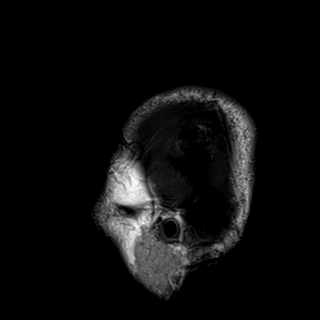
[im 23/23]
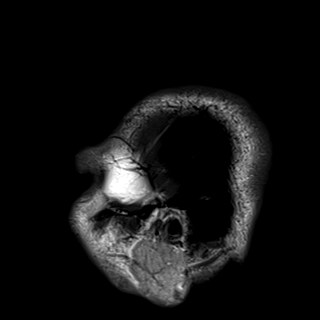

[Series 10: T2 · axial · 5.0mm · 0.72mm/px · z∈[-112,+50]mm · 2 of 28 slices shown (1 of 2)]
[im 1/28]
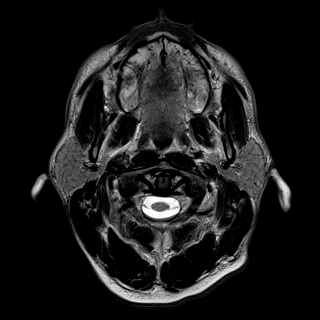
[im 28/28]
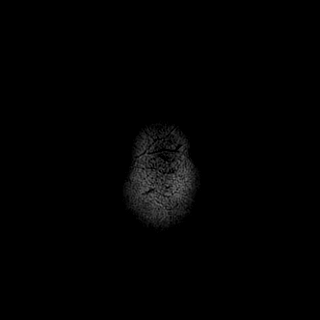

[Series 11: FLAIR · axial · 5.0mm · 0.45mm/px · z∈[-110,+51]mm · 2 of 28 slices shown]
[im 1/28]
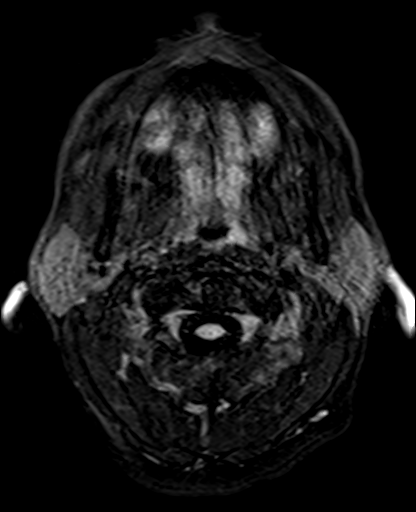
[im 28/28]
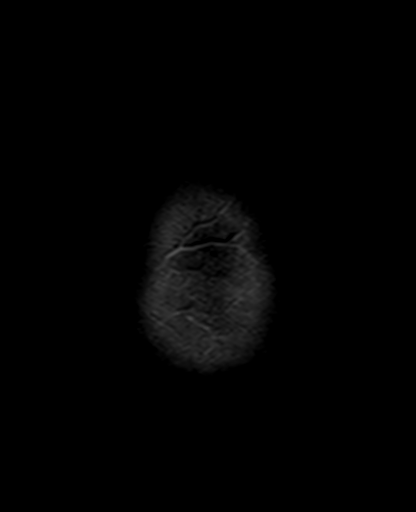

[Series 12: mag_images · axial · 3.0mm · 0.90mm/px · z∈[-112,+52]mm · 4 of 56 slices shown]
[im 1/56]
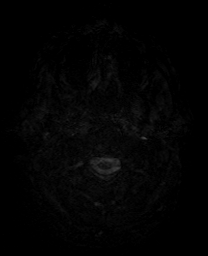
[im 19/56]
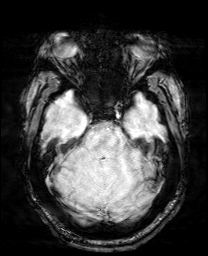
[im 37/56]
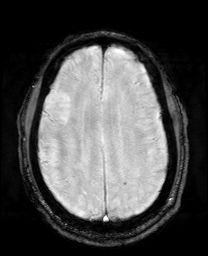
[im 56/56]
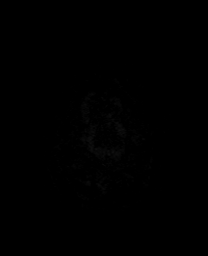

[Series 13: pha_images · axial · 3.0mm · 0.90mm/px · z∈[-112,+52]mm · 4 of 56 slices shown]
[im 1/56]
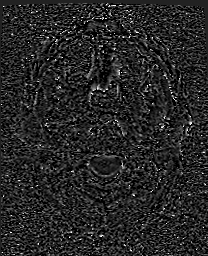
[im 19/56]
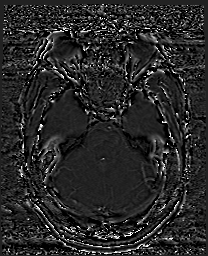
[im 37/56]
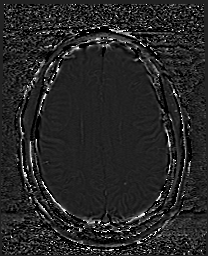
[im 56/56]
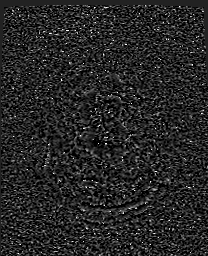

[Series 14: swi_images · axial · 3.0mm · 0.90mm/px · z∈[-112,+52]mm · 4 of 56 slices shown]
[im 1/56]
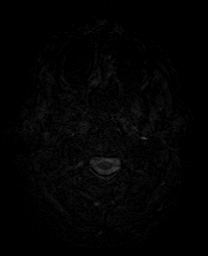
[im 19/56]
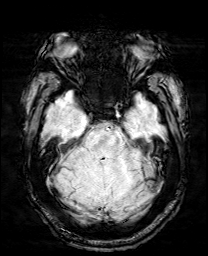
[im 37/56]
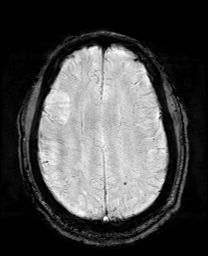
[im 56/56]
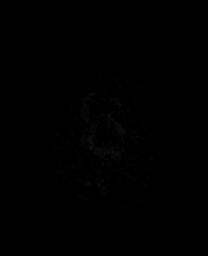

[Series 15: mip_images(sw) · axial · 24.0mm · 0.90mm/px · 1 of 49 slices shown]
[im 1/49]
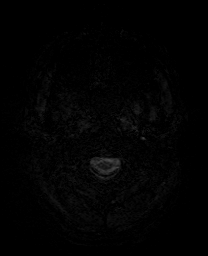

[Series 17: T2 · coronal · 5.0mm · 0.34mm/px · 2 of 31 slices shown (2 of 2)]
[im 1/31]
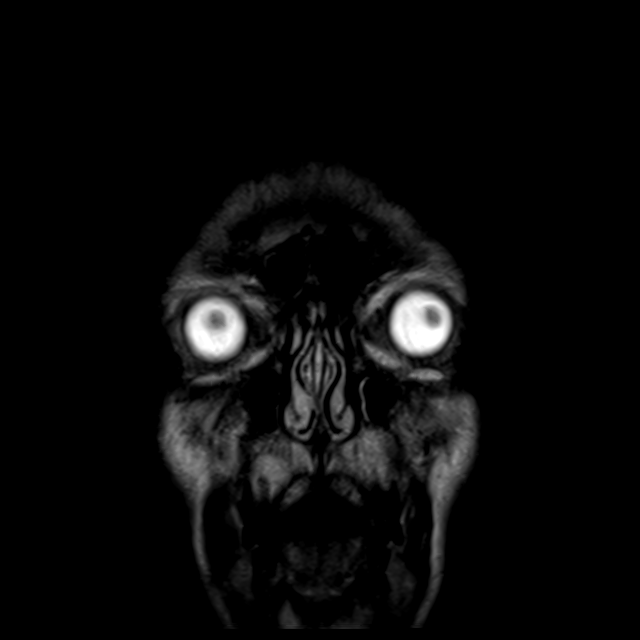
[im 31/31]
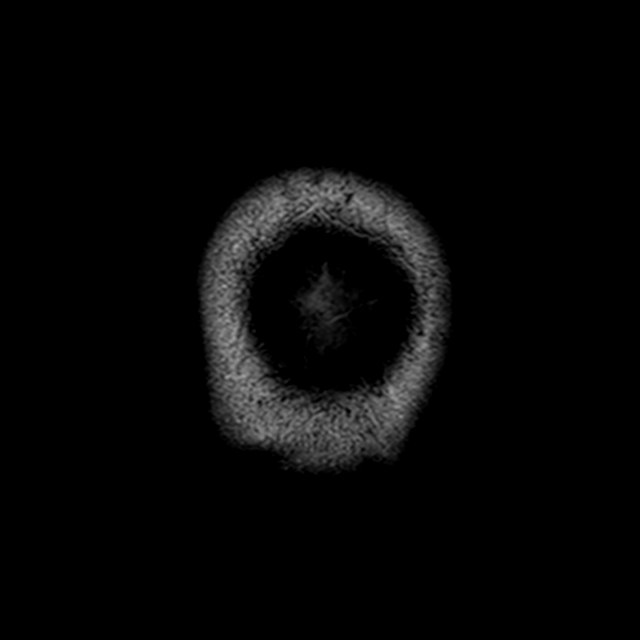

[41 of 48 positions shown; findings below may reference images not displayed]

FINDINGS: Brain: Widespread patchy and scattered cortical and subcortical
white matter restricted diffusion in the right MCA territory mostly
affecting the right middle frontal gyrus and the mid right parietal
lobe. Confluent involvement at the right frontal operculum
corresponding to the CT finding on series 5, image 87. The deep
white matter and deep gray nuclei are relatively spared. There are
scattered small infarcts also in the right occipital lobe laterally.
Cytotoxic edema. Petechial hemorrhage on series 14, image 43.

Additionally there are several small linear foci of restricted
diffusion in the right cerebellum (series 5, image 65). No other
posterior fossa restricted diffusion. No left cerebral hemisphere
restricted diffusion. Furthermore, there is a small volume of
intraventricular hemorrhage in the 4th ventricle, not apparent on
the CT yesterday.

Chronic microhemorrhage in the left parietal lobe. No other IVH. No
midline shift, mass effect, evidence of mass lesion,
ventriculomegaly, or other hemorrhage. Cervicomedullary junction and
pituitary are within normal limits.

No significant signal abnormality in the brain parenchyma beyond the
above findings.

Vascular: Loss of the right ICA flow void in the neck and through
the anterior genu with some reconstitution at the terminus. There is
a right MCA M1 flow void. Other major intracranial vascular flow
voids are preserved.

Skull and upper cervical spine: Cervical spine is detailed
separately today. Skull bone marrow signal is within normal limits.

Sinuses/Orbits: Orbit motion artifact. No orbits soft tissue
abnormality identified. Mucous retention cyst left maxillary sinus
and mild or trace sinus mucosal thickening elsewhere.

Other: Trace left mastoid effusion. Grossly normal visible internal
auditory structures. Negative visible scalp and face.
IMPRESSION: 1. Abnormal right ICA as seen by CTA with widely scattered generally
small cortical and subcortical white matter infarcts in the right
MCA territory. Some involvement of the lateral right PCA/watershed
area. Most confluent involvement in the right frontal operculum
corresponds to the CT abnormality.
2. Small acute infarct also in the right cerebellum.
3. There is both petechial hemorrhage in the posterior right MCA
territory as well as a small volume of Intraventricular Hemorrhage
in the 4th ventricle, which was not apparent by CT yesterday. But no
other IVH or extra-axial blood is identified.
No significant intracranial mass effect.
4. Generally negative for age MRI appearance of the brain elsewhere.
Cervical Spine MRI is reported separately.

ADDENDUM:
This study was discussed by telephone with Dr. JAITEH on
[DATE] at [EH] hours.

*** End of Addendum ***
FINDINGS: Brain: Widespread patchy and scattered cortical and subcortical
white matter restricted diffusion in the right MCA territory mostly
affecting the right middle frontal gyrus and the mid right parietal
lobe. Confluent involvement at the right frontal operculum
corresponding to the CT finding on series 5, image 87. The deep
white matter and deep gray nuclei are relatively spared. There are
scattered small infarcts also in the right occipital lobe laterally.
Cytotoxic edema. Petechial hemorrhage on series 14, image 43.

Additionally there are several small linear foci of restricted
diffusion in the right cerebellum (series 5, image 65). No other
posterior fossa restricted diffusion. No left cerebral hemisphere
restricted diffusion. Furthermore, there is a small volume of
intraventricular hemorrhage in the 4th ventricle, not apparent on
the CT yesterday.

Chronic microhemorrhage in the left parietal lobe. No other IVH. No
midline shift, mass effect, evidence of mass lesion,
ventriculomegaly, or other hemorrhage. Cervicomedullary junction and
pituitary are within normal limits.

No significant signal abnormality in the brain parenchyma beyond the
above findings.

Vascular: Loss of the right ICA flow void in the neck and through
the anterior genu with some reconstitution at the terminus. There is
a right MCA M1 flow void. Other major intracranial vascular flow
voids are preserved.

Skull and upper cervical spine: Cervical spine is detailed
separately today. Skull bone marrow signal is within normal limits.

Sinuses/Orbits: Orbit motion artifact. No orbits soft tissue
abnormality identified. Mucous retention cyst left maxillary sinus
and mild or trace sinus mucosal thickening elsewhere.

Other: Trace left mastoid effusion. Grossly normal visible internal
auditory structures. Negative visible scalp and face.
IMPRESSION: 1. Abnormal right ICA as seen by CTA with widely scattered generally
small cortical and subcortical white matter infarcts in the right
MCA territory. Some involvement of the lateral right PCA/watershed
area. Most confluent involvement in the right frontal operculum
corresponds to the CT abnormality.
2. Small acute infarct also in the right cerebellum.
3. There is both petechial hemorrhage in the posterior right MCA
territory as well as a small volume of Intraventricular Hemorrhage
in the 4th ventricle, which was not apparent by CT yesterday. But no
other IVH or extra-axial blood is identified.
No significant intracranial mass effect.
4. Generally negative for age MRI appearance of the brain elsewhere.
Cervical Spine MRI is reported separately.

## 2020-07-10 MED ORDER — HYDRALAZINE HCL 20 MG/ML IJ SOLN: 10.0000 mg | INTRAMUSCULAR | Status: DC | PRN

## 2020-07-10 MED ORDER — POTASSIUM CHLORIDE 10 MEQ/100ML IV SOLN
10.0000 meq | INTRAVENOUS | Status: AC
  Administered 2020-07-10 (×2): 10 meq via INTRAVENOUS
  Filled 2020-07-10 (×3): qty 100

## 2020-07-10 MED ORDER — NICOTINE 14 MG/24HR TD PT24
14.0000 mg | MEDICATED_PATCH | Freq: Every day | TRANSDERMAL | Status: DC
  Administered 2020-07-10 – 2020-07-12 (×3): 14 mg via TRANSDERMAL
  Filled 2020-07-10 (×3): qty 1

## 2020-07-10 MED ORDER — ACETAMINOPHEN 650 MG RE SUPP: 650.0000 mg | RECTAL | Status: DC | PRN

## 2020-07-10 MED ORDER — LISINOPRIL 20 MG PO TABS
20.0000 mg | ORAL_TABLET | Freq: Every day | ORAL | Status: DC
  Administered 2020-07-10 – 2020-07-12 (×3): 20 mg via ORAL
  Filled 2020-07-10 (×3): qty 1

## 2020-07-10 MED ORDER — POTASSIUM CHLORIDE 10 MEQ/100ML IV SOLN
10.0000 meq | Freq: Once | INTRAVENOUS | Status: AC
  Administered 2020-07-10: 10 meq via INTRAVENOUS
  Filled 2020-07-10: qty 100

## 2020-07-10 MED ORDER — ACETAMINOPHEN 160 MG/5ML PO SOLN: 650.0000 mg | ORAL | Status: DC | PRN

## 2020-07-10 MED ORDER — HYDROCHLOROTHIAZIDE 12.5 MG PO CAPS
12.5000 mg | ORAL_CAPSULE | Freq: Every day | ORAL | Status: DC
  Administered 2020-07-10 – 2020-07-12 (×3): 12.5 mg via ORAL
  Filled 2020-07-10 (×3): qty 1

## 2020-07-10 MED ORDER — ENOXAPARIN SODIUM 40 MG/0.4ML IJ SOSY
40.0000 mg | PREFILLED_SYRINGE | INTRAMUSCULAR | Status: DC
  Administered 2020-07-10 – 2020-07-12 (×3): 40 mg via SUBCUTANEOUS
  Filled 2020-07-10 (×3): qty 0.4

## 2020-07-10 MED ORDER — ACETAMINOPHEN 325 MG PO TABS: 650.0000 mg | ORAL_TABLET | ORAL | Status: DC | PRN

## 2020-07-10 MED ORDER — AMLODIPINE BESYLATE 10 MG PO TABS
10.0000 mg | ORAL_TABLET | Freq: Every day | ORAL | Status: DC
  Administered 2020-07-10 – 2020-07-12 (×3): 10 mg via ORAL
  Filled 2020-07-10 (×3): qty 1

## 2020-07-10 MED ORDER — ATORVASTATIN CALCIUM 40 MG PO TABS
40.0000 mg | ORAL_TABLET | Freq: Every day | ORAL | Status: DC
  Administered 2020-07-10 – 2020-07-12 (×3): 40 mg via ORAL
  Filled 2020-07-10 (×3): qty 1

## 2020-07-10 MED ORDER — LISINOPRIL-HYDROCHLOROTHIAZIDE 20-12.5 MG PO TABS: 1.0000 | ORAL_TABLET | Freq: Every day | ORAL | Status: DC

## 2020-07-10 MED ORDER — ASPIRIN EC 81 MG PO TBEC: 81.0000 mg | DELAYED_RELEASE_TABLET | Freq: Every day | ORAL | Status: DC

## 2020-07-10 MED ORDER — SENNOSIDES-DOCUSATE SODIUM 8.6-50 MG PO TABS: 1.0000 | ORAL_TABLET | Freq: Every evening | ORAL | Status: DC | PRN

## 2020-07-10 MED ORDER — STROKE: EARLY STAGES OF RECOVERY BOOK
Freq: Once | Status: AC
  Administered 2020-07-10: 1
  Filled 2020-07-10: qty 1

## 2020-07-10 NOTE — Progress Notes (Signed)
Brief Neuro Update:  Notified by Radiology about small amount of IVH in the 4th ventricle. Discontinued Aspirin and plavix for now. Will get repeat imaging with CTH w/o contrast in 6 hours.  Erick Blinks [facility] Pager Number [phone]

## 2020-07-10 NOTE — Evaluation (Signed)
Speech Language Pathology Evaluation Patient Details Name: Robert [name] MRN: [id number] DOB: October 16, 1965 Today's Date: 07/10/2020 Time: 1950-9326 SLP Time Calculation (min) (ACUTE ONLY): 21 min  Problem List:  Patient Active Problem List   Diagnosis Date Noted  . Tobacco use 07/10/2020  . Acute CVA (cerebrovascular accident) (HCC) 07/09/2020  . Cervical radiculopathy 04/26/2020  . Left upper extremity numbness 04/26/2020  . 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 HTN (hypertension) 11/04/2014   Past Medical History:  Past Medical History:  Diagnosis Date  . Hypertension    Past Surgical History:  Past Surgical History:  Procedure Laterality Date  . TONSILLECTOMY AND ADENOIDECTOMY     HPI:  Robert [name] is a 55 y.o. male with medical history significant for hypertension and tobacco use who presents to the ED for evaluation of intermittent left hand weakness.  MRI 5/2: "IMPRESSION: 1. Abnormal right ICA as seen by CTA with widely scattered generally small cortical and subcortical white matter infarcts in the right MCA territory. Some involvement of the lateral right PCA/watershed area. Most confluent involvement in the right frontal operculum corresponds to the CT abnormality. 2. Small acute infarct also in the right cerebellum. 3. There is both petechial hemorrhage in the posterior right MCA  territory as well as a small volume of Intraventricular Hemorrhage in the 4th ventricle, which was not apparent by CT yesterday. But no other IVH or extra-axial blood is identified. No significant intracranial mass effect. 4. Generally negative for age MRI appearance of the brain elsewhere."   Assessment / Plan / Recommendation Clinical Impression  Pt presents with mild cognitive linguist deficits in setting of recent CVA.  Pt was assessed using the COGNISTAT (see below for additional information).  Although pt exhibited errors in command following, this does not reprsent a receptive language deficit,  but is 2/2 L neglect, which prevented pt from scanning full array of objects.  Pt also did not describe L side of image in picture description task.  Pt benefited from minimal cuing to attent to L side.  Pt exhibited deficits in memory and calculation, which he states is consistent with baseline function.  Pt may also benefit from compensatory strategies and/or further assessment of this functional areas.  Pt states that he is retired and lives with his mother who will be able to help with cooking.  He manages his own finances and medications.  Pt reports concerns about being able to write.  He is left handed and   Pt's language function appears WNL.  He did not exhibit any word finding difficulties in conversation.  Pt's speech is clear and free from dysarthria.  He denies any changes to speech.  Pt would benefit from speech therapy to address the above noted deficits.  He will likely benefit from ST at next level of care as well.  COGNISTAT: All subtests are within the average range, except where otherwise specified.  Orientation:  10/12 Attention: 8/8 Comprehension: 4/6, Mild impairment - errors in this task are most likely 2/2 L neglect, not a true language deficit Repetition: 12/12 Naming: 8/8 Construction: not assessed (suspect pt would have impairments 2/2 L neglect) Memory: 6/12, moderate impairment Calculations: 2/4, mild impairment Similarities: 5/8 Judgment: 4/6     SLP Assessment  SLP Recommendation/Assessment: Patient needs continued Speech Lanaguage Pathology Services SLP Visit Diagnosis: Cognitive communication deficit (R41.841)    Follow Up Recommendations  Outpatient SLP;Home health SLP;Inpatient Rehab (Continue ST at next level of care)    Frequency and Duration min 2x/week  2 weeks      SLP Evaluation Cognition  Overall Cognitive Status: Impaired/Different from baseline Orientation Level: Oriented X4 Attention: Focused;Sustained Focused Attention: Appears  intact Sustained Attention: Appears intact Memory: Impaired Memory Impairment: Decreased short term memory Decreased Short Term Memory: Verbal basic Awareness: Impaired       Comprehension  Auditory Comprehension Overall Auditory Comprehension: Appears within functional limits for tasks assessed Commands: Within Functional Limits (Impaired, but 2/2 L neglect) Interfering Components: Attention Reading Comprehension Reading Status: Not tested    Expression Expression Primary Mode of Expression: Verbal Verbal Expression Overall Verbal Expression: Appears within functional limits for tasks assessed Initiation: No impairment Repetition: No impairment Naming: No impairment Pragmatics: No impairment Written Expression Dominant Hand: Left Written Expression: Not tested   Oral / Motor  Motor Speech Overall Motor Speech: Appears within functional limits for tasks assessed Respiration: Within functional limits Phonation: Normal Resonance: Within functional limits Articulation: Within functional limitis Intelligibility: Intelligible Motor Planning: Witnin functional limits Motor Speech Errors: Not applicable   GO                    Kerrie Pleasure, MA, CCC-SLP Acute Rehabilitation Services Office: [phone] 07/10/2020, 11:18 AM

## 2020-07-10 NOTE — Progress Notes (Signed)
PT Cancellation Note  Patient Details Name: Robert [name] MRN: [id number] DOB: 1965-06-02   Cancelled Treatment:    Reason Eval/Treat Not Completed: Active bedrest order. Per MD, pt to remain on bedrest until follow-up CT. Will continue to follow and initiate PT evaluation when appropriate.    Marylynn Pearson 07/10/2020, 1:30 PM  Conni Slipper, PT, DPT Acute Rehabilitation Services Pager: [phone] Office: [phone]

## 2020-07-10 NOTE — ED Notes (Signed)
Lab to add on A1c and lipid panel  

## 2020-07-10 NOTE — Plan of Care (Signed)

## 2020-07-10 NOTE — Progress Notes (Signed)
PROGRESS NOTE  Robert [name] ZOX:[id number] DOB: [birth year] DOA: 07/09/2020 PCP: Pcp, No   LOS: 0 days   Brief Narrative / Interim history: Robert [name] with HTN, tobacco use, comes into the hospital with left hand weakness.  This has been intermittent over several months.  He also tells me that occasionally he is noticing numbness in his left side, slurred speech and facial droop.  This comes and goes.  Symptoms have gotten significantly worse with more weakness in his left hand 5 to 6 days ago.  He is left-handed and realized that he is unable to write any more and came to the hospital.  Subjective / 24h Interval events: Denies any chest pain, no shortness of breath.  Weakness is persistent in his left arm.  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  There are also small infarct in the right cerebellum.  CT angiogram showed abrupt occlusion of right ICA just distal to the bifurcation.  He was initially placed on dual antiplatelet therapy with aspirin and Plavix however on the MRI it was also evident some additional petechial hemorrhage in the posterior right MCA territory and small volume intraventricular hemorrhage in the fourth ventricle which was not not apparent in the original CT.  His dual antiplatelet therapy is now on hold.  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 enoxaparin (LOVENOX) injection  40 mg Subcutaneous Q24H   Continuous Infusions: . sodium chloride 100 mL/hr at 07/10/20 0849  . potassium chloride 10 mEq (07/10/20 0848)   PRN Meds:.acetaminophen **OR** acetaminophen  (TYLENOL) oral liquid 160 mg/5 mL **OR** acetaminophen, hydrALAZINE, senna-docusate  Diet Orders (From admission, onward)    Start     Ordered   07/10/20 0536  Diet regular Room service appropriate? Yes; Fluid consistency: Thin  Diet effective now       Question Answer Comment  Room service appropriate? Yes   Fluid consistency: Thin      07/10/20 0536          DVT prophylaxis: enoxaparin (LOVENOX) injection 40 mg Start: 07/10/20 1000     Code Status: Full Code  Family Communication: No family at bedside  Status is: Observation  The patient will require care spanning > 2 midnights and should be moved to inpatient because: Inpatient level of care appropriate due to severity of illness  Dispo: The patient is from: Home              Anticipated d/c is to: Home              Patient currently is not medically stable to d/c.   Difficult to place patient No  Level of care: Progressive  Consultants:  Neurology  Procedures:  2D echo: Pending  Microbiology  none  Antimicrobials: none    Objective: Vitals:   07/10/20 0237 07/10/20 0354 07/10/20 0550 07/10/20 0758  BP: (!) 155/92 (!) 144/81 (!) 150/80 (!) 183/101  Pulse: 62 (!) 55 61 72  Resp: Temp: 98.4 F (36.9 C) 98.1 F (36.7 C) 98.5 F (36.9 C) 98.9 F (37.2 C)  TempSrc: Oral Oral Oral Oral  SpO2: 100% 100% 100% 100%  Intake/Output Summary (Last 24 hours) at 07/10/2020 1610 Last data filed at 07/10/2020 9604 Gross per 24 hour  Intake 733.9 ml  Output --  Net 733.9 ml   There were no vitals filed for this visit.  Examination:  Constitutional: NAD Eyes: no scleral icterus ENMT: Mucous membranes are moist.  Neck: normal, supple Respiratory: clear to auscultation bilaterally, no wheezing, no crackles. Normal respiratory effort. Cardiovascular: Regular rate and rhythm, no murmurs / rubs / gallops. No LE edema.  Abdomen: non distended, no tenderness. Bowel sounds positive.  Musculoskeletal:  no clubbing / cyanosis.  Skin: no rashes Neurologic: Left upper extremity weakness, good strength otherwise.  Left facial droop, slurred speech  Data Reviewed: I have independently reviewed following labs and imaging studies   CBC: Recent Labs  Lab 07/09/20 2023 07/10/20 0301  WBC 7.8 7.0  NEUTROABS 5.6  --   HGB 13.6 12.5*  HCT 40.4 37.6*  MCV 94.4 94.5  PLT 231 214   Basic Metabolic Panel: Recent Labs  Lab 07/09/20 2023 07/10/20 0301  NA 138 138  K 3.3* 3.9  CL 102 105  CO2 28 24  GLUCOSE 134* 81  BUN 17 16  CREATININE 1.28* 1.17  CALCIUM 8.9 8.6*   Liver Function Tests: Recent Labs  Lab 07/09/20 2023  AST 28  ALT 16  ALKPHOS 107  BILITOT 0.8  PROT 7.0  ALBUMIN 3.6   Coagulation Profile: Recent Labs  Lab 07/09/20 2023  INR 1.0   HbA1C: Recent Labs    07/10/20 0019  HGBA1C 4.5*   CBG: Recent Labs  Lab 07/09/20 2000  GLUCAP 168*    Recent Results (from the past 240 hour(s))  Resp Panel by RT-PCR (Flu A&B, Covid) Nasopharyngeal Swab     Status: None   Collection Time: 07/10/20 12:40 AM   Specimen: Nasopharyngeal Swab; Nasopharyngeal(NP) swabs in vial transport medium  Result Value Ref Range Status   SARS Coronavirus 2 by RT PCR NEGATIVE NEGATIVE Final    Comment: (NOTE) SARS-CoV-2 target nucleic acids are NOT DETECTED.  The SARS-CoV-2 RNA is generally detectable in upper respiratory specimens during the acute phase of infection. The lowest concentration of SARS-CoV-2 viral copies this assay can detect is 138 copies/mL. A negative result does not preclude SARS-Cov-2 infection and should not be used as the sole basis for treatment or other patient management decisions. A negative result may occur with  improper specimen collection/handling, submission of specimen other than nasopharyngeal swab, presence of viral mutation(s) within the areas targeted by this assay, and inadequate number of viral copies(<138 copies/mL). A negative result must  be combined with clinical observations, patient history, and epidemiological information. The expected result is Negative.  Fact Sheet for Patients:  BloggerCourse.com  Fact Sheet for Healthcare Providers:  SeriousBroker.it  This test is no t yet approved or cleared by the [place] FDA and  has been authorized for detection and/or diagnosis of SARS-CoV-2 by FDA under an Emergency Use Authorization (EUA). This EUA will remain  in effect (meaning this test can be used) for the duration of the COVID-19 declaration under Section 564(b)(1) of the Act, 21 U.S.C.section 360bbb-3(b)(1), unless the authorization is terminated  or revoked sooner.       Influenza A by PCR NEGATIVE NEGATIVE Final   Influenza B by PCR NEGATIVE NEGATIVE Final    Comment: (NOTE) The Xpert Xpress SARS-CoV-2/FLU/RSV plus assay is intended as an aid in the diagnosis of influenza from Nasopharyngeal swab specimens and should not be used  as a sole basis for treatment. Nasal washings and aspirates are unacceptable for Xpert Xpress SARS-CoV-2/FLU/RSV testing.  Fact Sheet for Patients: BloggerCourse.comhttps://www.fda.gov/media/152166/download  Fact Sheet for Healthcare Providers: SeriousBroker.ithttps://www.fda.gov/media/152162/download  This test is not yet approved or cleared by the [place] FDA and has been authorized for detection and/or diagnosis of SARS-CoV-2 by FDA under an Emergency Use Authorization (EUA). This EUA will remain in effect (meaning this test can be used) for the duration of the COVID-19 declaration under Section 564(b)(1) of the Act, 21 U.S.C. section 360bbb-3(b)(1), unless the authorization is terminated or revoked.  Performed at [facility], [address] [place], [place] [id number]      Radiology Studies: CT ANGIO HEAD W OR WO CONTRAST  Result Date: 07/09/2020 CLINICAL DATA:  Initial evaluation for acute stroke. EXAM: CT ANGIOGRAPHY HEAD AND NECK  TECHNIQUE: Multidetector CT imaging of the head and neck was performed using the standard protocol during bolus administration of intravenous contrast. Multiplanar CT image reconstructions and MIPs were obtained to evaluate the vascular anatomy. Carotid stenosis measurements (when applicable) are obtained utilizing NASCET criteria, using the distal internal carotid diameter as the denominator. CONTRAST:  50mL OMNIPAQUE IOHEXOL 350 MG/ML SOLN COMPARISON:  Prior head CT from earlier the same day. FINDINGS: CTA NECK FINDINGS Aortic arch: Examination somewhat technically limited by timing of the contrast bolus. Visualized aortic arch normal in caliber with normal 3 vessel morphology. No hemodynamically significant stenosis seen about the origin of the great vessels. Mild eccentric soft plaque noted within the distal aspect of the aortic arch itself. Right carotid system: Right CCA patent from its origin to the bifurcation without stenosis. There is abrupt occlusion of the right ICA just distal to the bifurcation (series 7, image 202). Right ICA remains occluded within the neck. Left carotid system: Left common carotid artery patent from its origin to the bifurcation without stenosis. Mild atheromatous irregularity about the left carotid bulb/proximal left ICA without significant stenosis. Left ICA patent distally without stenosis, dissection or occlusion. Vertebral arteries: Both vertebral arteries arise from the subclavian arteries. No proximal subclavian artery stenosis. Vertebral arteries patent without stenosis, dissection or occlusion. Skeleton: No visible acute osseous abnormality. No discrete or worrisome osseous lesions. Other neck: No other acute soft tissue abnormality within the neck. No mass or adenopathy. Upper chest: Visualized upper chest demonstrates no acute finding. Hypodensity noted within the distal left main pulmonary artery (series 5, image 180), favored to be artifactual and/or mixing artifact.  Review of the MIP images confirms the above findings CTA HEAD FINDINGS Anterior circulation: Left internal carotid artery widely patent to the terminus without stenosis. Right ICA occluded at the skull base. Distal reconstitution at the supraclinoid segment via collateral flow across the circle-of-Willis. Left A1 segment widely patent. There is a focal severe distal right A1 stenosis (series 8, image 113). Normal anterior communicating artery complex. Anterior cerebral arteries remain well perfused to their distal aspects. Mildly attenuated flow within the right MCA as compared to the left. Right M1 segment remains perfused without stenosis. Normal right MCA bifurcation. Distal right MCA branches well perfused without visible downstream occlusion. Left M1 segment widely patent. Normal left MCA bifurcation. Distal left MCA branches well perfused. Posterior circulation: Both V4 segments patent to the vertebrobasilar junction without stenosis. Neither PICA origin well visualized. Focal hypodensity/irregularity present at the level of the vertebrobasilar junction (series 7, image 138). There is a suspected short-segment fenestration at this level, with possible underlying severe stenosis and/or artifact. A small amount  of intraluminal thrombus somewhat difficult to exclude. The basilar artery is diminutive but patent distally. Superior cerebellar arteries grossly patent proximally. Both PCAs primarily supplied via the basilar. PCAs are perfused to their distal aspects without appreciable stenosis. Venous sinuses: Grossly patent allowing for timing the contrast bolus, although evaluation fairly limited. Anatomic variants: None significant. Review of the MIP images confirms the above findings IMPRESSION: 1. 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 Distal reconstitution at the supraclinoid segment via collateral flow across the circle-of-Willis. Attenuated but patent flow within the right MCA  distribution, with no visible downstream occlusion. 2. Focal hypodensity/irregularity at the level of the vertebrobasilar junction, indeterminate, but favored to reflect a short-segment fenestration with artifact or possible stenosis at this level. A small amount of intraluminal thrombus difficult to exclude, but felt to be less likely. If clinical symptoms should warrant, a repeat examination could be performed for further evaluation as needed. 3. Severe distal right A1 stenosis. Critical Value/emergent results were called by telephone at the time of interpretation on 07/09/2020 at approximately 10:45 pm to provider [facility] , who verbally acknowledged these results. Electronically Signed   By: Rise Mu M.D.   On: 07/09/2020 23:41   CT HEAD WO CONTRAST  Result Date: 07/09/2020 CLINICAL DATA:  Initial evaluation for acute mental status change, stroke suspected. Left-sided numbness. EXAM: CT HEAD WITHOUT CONTRAST TECHNIQUE: Contiguous axial images were obtained from the base of the skull through the vertex without intravenous contrast. COMPARISON:  Prior CT from 03/07/2020. FINDINGS: Brain: Cerebral volume within normal limits for age. Subtle hypodensity seen involving the mid and posterior right frontoparietal region, concerning for acute right MCA distribution infarct. Mild patchy involvement of the right insula. No intracranial hemorrhage. No other acute large vessel territory infarct. No mass lesion, midline shift or mass effect. No hydrocephalus or extra-axial fluid collection. Vascular: No visible hyperdense vessel. Skull: Scalp soft tissues and calvarium within normal limits. Sinuses/Orbits: Globes and orbital soft tissues demonstrate no acute finding. Question mild proptosis. Few small retention cyst noted within the left maxillary sinus. Paranasal sinuses are otherwise clear. No mastoid effusion. Other: None. IMPRESSION: 1. Subtle hypodensity involving the mid and posterior right  frontoparietal region, concerning for acute right MCA distribution infarct. No intracranial hemorrhage. 2. Question mild proptosis. Correlation with physical exam recommended. Results communicated by telephone at the time of interpretation on 07/09/2020 at 9:38 pm to provider [facility] , who verbally acknowledged these results. Electronically Signed   By: Rise Mu M.D.   On: 07/09/2020 21:39   CT ANGIO NECK W OR WO CONTRAST  Result Date: 07/09/2020 CLINICAL DATA:  Initial evaluation for acute stroke. EXAM: CT ANGIOGRAPHY HEAD AND NECK TECHNIQUE: Multidetector CT imaging of the head and neck was performed using the standard protocol during bolus administration of intravenous contrast. Multiplanar CT image reconstructions and MIPs were obtained to evaluate the vascular anatomy. Carotid stenosis measurements (when applicable) are obtained utilizing NASCET criteria, using the distal internal carotid diameter as the denominator. CONTRAST:  2mL OMNIPAQUE IOHEXOL 350 MG/ML SOLN COMPARISON:  Prior head CT from earlier the same day. FINDINGS: CTA NECK FINDINGS Aortic arch: Examination somewhat technically limited by timing of the contrast bolus. Visualized aortic arch normal in caliber with normal 3 vessel morphology. No hemodynamically significant stenosis seen about the origin of the great vessels. Mild eccentric soft plaque noted within the distal aspect of the aortic arch itself. Right carotid system: Right CCA patent from its origin to the  bifurcation without stenosis. There is abrupt occlusion of the right ICA just distal to the bifurcation (series 7, image 202). Right ICA remains occluded within the neck. Left carotid system: Left common carotid artery patent from its origin to the bifurcation without stenosis. Mild atheromatous irregularity about the left carotid bulb/proximal left ICA without significant stenosis. Left ICA patent distally without stenosis, dissection or occlusion. Vertebral arteries:  Both vertebral arteries arise from the subclavian arteries. No proximal subclavian artery stenosis. Vertebral arteries patent without stenosis, dissection or occlusion. Skeleton: No visible acute osseous abnormality. No discrete or worrisome osseous lesions. Other neck: No other acute soft tissue abnormality within the neck. No mass or adenopathy. Upper chest: Visualized upper chest demonstrates no acute finding. Hypodensity noted within the distal left main pulmonary artery (series 5, image 180), favored to be artifactual and/or mixing artifact. Review of the MIP images confirms the above findings CTA HEAD FINDINGS Anterior circulation: Left internal carotid artery widely patent to the terminus without stenosis. Right ICA occluded at the skull base. Distal reconstitution at the supraclinoid segment via collateral flow across the circle-of-Willis. Left A1 segment widely patent. There is a focal severe distal right A1 stenosis (series 8, image 113). Normal anterior communicating artery complex. Anterior cerebral arteries remain well perfused to their distal aspects. Mildly attenuated flow within the right MCA as compared to the left. Right M1 segment remains perfused without stenosis. Normal right MCA bifurcation. Distal right MCA branches well perfused without visible downstream occlusion. Left M1 segment widely patent. Normal left MCA bifurcation. Distal left MCA branches well perfused. Posterior circulation: Both V4 segments patent to the vertebrobasilar junction without stenosis. Neither PICA origin well visualized. Focal hypodensity/irregularity present at the level of the vertebrobasilar junction (series 7, image 138). There is a suspected short-segment fenestration at this level, with possible underlying severe stenosis and/or artifact. A small amount of intraluminal thrombus somewhat difficult to exclude. The basilar artery is diminutive but patent distally. Superior cerebellar arteries grossly patent  proximally. Both PCAs primarily supplied via the basilar. PCAs are perfused to their distal aspects without appreciable stenosis. Venous sinuses: Grossly patent allowing for timing the contrast bolus, although evaluation fairly limited. Anatomic variants: None significant. Review of the MIP images confirms the above findings IMPRESSION: 1. 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 Distal reconstitution at the supraclinoid segment via collateral flow across the circle-of-Willis. Attenuated but patent flow within the right MCA distribution, with no visible downstream occlusion. 2. Focal hypodensity/irregularity at the level of the vertebrobasilar junction, indeterminate, but favored to reflect a short-segment fenestration with artifact or possible stenosis at this level. A small amount of intraluminal thrombus difficult to exclude, but felt to be less likely. If clinical symptoms should warrant, a repeat examination could be performed for further evaluation as needed. 3. Severe distal right A1 stenosis. Critical Value/emergent results were called by telephone at the time of interpretation on 07/09/2020 at approximately 10:45 pm to provider [facility] , who verbally acknowledged these results. Electronically Signed   By: Rise Mu M.D.   On: 07/09/2020 23:41   MR BRAIN WO CONTRAST  Addendum Date: 07/10/2020   ADDENDUM REPORT: 07/10/2020 05:54 ADDENDUM: This study was discussed by telephone with Dr. Erick Blinks on 07/10/2020 at 0544 hours. Electronically Signed   By: Odessa Fleming M.D.   On: 07/10/2020 05:54   Result Date: 07/10/2020 CLINICAL DATA:  Robert [name] with left side numbness. Right ICA occlusion with attenuated right MCA and evidence of  acute to subacute right MCA territory infarct on CT/CTA yesterday. No tPA/thrombectomy. EXAM: MRI HEAD WITHOUT CONTRAST TECHNIQUE: Multiplanar, multiecho pulse sequences of the brain and surrounding structures were obtained without  intravenous contrast. COMPARISON:  CT head, CTA head and neck yesterday. FINDINGS: Brain: Widespread patchy and scattered cortical and subcortical white matter restricted diffusion in the right MCA territory mostly affecting the right middle frontal gyrus and the mid right parietal lobe. Confluent involvement at the right frontal operculum corresponding to the CT finding on series 5, image 87. The deep white matter and deep gray nuclei are relatively spared. There are scattered small infarcts also in the right occipital lobe laterally. Cytotoxic edema. Petechial hemorrhage on series 14, image 43. Additionally there are several small linear foci of restricted diffusion in the right cerebellum (series 5, image 65). No other posterior fossa restricted diffusion. No left cerebral hemisphere restricted diffusion. Furthermore, there is a small volume of intraventricular hemorrhage in the 4th ventricle, not apparent on the CT yesterday. Chronic microhemorrhage in the left parietal lobe. No other IVH. No midline shift, mass effect, evidence of mass lesion, ventriculomegaly, or other hemorrhage. Cervicomedullary junction and pituitary are within normal limits. No significant signal abnormality in the brain parenchyma beyond the above findings. Vascular: Loss of the right ICA flow void in the neck and through the anterior genu with some reconstitution at the terminus. There is a right MCA M1 flow void. Other major intracranial vascular flow voids are preserved. Skull and upper cervical spine: Cervical spine is detailed separately today. Skull bone marrow signal is within normal limits. Sinuses/Orbits: Orbit motion artifact. No orbits soft tissue abnormality identified. Mucous retention cyst left maxillary sinus and mild or trace sinus mucosal thickening elsewhere. Other: Trace left mastoid effusion. Grossly normal visible internal auditory structures. Negative visible scalp and face. IMPRESSION: 1. Abnormal right ICA as seen  by CTA with widely scattered generally small cortical and subcortical white matter infarcts in the right MCA territory. Some involvement of the lateral right PCA/watershed area. Most confluent involvement in the right frontal operculum corresponds to the CT abnormality. 2. Small acute infarct also in the right cerebellum. 3. There is both petechial hemorrhage in the posterior right MCA territory as well as a small volume of Intraventricular Hemorrhage in the 4th ventricle, which was not apparent by CT yesterday. But no other IVH or extra-axial blood is identified. No significant intracranial mass effect. 4. Generally negative for age MRI appearance of the brain elsewhere. Cervical Spine MRI is reported separately. Electronically Signed: By: Odessa Fleming M.D. On: 07/10/2020 05:41   MR CERVICAL SPINE WO CONTRAST  Result Date: 07/10/2020 CLINICAL DATA:  Robert [name] with left side numbness. 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 No tPA/thrombectomy. Neck pain. EXAM: MRI CERVICAL SPINE WITHOUT CONTRAST TECHNIQUE: Multiplanar, multisequence MR imaging of the cervical spine was performed. No intravenous contrast was administered. COMPARISON:  [facility] cervical spine CT 03/07/2020. Brain MRI today reported separately.  CTA yesterday. FINDINGS: Alignment: Mild straightening of cervical lordosis compared to the December CT. No significant spondylolisthesis. Vertebrae: Faint degenerative appearing endplate marrow edema at C5-C6. Background heterogeneous bone marrow signal, although more normal marrow signal at the skull base. Cord: Suboptimal cord detail on axial images today due to motion. Suggestion of a short segment of cervical spinal cord myelomalacia at C6-C7 on sagittal series 13, image 10, corresponding to 1 level  of degenerative spinal stenosis. Above and below that level no definite  cord signal abnormality. Posterior Fossa, vertebral arteries, paraspinal tissues: Cervicomedullary junction is within normal limits. See brain findings today reported separately. Abnormal right ICA flow void in the neck just distal to the bifurcation. Other major arterial flow voids in the neck appear preserved and this is concordant with the CTA yesterday. Otherwise negative visible neck soft tissues, lung apices. Disc levels: C2-C3: Mild circumferential disc bulge and endplate spurring. No spinal stenosis. Mild to moderate C3 foraminal stenosis. C3-C4: Disc space loss with circumferential disc bulge and endplate spurring eccentric to the left. Mild spinal stenosis. No definite cord mass effect. Moderate to severe left greater than right C4 foraminal stenosis. C4-C5: Circumferential disc bulging with mild endplate spurring. No stenosis. C5-C6: More lobulated disc osteophyte complex eccentric to the left and best seen on series 15, image 20. Spinal stenosis with mild left hemi cord mass effect. Moderate left and severe right C6 foraminal stenosis. C6-C7: Circumferential disc osteophyte complex with broad-based posterior component. Mild spinal stenosis. Mild if any cord mass effect. Severe bilateral C7 foraminal stenosis. C7-T1: Mild facet hypertrophy greater on the right. Mild left and mild to moderate right C8 foraminal stenosis. Visible upper thoracic spine degeneration but no upper thoracic spinal stenosis. IMPRESSION: 1. Multilevel cervical disc and endplate degeneration. Multilevel mild spinal stenosis with mild cord mass effect at 1 or more levels. 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 2. No other spinal cord signal abnormality identified. Moderate to severe neural foraminal stenosis also at the bilateral C4, C6, and right C8 nerve levels. 3. 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 Preliminary report of the above  discussed by telephone with Dr. Erick Blinks on 07/10/2020 at 0544 hours. Electronically Signed   By: Odessa Fleming M.D.   On: 07/10/2020 05:53    Pamella Pert, MD, PhD [facility]  Between 7 am - 7 pm I am available, please contact me via Amion (for emergencies) or Securechat (non urgent messages)  Between 7 pm - 7 am I am not available, please contact night coverage MD/APP via Amion

## 2020-07-10 NOTE — Progress Notes (Addendum)
STROKE TEAM PROGRESS NOTE   SUBJECTIVE (INTERVAL HISTORY) His cousin is at the bedside and his mom is on the phone.  Overall his condition is stable. He stated that he has left arm and hand weakness for 6 months now, he did not come to seek medical attention because he has no insurance. He denies any abrupt worsening but more progressive worsening over the last 6 months. But he did say that he has left facial numbness for 2 weeks. Per chart, he had 1 week hx of unable to write with left hand and left hand drop dwon for 2-3 days. MRI showed right MCA and watershed infarcts as well as right cerebellar punctate infarcts. CTA showed right carotid occlusion, right MCA attenuated. MRI C-spine showed C6-7 myelopathy. Will need NSG consult.   OBJECTIVE Temp:  [98.1 F (36.7 C)-98.9 F (37.2 C)] 98.6 F (37 C) (05/02 1100) Pulse Rate:  [48-85] 66 (05/02 1100) Cardiac Rhythm: Normal sinus rhythm;Bundle branch block (05/02 0928) Resp:  [15-23] 18 (05/02 1100) BP: (131-183)/(80-101) 161/101 (05/02 1100) SpO2:  [98 %-100 %] 98 % (05/02 1100)  Recent Labs  Lab 07/09/20 2000  GLUCAP 168*   Recent Labs  Lab 07/09/20 2023 07/10/20 0301  NA 138 138  K 3.3* 3.9  CL 102 105  CO2 28 24  GLUCOSE 134* 81  BUN 17 16  CREATININE 1.28* 1.17  CALCIUM 8.9 8.6*   Recent Labs  Lab 07/09/20 2023  AST 28  ALT 16  ALKPHOS 107  BILITOT 0.8  PROT 7.0  ALBUMIN 3.6   Recent Labs  Lab 07/09/20 2023 07/10/20 0301  WBC 7.8 7.0  NEUTROABS 5.6  --   HGB 13.6 12.5*  HCT 40.4 37.6*  MCV 94.4 94.5  PLT 231 214   No results for input(s): CKTOTAL, CKMB, CKMBINDEX, TROPONINI in the last 168 hours. Recent Labs    07/09/20 2023  LABPROT 13.2  INR 1.0   No results for input(s): COLORURINE, LABSPEC, PHURINE, GLUCOSEU, HGBUR, BILIRUBINUR, KETONESUR, PROTEINUR, UROBILINOGEN, NITRITE, LEUKOCYTESUR in the last 72 hours.  Invalid input(s): APPERANCEUR     Component Value Date/Time   CHOL 185 07/10/2020  0019   TRIG 120 07/10/2020 0019   HDL 43 07/10/2020 0019   CHOLHDL 4.3 07/10/2020 0019   VLDL 24 07/10/2020 0019   LDLCALC 118 (H) 07/10/2020 0019   Lab Results  Component Value Date   HGBA1C 4.5 (L) 07/10/2020   No results found for: LABOPIA, COCAINSCRNUR, LABBENZ, AMPHETMU, THCU, LABBARB  Recent Labs  Lab 07/09/20 2023  ETH <10    I have personally reviewed the radiological images below and agree with the radiology interpretations.  CT ANGIO HEAD W OR WO CONTRAST  Result Date: 07/09/2020 CLINICAL DATA:  Initial evaluation for acute stroke. EXAM: CT ANGIOGRAPHY HEAD AND NECK TECHNIQUE: Multidetector CT imaging of the head and neck was performed using the standard protocol during bolus administration of intravenous contrast. Multiplanar CT image reconstructions and MIPs were obtained to evaluate the vascular anatomy. Carotid stenosis measurements (when applicable) are obtained utilizing NASCET criteria, using the distal internal carotid diameter as the denominator. CONTRAST:  50mL OMNIPAQUE IOHEXOL 350 MG/ML SOLN COMPARISON:  Prior head CT from earlier the same day. FINDINGS: CTA NECK FINDINGS Aortic arch: Examination somewhat technically limited by timing of the contrast bolus. Visualized aortic arch normal in caliber with normal 3 vessel morphology. No hemodynamically significant stenosis seen about the origin of the great vessels. Mild eccentric soft plaque noted within the distal aspect  of the aortic arch itself. Right carotid system: Right CCA patent from its origin to the bifurcation without stenosis. There is abrupt occlusion of the right ICA just distal to the bifurcation (series 7, image 202). Right ICA remains occluded within the neck. Left carotid system: Left common carotid artery patent from its origin to the bifurcation without stenosis. Mild atheromatous irregularity about the left carotid bulb/proximal left ICA without significant stenosis. Left ICA patent distally without  stenosis, dissection or occlusion. Vertebral arteries: Both vertebral arteries arise from the subclavian arteries. No proximal subclavian artery stenosis. Vertebral arteries patent without stenosis, dissection or occlusion. Skeleton: No visible acute osseous abnormality. No discrete or worrisome osseous lesions. Other neck: No other acute soft tissue abnormality within the neck. No mass or adenopathy. Upper chest: Visualized upper chest demonstrates no acute finding. Hypodensity noted within the distal left main pulmonary artery (series 5, image 180), favored to be artifactual and/or mixing artifact. Review of the MIP images confirms the above findings CTA HEAD FINDINGS Anterior circulation: Left internal carotid artery widely patent to the terminus without stenosis. Right ICA occluded at the skull base. Distal reconstitution at the supraclinoid segment via collateral flow across the circle-of-Willis. Left A1 segment widely patent. There is a focal severe distal right A1 stenosis (series 8, image 113). Normal anterior communicating artery complex. Anterior cerebral arteries remain well perfused to their distal aspects. Mildly attenuated flow within the right MCA as compared to the left. Right M1 segment remains perfused without stenosis. Normal right MCA bifurcation. Distal right MCA branches well perfused without visible downstream occlusion. Left M1 segment widely patent. Normal left MCA bifurcation. Distal left MCA branches well perfused. Posterior circulation: Both V4 segments patent to the vertebrobasilar junction without stenosis. Neither PICA origin well visualized. Focal hypodensity/irregularity present at the level of the vertebrobasilar junction (series 7, image 138). There is a suspected short-segment fenestration at this level, with possible underlying severe stenosis and/or artifact. A small amount of intraluminal thrombus somewhat difficult to exclude. The basilar artery is diminutive but patent  distally. Superior cerebellar arteries grossly patent proximally. Both PCAs primarily supplied via the basilar. PCAs are perfused to their distal aspects without appreciable stenosis. Venous sinuses: Grossly patent allowing for timing the contrast bolus, although evaluation fairly limited. Anatomic variants: None significant. Review of the MIP images confirms the above findings IMPRESSION: 1. 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 Distal reconstitution at the supraclinoid segment via collateral flow across the circle-of-Willis. Attenuated but patent flow within the right MCA distribution, with no visible downstream occlusion. 2. Focal hypodensity/irregularity at the level of the vertebrobasilar junction, indeterminate, but favored to reflect a short-segment fenestration with artifact or possible stenosis at this level. A small amount of intraluminal thrombus difficult to exclude, but felt to be less likely. If clinical symptoms should warrant, a repeat examination could be performed for further evaluation as needed. 3. Severe distal right A1 stenosis. Critical Value/emergent results were called by telephone at the time of interpretation on 07/09/2020 at approximately 10:45 pm to provider [facility] , who verbally acknowledged these results. Electronically Signed   By: Rise MuBenjamin  McClintock M.D.   On: 07/09/2020 23:41   CT HEAD WO CONTRAST  Result Date: 07/09/2020 CLINICAL DATA:  Initial evaluation for acute mental status change, stroke suspected. Left-sided numbness. EXAM: CT HEAD WITHOUT CONTRAST TECHNIQUE: Contiguous axial images were obtained from the base of the skull through the vertex without intravenous contrast. COMPARISON:  Prior CT from 03/07/2020. FINDINGS:  Brain: Cerebral volume within normal limits for age. Subtle hypodensity seen involving the mid and posterior right frontoparietal region, concerning for acute right MCA distribution infarct. Mild patchy involvement of  the right insula. No intracranial hemorrhage. No other acute large vessel territory infarct. No mass lesion, midline shift or mass effect. No hydrocephalus or extra-axial fluid collection. Vascular: No visible hyperdense vessel. Skull: Scalp soft tissues and calvarium within normal limits. Sinuses/Orbits: Globes and orbital soft tissues demonstrate no acute finding. Question mild proptosis. Few small retention cyst noted within the left maxillary sinus. Paranasal sinuses are otherwise clear. No mastoid effusion. Other: None. IMPRESSION: 1. Subtle hypodensity involving the mid and posterior right frontoparietal region, concerning for acute right MCA distribution infarct. No intracranial hemorrhage. 2. Question mild proptosis. Correlation with physical exam recommended. Results communicated by telephone at the time of interpretation on 07/09/2020 at 9:38 pm to provider [facility] , who verbally acknowledged these results. Electronically Signed   By: Rise Mu M.D.   On: 07/09/2020 21:39   CT ANGIO NECK W OR WO CONTRAST  Result Date: 07/09/2020 CLINICAL DATA:  Initial evaluation for acute stroke. EXAM: CT ANGIOGRAPHY HEAD AND NECK TECHNIQUE: Multidetector CT imaging of the head and neck was performed using the standard protocol during bolus administration of intravenous contrast. Multiplanar CT image reconstructions and MIPs were obtained to evaluate the vascular anatomy. Carotid stenosis measurements (when applicable) are obtained utilizing NASCET criteria, using the distal internal carotid diameter as the denominator. CONTRAST:  50mL OMNIPAQUE IOHEXOL 350 MG/ML SOLN COMPARISON:  Prior head CT from earlier the same day. FINDINGS: CTA NECK FINDINGS Aortic arch: Examination somewhat technically limited by timing of the contrast bolus. Visualized aortic arch normal in caliber with normal 3 vessel morphology. No hemodynamically significant stenosis seen about the origin of the great vessels. Mild eccentric  soft plaque noted within the distal aspect of the aortic arch itself. Right carotid system: Right CCA patent from its origin to the bifurcation without stenosis. There is abrupt occlusion of the right ICA just distal to the bifurcation (series 7, image 202). Right ICA remains occluded within the neck. Left carotid system: Left common carotid artery patent from its origin to the bifurcation without stenosis. Mild atheromatous irregularity about the left carotid bulb/proximal left ICA without significant stenosis. Left ICA patent distally without stenosis, dissection or occlusion. Vertebral arteries: Both vertebral arteries arise from the subclavian arteries. No proximal subclavian artery stenosis. Vertebral arteries patent without stenosis, dissection or occlusion. Skeleton: No visible acute osseous abnormality. No discrete or worrisome osseous lesions. Other neck: No other acute soft tissue abnormality within the neck. No mass or adenopathy. Upper chest: Visualized upper chest demonstrates no acute finding. Hypodensity noted within the distal left main pulmonary artery (series 5, image 180), favored to be artifactual and/or mixing artifact. Review of the MIP images confirms the above findings CTA HEAD FINDINGS Anterior circulation: Left internal carotid artery widely patent to the terminus without stenosis. Right ICA occluded at the skull base. Distal reconstitution at the supraclinoid segment via collateral flow across the circle-of-Willis. Left A1 segment widely patent. There is a focal severe distal right A1 stenosis (series 8, image 113). Normal anterior communicating artery complex. Anterior cerebral arteries remain well perfused to their distal aspects. Mildly attenuated flow within the right MCA as compared to the left. Right M1 segment remains perfused without stenosis. Normal right MCA bifurcation. Distal right MCA branches well perfused without visible downstream occlusion. Left M1 segment widely patent.  Normal left MCA  bifurcation. Distal left MCA branches well perfused. Posterior circulation: Both V4 segments patent to the vertebrobasilar junction without stenosis. Neither PICA origin well visualized. Focal hypodensity/irregularity present at the level of the vertebrobasilar junction (series 7, image 138). There is a suspected short-segment fenestration at this level, with possible underlying severe stenosis and/or artifact. A small amount of intraluminal thrombus somewhat difficult to exclude. The basilar artery is diminutive but patent distally. Superior cerebellar arteries grossly patent proximally. Both PCAs primarily supplied via the basilar. PCAs are perfused to their distal aspects without appreciable stenosis. Venous sinuses: Grossly patent allowing for timing the contrast bolus, although evaluation fairly limited. Anatomic variants: None significant. Review of the MIP images confirms the above findings IMPRESSION: 1. 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 Distal reconstitution at the supraclinoid segment via collateral flow across the circle-of-Willis. Attenuated but patent flow within the right MCA distribution, with no visible downstream occlusion. 2. Focal hypodensity/irregularity at the level of the vertebrobasilar junction, indeterminate, but favored to reflect a short-segment fenestration with artifact or possible stenosis at this level. A small amount of intraluminal thrombus difficult to exclude, but felt to be less likely. If clinical symptoms should warrant, a repeat examination could be performed for further evaluation as needed. 3. Severe distal right A1 stenosis. Critical Value/emergent results were called by telephone at the time of interpretation on 07/09/2020 at approximately 10:45 pm to provider [facility] , who verbally acknowledged these results. Electronically Signed   By: Rise Mu M.D.   On: 07/09/2020 23:41   MR BRAIN WO  CONTRAST  Addendum Date: 07/10/2020   ADDENDUM REPORT: 07/10/2020 05:54 ADDENDUM: This study was discussed by telephone with Dr. Erick Blinks on 07/10/2020 at 0544 hours. Electronically Signed   By: Odessa Fleming M.D.   On: 07/10/2020 05:54   Result Date: 07/10/2020 CLINICAL DATA:  55 year old male with left side numbness. 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 No tPA/thrombectomy. EXAM: MRI HEAD WITHOUT CONTRAST TECHNIQUE: Multiplanar, multiecho pulse sequences of the brain and surrounding structures were obtained without intravenous contrast. COMPARISON:  CT head, CTA head and neck yesterday. FINDINGS: Brain: Widespread patchy and scattered cortical and subcortical white matter restricted diffusion in the right MCA territory mostly affecting the right middle frontal gyrus and the mid right parietal lobe. Confluent involvement at the right frontal operculum corresponding to the CT finding on series 5, image 87. The deep white matter and deep gray nuclei are relatively spared. There are scattered small infarcts also in the right occipital lobe laterally. Cytotoxic edema. Petechial hemorrhage on series 14, image 43. Additionally there are several small linear foci of restricted diffusion in the right cerebellum (series 5, image 65). No other posterior fossa restricted diffusion. No left cerebral hemisphere restricted diffusion. Furthermore, there is a small volume of intraventricular hemorrhage in the 4th ventricle, not apparent on the CT yesterday. Chronic microhemorrhage in the left parietal lobe. No other IVH. No midline shift, mass effect, evidence of mass lesion, ventriculomegaly, or other hemorrhage. Cervicomedullary junction and pituitary are within normal limits. No significant signal abnormality in the brain parenchyma beyond the above findings. Vascular: Loss of the right ICA flow void in the neck and through the  anterior genu with some reconstitution at the terminus. There is a right MCA M1 flow void. Other major intracranial vascular flow voids are preserved. Skull and upper cervical spine: Cervical spine is detailed separately  today. Skull bone marrow signal is within normal limits. Sinuses/Orbits: Orbit motion artifact. No orbits soft tissue abnormality identified. Mucous retention cyst left maxillary sinus and mild or trace sinus mucosal thickening elsewhere. Other: Trace left mastoid effusion. Grossly normal visible internal auditory structures. Negative visible scalp and face. IMPRESSION: 1. Abnormal right ICA as seen by CTA with widely scattered generally small cortical and subcortical white matter infarcts in the right MCA territory. Some involvement of the lateral right PCA/watershed area. Most confluent involvement in the right frontal operculum corresponds to the CT abnormality. 2. Small acute infarct also in the right cerebellum. 3. There is both petechial hemorrhage in the posterior right MCA territory as well as a small volume of Intraventricular Hemorrhage in the 4th ventricle, which was not apparent by CT yesterday. But no other IVH or extra-axial blood is identified. No significant intracranial mass effect. 4. Generally negative for age MRI appearance of the brain elsewhere. Cervical Spine MRI is reported separately. Electronically Signed: By: Odessa Fleming M.D. On: 07/10/2020 05:41   MR CERVICAL SPINE WO CONTRAST  Result Date: 07/10/2020 CLINICAL DATA:  55 year old male with left side numbness. 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 No tPA/thrombectomy. Neck pain. EXAM: MRI CERVICAL SPINE WITHOUT CONTRAST TECHNIQUE: Multiplanar, multisequence MR imaging of the cervical spine was performed. No intravenous contrast was administered. COMPARISON:  [facility] cervical spine CT  03/07/2020. Brain MRI today reported separately.  CTA yesterday. FINDINGS: Alignment: Mild straightening of cervical lordosis compared to the December CT. No significant spondylolisthesis. Vertebrae: Faint degenerative appearing endplate marrow edema at C5-C6. Background heterogeneous bone marrow signal, although more normal marrow signal at the skull base. Cord: Suboptimal cord detail on axial images today due to motion. 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 Above and below that level no definite cord signal abnormality. Posterior Fossa, vertebral arteries, paraspinal tissues: Cervicomedullary junction is within normal limits. See brain findings today reported separately. Abnormal right ICA flow void in the neck just distal to the bifurcation. Other major arterial flow voids in the neck appear preserved and this is concordant with the CTA yesterday. Otherwise negative visible neck soft tissues, lung apices. Disc levels: C2-C3: Mild circumferential disc bulge and endplate spurring. No spinal stenosis. Mild to moderate C3 foraminal stenosis. C3-C4: Disc space loss with circumferential disc bulge and endplate spurring eccentric to the left. Mild spinal stenosis. No definite cord mass effect. Moderate to severe left greater than right C4 foraminal stenosis. C4-C5: Circumferential disc bulging with mild endplate spurring. No stenosis. C5-C6: More lobulated disc osteophyte complex eccentric to the left and best seen on series 15, image 20. Spinal stenosis with mild left hemi cord mass effect. Moderate left and severe right C6 foraminal stenosis. C6-C7: Circumferential disc osteophyte complex with broad-based posterior component. Mild spinal stenosis. Mild if any cord mass effect. Severe bilateral C7 foraminal stenosis. C7-T1: Mild facet hypertrophy greater on the right. Mild left and mild to  moderate right C8 foraminal stenosis. Visible upper thoracic spine degeneration but no upper thoracic spinal stenosis. IMPRESSION: 1. Multilevel cervical disc and endplate degeneration. Multilevel mild spinal stenosis with mild cord mass effect at 1 or more levels. 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 2. No other spinal  cord signal abnormality identified. Moderate to severe neural foraminal stenosis also at the bilateral C4, C6, and right C8 nerve levels. 3. 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 Preliminary report of the above discussed by telephone with Dr. Erick Blinks on 07/10/2020 at 0544 hours. Electronically Signed   By: Odessa Fleming M.D.   On: 07/10/2020 05:53    PHYSICAL EXAM  Temp:  [98.1 F (36.7 C)-98.9 F (37.2 C)] 98.6 F (37 C) (05/02 1100) Pulse Rate:  [48-85] 66 (05/02 1100) Resp:  [15-23] 18 (05/02 1100) BP: (131-183)/(80-101) 161/101 (05/02 1100) SpO2:  [98 %-100 %] 98 % (05/02 1100)  General - Well nourished, well developed, in no apparent distress.  Ophthalmologic - fundi not visualized due to noncooperation.  Cardiovascular - Regular rhythm and rate.  Mental Status -  Level of arousal and orientation to time, place, and person were intact. Language including expression, naming, repetition, comprehension was assessed and found intact. Fund of Knowledge was assessed and was intact.  Cranial Nerves II - XII - II - Visual field intact OU. III, IV, VI - Extraocular movements intact. V - Facial sensation decreased on the left VII - left nasolabial fold flattening but equal movement b/l. VIII - Hearing & vestibular intact bilaterally. X - Palate elevates symmetrically. XI - Chin turning & shoulder shrug intact bilaterally. XII - Tongue protrusion intact.  Motor Strength - The patient's strength was normal in RUE and BLEs, however left UE proximal and bicep and tricep and wrist  extension 3/5, finger grip 1/5.  Bulk was normal and fasciculations were absent.    Motor Tone - Muscle tone was assessed at the neck and appendages and was increased left UE.  Reflexes - The patient's reflexes were 1+ in all extremities and he had no pathological reflexes, no hoffman's bilaterally.  Sensory - Light touch, temperature/pinprick were assessed and were decreased on the left UE.    Coordination - The patient had normal movements in the right hand with no ataxia or dysmetria.  Tremor was absent.  Gait and Station - deferred.   ASSESSMENT/PLAN Robert [name] is a 55 y.o. male with history of hypertension, hyperlipidemia, smoker admitted for left arm hand weakness for 6 months, worsening for [redacted] weeks along with left facial numbness. No tPA given due to outside window.    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  However, cardioembolic source is in DDx  CT head right MCA frontoparietal infarct  CTA head and neck right ICA occlusion, right MCA attenuated flow, right A1 high-grade stenosis.  MRI right MCA and watershed area scattered infarcts.  Right cerebellum punctate infarct.  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 fib  LDL 118  HgbA1c 4.5  Hypercoagulable work-up pending  UDS pending  lovenox for VTE prophylaxis  No antithrombotic prior to admission, now on No antithrombotic given IVH at this time.   Ongoing aggressive stroke risk factor management  Therapy recommendations:  pending  Disposition:  pending  IVH, incidental finding  MRI showed a small volume of Intraventricular Hemorrhage in the 4th ventricle  Etiology unclear  Hold off antiplatelet for now  Will repeat CT head in 2 days.   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 Stable on the high end . Resume home meds - norvasc 10 and HCTZ 12.5 and lisinopril 20 . BP goal < 160 given IVH  Long term BP goal normotensive   Hyperlipidemia  Home meds:  none   LDL 118, goal < 70  Now on lipitor 40  Continue statin at discharge  Tobacco abuse  Current smoker  1PPD for 40 years now  Smoking cessation counseling provided  Pt is willing to quit  Other Stroke Risk Factors    Other Active Problems    Hospital day # 0  I had long discussion with mom on the phone, updated pt current condition, treatment plan and potential prognosis, and answered all the questions. She expressed understanding and appreciation. I discussed with Dr. Elvera Lennox. I spent  35 minutes in total face-to-face time with the patient, more than 50% of which was spent in counseling and coordination of care, reviewing test results, images and medication, and discussing the diagnosis, treatment plan and potential prognosis. This patient's care requiresreview of multiple databases, neurological assessment, discussion with family, other specialists and medical decision making of high complexity.  Marvel Plan, MD PhD Stroke Neurology 07/10/2020 12:01 PM    To contact Stroke Continuity provider, please refer to [email or web address]. After hours, contact General Neurology

## 2020-07-10 NOTE — Progress Notes (Incomplete)
  Echocardiogram 2D Echocardiogram has been performed.  Shirlean Kelly 07/10/2020, 9:09 AM

## 2020-07-10 NOTE — Progress Notes (Signed)
OT Cancellation Note  Patient Details Name: Robert [name] MRN: [id number] DOB: 1965-08-02   Cancelled Treatment:    Reason Eval/Treat Not Completed: Patient not medically ready;Active bedrest order. Pt on active bedrest until follow up CT scan per MD.  Will follow and see as able when appropriate.   Barry Brunner, OT Acute Rehabilitation Services Pager [phone] Office [phone]   Chancy Milroy 07/10/2020, 8:13 AM

## 2020-07-10 NOTE — Consult Note (Signed)
   Providing Compassionate, Quality Care - Together  Neurosurgery Consult  Referring physician: Dr. Roda Shutters Reason for referral: Cervical stenosis  Chief Complaint: Left upper extremity weakness  History of Present Illness: This is a 55 year old male with a history of hypertension that complains of left arm and hand weakness for approximately 6 months.  He has no insurance and therefore did not seek care.  He states the worsening has been ongoing for over 6 months but he had left facial numbness for approximately 2 weeks.  He also complains of left facial droop.  He was admitted for stroke work-up and found to have a right MCA/PCA watershed infarct as well as a right cerebellar infarct.  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  MRI cervical spine also showed cervical stenosis.  History reviewed. No pertinent surgical history.  Medications: I have reviewed the patient's current medications. Allergies: No Known Allergies  History reviewed. No pertinent family history. Social History:  has no history on file for tobacco use, alcohol use, and drug use.  ROS: All pertinent positives and negatives are listed in HPI above  Physical Exam:  Vital signs in last 24 hours: Temp:  [98 F (36.7 C)-98.3 F (36.8 C)] 98 F (36.7 C) (07/25 1814) Pulse Rate:  [58-128] 65 (07/26 0746) Resp:  [11-18] 14 (07/26 0217) BP: (138-182)/(65-125) 153/88 (07/26 0700) SpO2:  [91 %-98 %] 96 % (07/26 0746) PE: Awake alert, oriented x3 No acute distress PERRLA Cranial nerves II through XII intact except for left facial droop Right upper extremity 5/5 strength Left upper extremity deltoid/bicep 4/5, wrist/tricep 3/5, grip 2/5, left hand contracted Bilateral lower extremity full strength No Hoffmann's bilaterally Deep tendon reflexes in patella/Achilles normal bilaterally   Impression/Assessment:  55 year old male with  1.  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  Acute MCA/PCA, cerebellar CVA  Plan:  -Recommend PT/OT eval -Neurology  stroke management -No acute neurosurgical intervention for cervical stenosis given the acute stroke.  -We will follow the patient in the outpatient setting. -will sign off at this time  Thank you for allowing me to participate in this patient's care.  Please do not hesitate to call with questions or concerns.   Monia Pouch, DO Neurosurgeon [facility] Cell: [phone]

## 2020-07-10 NOTE — Progress Notes (Signed)
HOSPITAL MEDICINE OVERNIGHT EVENT NOTE    Nursing reports the patient has exhibited increasing agitation and frustration throughout the evening, stating that he does not want his telemetry nor does he want any intravenous fluids or his IV.  Patient unfortunately has pulled off his telemetry leads and pulled out his IV.  Nursing is doing their best to provide conservative measures to calming the patient down.  In the meantime, we will temporarily discontinue intravenous fluids and telemetry for now.    Marinda Elk  MD [facility]

## 2020-07-11 LAB — BETA-2-GLYCOPROTEIN I ABS, IGG/M/A
Beta-2 Glyco I IgG: <9 GPI IgG units (ref 0–20)
Beta-2-Glycoprotein I IgA: <9 GPI IgA units (ref 0–25)
Beta-2-Glycoprotein I IgM: <9 GPI IgM units (ref 0–32)

## 2020-07-11 LAB — HOMOCYSTEINE: Homocysteine: 15.9 umol/L — ABNORMAL HIGH (ref 0.0–14.5)

## 2020-07-11 LAB — LUPUS ANTICOAGULANT PANEL
DRVVT: 40 s (ref 0.0–47.0)
PTT Lupus Anticoagulant: 38.9 s (ref 0.0–51.9)

## 2020-07-11 LAB — GLUCOSE, CAPILLARY: Glucose-Capillary: 107 mg/dL — ABNORMAL HIGH (ref 70–99)

## 2020-07-11 LAB — CARDIOLIPIN ANTIBODIES, IGG, IGM, IGA
Anticardiolipin IgA: <9 APL U/mL (ref 0–11)
Anticardiolipin IgG: 13 GPL U/mL (ref 0–14)
Anticardiolipin IgM: 9 MPL U/mL (ref 0–12)

## 2020-07-11 LAB — ANTINUCLEAR ANTIBODIES, IFA: ANA Ab, IFA: NEGATIVE

## 2020-07-11 NOTE — Evaluation (Signed)
Physical Therapy Evaluation Patient Details Name: Robert [name] MRN: [id number] DOB: 02-24-1966 Today's Date: 07/11/2020   History of Present Illness  Pt is a 55 y/o male with who presented to ED for evaluation of intermittent L hand weakness on 07/09/20. Reports L hand weakness, facial numbness and decreased vision. CT reveals acute R MCA infarct. Symptoms present for several months but worsened over past 5-6 days (pt delayed care due to no insurance). PMH of HTN and tobacco use  Clinical Impression   Pt admitted with CVA.  Pt with largest deficits in L hand.  But also demonstrates deficits with gait speed, safety, and balance.  Pt with decreased cognition that limits ability to return home alone and reports he could potentially stay with his father during the day. Pt currently with functional limitations due to the deficits listed below (see PT Problem List). Pt will benefit from skilled PT to increase their independence and safety with mobility to allow discharge to the venue listed below.       Follow Up Recommendations Outpatient PT;Supervision/Assistance - 24 hour    Equipment Recommendations  None recommended by PT    Recommendations for Other Services       Precautions / Restrictions Precautions Precautions: Fall      Mobility  Bed Mobility Overal bed mobility: Needs Assistance Bed Mobility: Supine to Sit     Supine to sit: Supervision     General bed mobility comments: no assist required    Transfers Overall transfer level: Needs assistance Equipment used: None Transfers: Sit to/from Stand Sit to Stand: Min guard         General transfer comment: for safety  Ambulation/Gait Ambulation/Gait assistance: Min guard;Supervision Gait Distance (Feet): 250 Feet Assistive device: None Gait Pattern/deviations: Step-through pattern;Drifts right/left;Scissoring Gait velocity: decreased gait speed Gait velocity interpretation: 1.31 - 2.62 ft/sec, indicative of limited  community ambulator General Gait Details: Pt with mild drifting L and R and occasional scissor gait (only 4-5 x during 250').  He had no overt LOB and progressed to supervision.  Stairs Stairs: Yes Stairs assistance: Min guard Stair Management: One rail Right;Alternating pattern;Forwards Number of Stairs: 4 General stair comments: slow but steady  Wheelchair Mobility    Modified Rankin (Stroke Patients Only) Modified Rankin (Stroke Patients Only) Pre-Morbid Rankin Score: No symptoms Modified Rankin: Moderate disability     Balance Overall balance assessment: Needs assistance   Sitting balance-Leahy Scale: Normal     Standing balance support: No upper extremity supported Standing balance-Leahy Scale: Good                   Standardized Balance Assessment Standardized Balance Assessment : Dynamic Gait Index   Dynamic Gait Index Level Surface: Mild Impairment Change in Gait Speed: Mild Impairment Gait with Horizontal Head Turns: Mild Impairment Gait with Vertical Head Turns: Mild Impairment Gait and Pivot Turn: Mild Impairment Step Over Obstacle: Mild Impairment Step Around Obstacles: Mild Impairment Steps: Mild Impairment Total Score: 16       Pertinent Vitals/Pain Pain Assessment: Faces Faces Pain Scale: No hurt    Home Living Family/patient expects to be discharged to:: Private residence Living Arrangements: Parent Available Help at Discharge: Family Type of Home: House Home Access: Level entry     Home Layout: One level Home Equipment: None Additional Comments: lives with his mother, but reports he can stay with his father during the day as his mom works    Prior Function Level of Independence: Independent  Comments: independent and driving, works as Public librarian: Left    Extremity/Trunk Assessment   Upper Extremity Assessment Upper Extremity Assessment: Defer to OT evaluation    Lower  Extremity Assessment Lower Extremity Assessment:  (WNL - sensation, MMT, ROM, coordination)    Cervical / Trunk Assessment Cervical / Trunk Assessment: Normal  Communication   Communication: No difficulties  Cognition Arousal/Alertness: Awake/alert Behavior During Therapy: Flat affect Overall Cognitive Status: Impaired/Different from baseline Area of Impairment: Memory;Following commands;Safety/judgement;Awareness;Problem solving                     Memory: Decreased short-term memory Following Commands: Follows one step commands consistently;Follows one step commands with increased time Safety/Judgement: Decreased awareness of safety;Decreased awareness of deficits Awareness: Emergent Problem Solving: Slow processing;Decreased initiation;Difficulty sequencing;Requires verbal cues;Requires tactile cues General Comments: patient labile during session, demonstrates decreased problem solving and awareness. Difficulty sequencing numbers/months backwards. Reports prior to admission had 2 episodes of driving on the wrong side of the road      General Comments  Vision: Reports intermittent episodes of blurriness or field cut on L side.  Tested nearly equal to R during session.     Exercises     Assessment/Plan    PT Assessment Patient needs continued PT services  PT Problem List Decreased mobility;Decreased safety awareness;Decreased balance       PT Treatment Interventions DME instruction;Therapeutic activities;Gait training;Therapeutic exercise;Patient/family education;Stair training;Balance training;Functional mobility training    PT Goals (Current goals can be found in the Care Plan section)  Acute Rehab PT Goals Patient Stated Goal: to get better, use my L hand PT Goal Formulation: With patient Time For Goal Achievement: 07/25/20 Potential to Achieve Goals: Good Additional Goals Additional Goal #1: Will increase DGI score to >19 to indicate low fall risk     Frequency Min 4X/week   Barriers to discharge   cognition    Co-evaluation PT/OT/SLP Co-Evaluation/Treatment: Yes Reason for Co-Treatment: For patient/therapist safety PT goals addressed during session: Mobility/safety with mobility         AM-PAC PT "6 Clicks" Mobility  Outcome Measure Help needed turning from your back to your side while in a flat bed without using bedrails?: None Help needed moving from lying on your back to sitting on the side of a flat bed without using bedrails?: None Help needed moving to and from a bed to a chair (including a wheelchair)?: A Little Help needed standing up from a chair using your arms (e.g., wheelchair or bedside chair)?: A Little Help needed to walk in hospital room?: A Little Help needed climbing 3-5 steps with a railing? : A Little 6 Click Score: 20    End of Session Equipment Utilized During Treatment: Gait belt Activity Tolerance: Patient tolerated treatment well Patient left: in chair;with call bell/phone within reach;with chair alarm set Nurse Communication: Mobility status PT Visit Diagnosis: Unsteadiness on feet (R26.81)    Time: 9485-4627 PT Time Calculation (min) (ACUTE ONLY): 30 min   Charges:   PT Evaluation $PT Eval Moderate Complexity: 1 Mod          Chardai Gangemi, PT Acute Rehab Services Pager [phone] [facility] [phone]    Rayetta Humphrey 07/11/2020, 2:17 PM

## 2020-07-11 NOTE — Plan of Care (Signed)
A&OX4 with intermittent agitation. Neuro status intact. VSS. Telemetry found disconnected. Pt educated on importance of being connected to telemetry monitor. Pt agreeable to wearing telemetry monitor and acknowledged understanding. Gown changed, tele re-established and box placed inside pocket. Pt increasingly became agitated and frustrated with being connected to lines/tubes. Reported his arm needed a break and self disconnected from IVF and pulled off tele leads. Pt refused nursing interventions, unable to console/redirect. MD notified and made aware. New orders to DC fluids/telemetry placed, pt satisfied with this and has remained calm and cooperative. Pt currently resting in bed comfortably. Urine collected and sent to lab. Will continue to monitor and follow plan of care.   Problem: Education: Goal: Knowledge of General Education information will improve Description: Including pain rating scale, medication(s)/side effects and non-pharmacologic comfort measures Outcome: Progressing   Problem: Clinical Measurements: Goal: Ability to maintain clinical measurements within normal limits will improve Outcome: Progressing Goal: Will remain free from infection Outcome: Progressing Goal: Diagnostic test results will improve Outcome: Progressing Goal: Respiratory complications will improve Outcome: Progressing Goal: Cardiovascular complication will be avoided Outcome: Progressing   Problem: Activity: Goal: Risk for activity intolerance will decrease Outcome: Progressing   Problem: Nutrition: Goal: Adequate nutrition will be maintained Outcome: Progressing   Problem: Elimination: Goal: Will not experience complications related to bowel motility Outcome: Progressing Goal: Will not experience complications related to urinary retention Outcome: Progressing   Problem: Pain Managment: Goal: General experience of comfort will improve Outcome: Progressing   Problem: Safety: Goal: Ability to  remain free from injury will improve Outcome: Progressing   Problem: Skin Integrity: Goal: Risk for impaired skin integrity will decrease Outcome: Progressing   Problem: Education: Goal: Knowledge of disease or condition will improve Outcome: Progressing Goal: Knowledge of secondary prevention will improve Outcome: Progressing Goal: Knowledge of patient specific risk factors addressed and post discharge goals established will improve Outcome: Progressing Goal: Individualized Educational Video(s) Outcome: Progressing   Problem: Coping: Goal: Will identify appropriate support needs Outcome: Progressing   Problem: Health Behavior/Discharge Planning: Goal: Ability to manage health-related needs will improve Outcome: Progressing   Problem: Self-Care: Goal: Ability to participate in self-care as condition permits will improve Outcome: Progressing Goal: Verbalization of feelings and concerns over difficulty with self-care will improve Outcome: Progressing Goal: Ability to communicate needs accurately will improve Outcome: Progressing   Problem: Nutrition: Goal: Risk of aspiration will decrease Outcome: Progressing Goal: Dietary intake will improve Outcome: Progressing   Problem: Ischemic Stroke/TIA Tissue Perfusion: Goal: Complications of ischemic stroke/TIA will be minimized Outcome: Progressing

## 2020-07-11 NOTE — Evaluation (Addendum)
Occupational Therapy Evaluation Patient Details Name: Robert [name] MRN: [id number] DOB: [birth year] Today's Date: 07/11/2020    History of Present Illness Pt is a 55 y/o male with PMH of HTN and tobacco use who presents to ED for evaluation of intermittent L hand weakness. Reports L hand weakness, facial numbness and decreased vision. CT reveals acute R MCA infarct.   Clinical Impression   PTA patient independent.  Admitted for above and limited by problem list below, including L UE weakness, impaired coordination, L neglect and impaired cognition.  Patient demonstrates difficulty with problem solving, awareness and safety; he is able to follow commands, requires cueing to attend to L side and demonstrate difficulty sequencing.  He requires min guard for mobility in room and for transfers, up to min assist for ADLs with decreased functional use of dominant L hand. Educated on positioning, stretches and exercises to L UE, pt will benefit from resting hand splint and placed order with MD verbal order. Patient reports his mother works, but he could stay with his dad during the day; reports will have transportation to outpatient OT at dc.  Will follow acutely.     Follow Up Recommendations  Outpatient OT;Supervision/Assistance - 24 hour    Equipment Recommendations  3 in 1 bedside commode    Recommendations for Other Services       Precautions / Restrictions Precautions Precautions: Fall Restrictions Weight Bearing Restrictions: No      Mobility Bed Mobility Overal bed mobility: Needs Assistance Bed Mobility: Supine to Sit     Supine to sit: Supervision     General bed mobility comments: no assist required    Transfers Overall transfer level: Needs assistance   Transfers: Sit to/from Stand Sit to Stand: Min guard         General transfer comment: for safety    Balance Overall balance assessment: Mild deficits observed, not formally tested                                          ADL either performed or assessed with clinical judgement   ADL Overall ADL's : Needs assistance/impaired     Grooming: Min guard;Standing   Upper Body Bathing: Minimal assistance;Sitting   Lower Body Bathing: Min guard;Sit to/from stand   Upper Body Dressing : Minimal assistance;Sitting   Lower Body Dressing: Min guard;Sit to/from stand Lower Body Dressing Details (indicate cue type and reason): able to manage socks with R UE only (attempting to use L UE but unable), min guard sit to stand for safety Toilet Transfer: Min guard;Ambulation           Functional mobility during ADLs: Min guard General ADL Comments: pt limited by cognition, L UE weaknes, and L neglect     Vision Baseline Vision/History: No visual deficits Patient Visual Report: Other (comment) (reports L visual loss initally, but improved today) Vision Assessment?: Yes Eye Alignment: Within Functional Limits Ocular Range of Motion: Within Functional Limits Alignment/Gaze Preference: Within Defined Limits Tracking/Visual Pursuits: Able to track stimulus in all quads without difficulty Visual Fields: No apparent deficits Additional Comments: L neglect noted     Perception Perception Comments: L neglect   Praxis      Pertinent Vitals/Pain Pain Assessment: Faces Faces Pain Scale: No hurt     Hand Dominance Left   Extremity/Trunk Assessment Upper Extremity Assessment Upper Extremity Assessment: LUE deficits/detail LUE Deficits /  Details: grossly 3-/5 MMT at shoulder, elbow, wrist; limited grasp and hand extension; increased tone in hand only LUE Sensation: WNL LUE Coordination: decreased fine motor;decreased gross motor   Lower Extremity Assessment Lower Extremity Assessment: Defer to PT evaluation   Cervical / Trunk Assessment Cervical / Trunk Assessment: Normal   Communication Communication Communication: No difficulties   Cognition Arousal/Alertness:  Awake/alert Behavior During Therapy: Flat affect Overall Cognitive Status: Impaired/Different from baseline Area of Impairment: Memory;Following commands;Safety/judgement;Awareness;Problem solving                     Memory: Decreased short-term memory Following Commands: Follows one step commands consistently;Follows one step commands with increased time Safety/Judgement: Decreased awareness of safety;Decreased awareness of deficits Awareness: Emergent Problem Solving: Slow processing;Decreased initiation;Difficulty sequencing;Requires verbal cues;Requires tactile cues General Comments: patient labile during session, demonstrates decreased problem solving and awareness. Difficulty sequencing numbers/months backwards.   General Comments  discussed positioning of L UE, stretches to hand/fingers and functional use of UE    Exercises     Shoulder Instructions      Home Living Family/patient expects to be discharged to:: Private residence Living Arrangements: Parent Available Help at Discharge: Family Type of Home: House Home Access: Level entry     Home Layout: One level     Bathroom Shower/Tub: Producer, television/film/video: Standard     Home Equipment: None   Additional Comments: lives with his mother, but reports he can stay with his father during the day as his mom works      Prior Functioning/Environment Level of Independence: Independent        Comments: independent and driving, works as Product/process development scientist Problem List: Decreased strength;Decreased range of motion;Decreased activity tolerance;Impaired vision/perception;Decreased coordination;Decreased cognition;Decreased safety awareness;Decreased knowledge of use of DME or AE;Decreased knowledge of precautions;Impaired UE functional use      OT Treatment/Interventions: Self-care/ADL training;Energy conservation;DME and/or AE instruction;Therapeutic activities;Cognitive  remediation/compensation;Visual/perceptual remediation/compensation;Patient/family education;Balance training;Neuromuscular education;Splinting    OT Goals(Current goals can be found in the care plan section) Acute Rehab OT Goals Patient Stated Goal: to get better, use my L hand OT Goal Formulation: With patient Time For Goal Achievement: 07/25/20 Potential to Achieve Goals: Good  OT Frequency: Min 2X/week   Barriers to D/C:            Co-evaluation PT/OT/SLP Co-Evaluation/Treatment: Yes Reason for Co-Treatment: For patient/therapist safety;To address functional/ADL transfers   OT goals addressed during session: ADL's and self-care      AM-PAC OT "6 Clicks" Daily Activity     Outcome Measure Help from another person eating meals?: A Little Help from another person taking care of personal grooming?: A Little Help from another person toileting, which includes using toliet, bedpan, or urinal?: A Little Help from another person bathing (including washing, rinsing, drying)?: A Little Help from another person to put on and taking off regular upper body clothing?: A Little Help from another person to put on and taking off regular lower body clothing?: A Little 6 Click Score: 18   End of Session Equipment Utilized During Treatment: Gait belt Nurse Communication: Mobility status  Activity Tolerance: Patient tolerated treatment well Patient left: in chair;with call bell/phone within reach;with chair alarm set  OT Visit Diagnosis: Other abnormalities of gait and mobility (R26.89);Muscle weakness (generalized) (M62.81);Hemiplegia and hemiparesis Hemiplegia - Right/Left: Left Hemiplegia - dominant/non-dominant: Dominant Hemiplegia - caused by: Cerebral infarction  Time: 1657-9038 OT Time Calculation (min): 29 min Charges:  OT General Charges $OT Visit: 1 Visit OT Evaluation $OT Eval Moderate Complexity: 1 Mod  Barry Brunner, OT Acute Rehabilitation Services Pager  [phone] Office [phone]   Chancy Milroy 07/11/2020, 1:08 PM

## 2020-07-11 NOTE — Progress Notes (Signed)
PROGRESS NOTE  Robert [name] VEH:[id number] DOB: 1965-10-18 DOA: 07/09/2020 PCP: Pcp, No   LOS: 1 day   Brief Narrative / Interim history: 55 year old male with HTN, tobacco use, comes into the hospital with left hand weakness.  This has been intermittent over several months.  He also tells me that occasionally he is noticing numbness in his left side, slurred speech and facial droop.  This comes and goes.  Symptoms have gotten significantly worse with more weakness in his left hand 5 to 6 days ago.  He is left-handed and realized that he is unable to write any more and came to the hospital.  Subjective / 24h Interval events: Persistent left arm weakness.  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  There are also small infarct in the right cerebellum.  CT angiogram showed abrupt occlusion of right ICA just distal to the bifurcation.  She was initially placed on dual antiplatelet therapy with aspirin and Plavix however on the MRI it was also evident some additional petechial hemorrhage in the posterior right MCA territory and small volume intraventricular hemorrhage in the fourth ventricle which was not not apparent in the original CT.  His dual antiplatelet therapy is now on hold.   -Neurology recommends repeat CT head on 5/4 -Neurology consulted -2D echo showed EF 55-60%, no WMA, RV normal. 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  Scheduled Meds: . amLODipine  10 mg Oral Daily  . atorvastatin  40 mg Oral Daily  . enoxaparin (LOVENOX) injection  40 mg Subcutaneous Q24H  .  lisinopril  20 mg Oral Daily   And  . hydrochlorothiazide  12.5 mg Oral Daily  . nicotine  14 mg Transdermal Daily   Continuous Infusions:  PRN Meds:.acetaminophen **OR** acetaminophen (TYLENOL) oral liquid 160 mg/5 mL **OR** acetaminophen, hydrALAZINE, senna-docusate  Diet Orders (From admission, onward)    Start     Ordered   07/10/20 0536  Diet regular Room service appropriate? Yes; Fluid consistency: Thin  Diet effective now       Question Answer Comment  Room service appropriate? Yes   Fluid consistency: Thin      07/10/20 0536          DVT prophylaxis: enoxaparin (LOVENOX) injection 40 mg Start: 07/10/20 1000     Code Status: Full Code  Family Communication: No family at bedside  Status is: Observation  The patient will require care spanning > 2 midnights and should be moved to inpatient because: Inpatient level of care appropriate due to severity of illness  Dispo: The patient is from: Home              Anticipated d/c is to: Home              Patient currently is not medically stable to d/c.   Difficult to place patient No  Level of care: Progressive  Consultants:  Neurology  Procedures:  2D echo: Pending  Microbiology  none  Antimicrobials: none    Objective: Vitals:   07/10/20 2056 07/11/20 0052 07/11/20 0451 07/11/20 0753  BP: (!) 150/93 (!) 140/92 (!) 145/95 (!) 141/86  Pulse:  62 74 76  Resp: 16 18 18 16   Temp: 98.2 F (36.8 C) 98.2 F (36.8 C)  98.2 F (36.8 C)  TempSrc: Oral Oral  Oral  SpO2: 100% 100% 100% 100%    Intake/Output Summary (Last 24 hours) at 07/11/2020 1021 Last data filed at 07/11/2020 0300 Gross per 24 hour  Intake 560 ml  Output 100 ml  Net 460 ml   There were no vitals filed for this visit.  Examination:  Constitutional: No distress Eyes: No icterus ENMT: mmm Neck: normal, supple Respiratory: Clear bilaterally, no wheezing, no crackles, normal respiratory effort Cardiovascular: Regular rate and rhythm, no  murmurs, no peripheral edema Abdomen: Soft, nontender, distended, bowel sounds positive.  Musculoskeletal: no clubbing / cyanosis.  Skin: No rashes seen Neurologic: Left upper extremity weakness, good strength otherwise.  Left facial droop, slurred speech.  No new focal deficits  Data Reviewed: I have independently reviewed following labs and imaging studies   CBC: Recent Labs  Lab 07/09/20 2023 07/10/20 0301  WBC 7.8 7.0  NEUTROABS 5.6  --   HGB 13.6 12.5*  HCT 40.4 37.6*  MCV 94.4 94.5  PLT 231 214   Basic Metabolic Panel: Recent Labs  Lab 07/09/20 2023 07/10/20 0301  NA 138 138  K 3.3* 3.9  CL 102 105  CO2 28 24  GLUCOSE 134* 81  BUN 17 16  CREATININE 1.28* 1.17  CALCIUM 8.9 8.6*   Liver Function Tests: Recent Labs  Lab 07/09/20 2023  AST 28  ALT 16  ALKPHOS 107  BILITOT 0.8  PROT 7.0  ALBUMIN 3.6   Coagulation Profile: Recent Labs  Lab 07/09/20 2023  INR 1.0   HbA1C: Recent Labs    07/10/20 0019  HGBA1C 4.5*   CBG: Recent Labs  Lab 07/09/20 2000  GLUCAP 168*    Recent Results (from the past 240 hour(s))  Resp Panel by RT-PCR (Flu A&B, Covid) Nasopharyngeal Swab     Status: None   Collection Time: 07/10/20 12:40 AM   Specimen: Nasopharyngeal Swab; Nasopharyngeal(NP) swabs in vial transport medium  Result Value Ref Range Status   SARS Coronavirus 2 by RT PCR NEGATIVE NEGATIVE Final    Comment: (NOTE) SARS-CoV-2 target nucleic acids are NOT DETECTED.  The SARS-CoV-2 RNA is generally detectable in upper respiratory specimens during the acute phase of infection. The lowest concentration of SARS-CoV-2 viral copies this assay can detect is 138 copies/mL. A negative result does not preclude SARS-Cov-2 infection and should not be used as the sole basis for treatment or other patient management decisions. A negative result may occur with  improper specimen collection/handling, submission of specimen other than nasopharyngeal swab, presence of  viral mutation(s) within the areas targeted by this assay, and inadequate number of viral copies(<138 copies/mL). A negative result must be combined with clinical observations, patient history, and epidemiological information. The expected result is Negative.  Fact Sheet for Patients:  09/09/20  Fact Sheet for Healthcare Providers:  BloggerCourse.com  This test is no t yet approved or cleared by the [place] FDA and  has been authorized for detection and/or diagnosis of SARS-CoV-2 by FDA under an Emergency Use Authorization (EUA). This EUA will remain  in effect (meaning this test can be used) for the duration of the COVID-19 declaration under Section 564(b)(1) of the Act, 21 U.S.C.section 360bbb-3(b)(1), unless the authorization is terminated  or revoked sooner.       Influenza A by PCR NEGATIVE NEGATIVE Final   Influenza B by PCR NEGATIVE NEGATIVE Final  Comment: (NOTE) The Xpert Xpress SARS-CoV-2/FLU/RSV plus assay is intended as an aid in the diagnosis of influenza from Nasopharyngeal swab specimens and should not be used as a sole basis for treatment. Nasal washings and aspirates are unacceptable for Xpert Xpress SARS-CoV-2/FLU/RSV testing.  Fact Sheet for Patients: BloggerCourse.com  Fact Sheet for Healthcare Providers: SeriousBroker.it  This test is not yet approved or cleared by the [place] FDA and has been authorized for detection and/or diagnosis of SARS-CoV-2 by FDA under an Emergency Use Authorization (EUA). This EUA will remain in effect (meaning this test can be used) for the duration of the COVID-19 declaration under Section 564(b)(1) of the Act, 21 U.S.C. section 360bbb-3(b)(1), unless the authorization is terminated or revoked.  Performed at [facility], [address] [place], [place] [address]      Radiology Studies: CT  HEAD WO CONTRAST  Result Date: 07/10/2020 CLINICAL DATA:  Arm pain. Left-sided numbness. Right MCA territory infarction. EXAM: CT HEAD WITHOUT CONTRAST TECHNIQUE: Contiguous axial images were obtained from the base of the skull through the vertex without intravenous contrast. COMPARISON:  MRI earlier same day.  CT studies yesterday. FINDINGS: Brain: Blood is visible within the fourth ventricle, as seen on previous MRI but not present on the head CT of yesterday. The etiology of this hemorrhage is not clear. Small vessel acute infarction in the right cerebellum appears the same. Scattered foci cortical low-density in the right middle cerebral artery territory, most notable in the right frontal operculum, consistent with the previously seen right MCA territory infarctions. No evidence of progression. No evidence of mass effect or hemorrhage within the right MCA territory infarctions. No hydrocephalus or extra-axial collection. Vascular: No abnormal vascular finding. Skull: Normal Sinuses/Orbits: No sinus inflammatory disease.  Orbits negative. Other: None IMPRESSION: Areas of acute infarction in the right MCA territory, most notable in the frontal operculum, appear stable the yesterday's CT. Small right cerebellar acute infarction. Hemorrhage in the fourth ventricle appears similar to the MRI of earlier today, but was not present on the CT of yesterday. The etiology of this hemorrhage is unclear. Electronically Signed   By: Paulina Fusi M.D.   On: 07/10/2020 12:49    Pamella Pert, MD, PhD [facility]  Between 7 am - 7 pm I am available, please contact me via Amion (for emergencies) or Securechat (non urgent messages)  Between 7 pm - 7 am I am not available, please contact night coverage MD/APP via Amion

## 2020-07-11 NOTE — Progress Notes (Signed)
Orthopedic Tech Progress Note Patient Details:  Robert [name] 10/11/1965 [id number] Called in order to [employer] for a RESTING HAND SPLINT Patient ID: Robert [name], male   DOB: 28-Oct-1965, 55 y.o.   MRN: [id number]   Robert [name] 07/11/2020, 2:02 PM

## 2020-07-11 NOTE — TOC Initial Note (Signed)
Transition of Care [facility]) - Initial/Assessment Note    Patient Details  Name: Robert [name] MRN: [id number] Date of Birth: 01/21/1966  Transition of Care [facility]) CM/SW Contact:    Kermit Balo, RN Phone Number: 07/11/2020, 3:12 PM  Clinical Narrative:                 Patient lives at home with his mother. Per pt she is able to provide some assistance and supervision.  Outpatient therapy recommended. Pt interested in [facility]. Orders in Epic and information on the AVS.  Pt denies issues with transportation. Pt is without insurance and no PcP. He was interested in having an appt scheduled at one of the [facility]. Cm has obtained appt and placed information on the AVS. Pt will most likely need medication assistance at d/c.  TOC following.    Expected Discharge Plan: OP Rehab Barriers to Discharge: Inadequate or no insurance,Continued Medical Work up   Patient Goals and CMS Choice     Choice offered to / list presented to : Patient  Expected Discharge Plan and Services Expected Discharge Plan: OP Rehab   Discharge Planning Services: CM Consult   Living arrangements for the past 2 months: Single Family Home                                      Prior Living Arrangements/Services Living arrangements for the past 2 months: Single Family Home Lives with:: Parents Patient language and need for interpreter reviewed:: Yes Do you feel safe going back to the place where you live?: Yes      Need for Family Participation in Patient Care: Yes (Comment) Care giver support system in place?: Yes (comment)   Criminal Activity/Legal Involvement Pertinent to Current Situation/Hospitalization: No - Comment as needed  Activities of Daily Living Home Assistive Devices/Equipment: None ADL Screening (condition at time of admission) Patient's cognitive ability adequate to safely complete daily activities?: Yes Is the patient deaf or have difficulty hearing?: No Does  the patient have difficulty seeing, even when wearing glasses/contacts?: No Does the patient have difficulty concentrating, remembering, or making decisions?: Yes Patient able to express need for assistance with ADLs?: Yes Does the patient have difficulty dressing or bathing?: No Independently performs ADLs?: Yes (appropriate for developmental age) Does the patient have difficulty walking or climbing stairs?: No Weakness of Legs: None Weakness of Arms/Hands: Left  Permission Sought/Granted                  Emotional Assessment Appearance:: Appears stated age Attitude/Demeanor/Rapport: Engaged Affect (typically observed): Accepting Orientation: : Oriented to Self,Oriented to Place,Oriented to  Time,Oriented to Situation   Psych Involvement: No (comment)  Admission diagnosis:  Acute CVA (cerebrovascular accident) (HCC) [I63.9] Cerebrovascular accident (CVA), unspecified mechanism (HCC) [I63.9] Stroke (cerebrum) [facility]) [I63.9] Patient Active Problem List   Diagnosis Date Noted  . Tobacco use 07/10/2020  . 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 Acute CVA (cerebrovascular accident) (HCC) 07/09/2020  . Cervical radiculopathy 04/26/2020  . Left upper extremity numbness 04/26/2020  . 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 HTN (hypertension) 11/04/2014   PCP:  Pcp, No Pharmacy:   [facility] [address] [place] [address] Phone: [phone] Fax: [phone]     Social Determinants of Health (SDOH) Interventions    Readmission Risk Interventions No flowsheet data found.

## 2020-07-11 NOTE — Progress Notes (Signed)
STROKE TEAM PROGRESS NOTE   SUBJECTIVE (INTERVAL HISTORY) No family at the bedside. Pt sitting in chair, had no complains. Still has left arm and hand weakness. No numbness of left arm, left facial numbness much improved, only now left corner of mouth some numbness tingling. B/l LE good strength, has walked with PT OT today. Pending recs. NSG saw pt and recommend outpt follow up.   OBJECTIVE Temp:  [98.2 F (36.8 C)] 98.2 F (36.8 C) (05/03 0753) Pulse Rate:  [62-76] 76 (05/03 0753) Cardiac Rhythm: Normal sinus rhythm;Bundle branch block (05/02 1900) Resp:  [16-18] 16 (05/03 0753) BP: (140-152)/(86-97) 141/86 (05/03 0753) SpO2:  [98 %-100 %] 100 % (05/03 0753)  Recent Labs  Lab 07/09/20 2000  GLUCAP 168*   Recent Labs  Lab 07/09/20 2023 07/10/20 0301  NA 138 138  K 3.3* 3.9  CL 102 105  CO2 28 24  GLUCOSE 134* 81  BUN 17 16  CREATININE 1.28* 1.17  CALCIUM 8.9 8.6*   Recent Labs  Lab 07/09/20 2023  AST 28  ALT 16  ALKPHOS 107  BILITOT 0.8  PROT 7.0  ALBUMIN 3.6   Recent Labs  Lab 07/09/20 2023 07/10/20 0301  WBC 7.8 7.0  NEUTROABS 5.6  --   HGB 13.6 12.5*  HCT 40.4 37.6*  MCV 94.4 94.5  PLT 231 214   No results for input(s): CKTOTAL, CKMB, CKMBINDEX, TROPONINI in the last 168 hours. Recent Labs    07/09/20 2023  LABPROT 13.2  INR 1.0   Recent Labs    07/10/20 2108  COLORURINE COLORLESS*  LABSPEC 1.005  PHURINE 8.0  GLUCOSEU NEGATIVE  HGBUR SMALL*  BILIRUBINUR NEGATIVE  KETONESUR NEGATIVE  PROTEINUR NEGATIVE  NITRITE NEGATIVE  LEUKOCYTESUR NEGATIVE       Component Value Date/Time   CHOL 185 07/10/2020 0019   TRIG 120 07/10/2020 0019   HDL 43 07/10/2020 0019   CHOLHDL 4.3 07/10/2020 0019   VLDL 24 07/10/2020 0019   LDLCALC 118 (H) 07/10/2020 0019   Lab Results  Component Value Date   HGBA1C 4.5 (L) 07/10/2020      Component Value Date/Time   LABOPIA NONE DETECTED 07/10/2020 2108   COCAINSCRNUR NONE DETECTED 07/10/2020 2108    LABBENZ NONE DETECTED 07/10/2020 2108   AMPHETMU NONE DETECTED 07/10/2020 2108   THCU NONE DETECTED 07/10/2020 2108   LABBARB NONE DETECTED 07/10/2020 2108    Recent Labs  Lab 07/09/20 2023  ETH <10    I have personally reviewed the radiological images below and agree with the radiology interpretations.  CT ANGIO HEAD W OR WO CONTRAST  Result Date: 07/09/2020 CLINICAL DATA:  Initial evaluation for acute stroke. EXAM: CT ANGIOGRAPHY HEAD AND NECK TECHNIQUE: Multidetector CT imaging of the head and neck was performed using the standard protocol during bolus administration of intravenous contrast. Multiplanar CT image reconstructions and MIPs were obtained to evaluate the vascular anatomy. Carotid stenosis measurements (when applicable) are obtained utilizing NASCET criteria, using the distal internal carotid diameter as the denominator. CONTRAST:  50mL OMNIPAQUE IOHEXOL 350 MG/ML SOLN COMPARISON:  Prior head CT from earlier the same day. FINDINGS: CTA NECK FINDINGS Aortic arch: Examination somewhat technically limited by timing of the contrast bolus. Visualized aortic arch normal in caliber with normal 3 vessel morphology. No hemodynamically significant stenosis seen about the origin of the great vessels. Mild eccentric soft plaque noted within the distal aspect of the aortic arch itself. Right carotid system: Right CCA patent from its origin to the  bifurcation without stenosis. There is abrupt occlusion of the right ICA just distal to the bifurcation (series 7, image 202). Right ICA remains occluded within the neck. Left carotid system: Left common carotid artery patent from its origin to the bifurcation without stenosis. Mild atheromatous irregularity about the left carotid bulb/proximal left ICA without significant stenosis. Left ICA patent distally without stenosis, dissection or occlusion. Vertebral arteries: Both vertebral arteries arise from the subclavian arteries. No proximal subclavian artery  stenosis. Vertebral arteries patent without stenosis, dissection or occlusion. Skeleton: No visible acute osseous abnormality. No discrete or worrisome osseous lesions. Other neck: No other acute soft tissue abnormality within the neck. No mass or adenopathy. Upper chest: Visualized upper chest demonstrates no acute finding. Hypodensity noted within the distal left main pulmonary artery (series 5, image 180), favored to be artifactual and/or mixing artifact. Review of the MIP images confirms the above findings CTA HEAD FINDINGS Anterior circulation: Left internal carotid artery widely patent to the terminus without stenosis. Right ICA occluded at the skull base. Distal reconstitution at the supraclinoid segment via collateral flow across the circle-of-Willis. Left A1 segment widely patent. There is a focal severe distal right A1 stenosis (series 8, image 113). Normal anterior communicating artery complex. Anterior cerebral arteries remain well perfused to their distal aspects. Mildly attenuated flow within the right MCA as compared to the left. Right M1 segment remains perfused without stenosis. Normal right MCA bifurcation. Distal right MCA branches well perfused without visible downstream occlusion. Left M1 segment widely patent. Normal left MCA bifurcation. Distal left MCA branches well perfused. Posterior circulation: Both V4 segments patent to the vertebrobasilar junction without stenosis. Neither PICA origin well visualized. Focal hypodensity/irregularity present at the level of the vertebrobasilar junction (series 7, image 138). There is a suspected short-segment fenestration at this level, with possible underlying severe stenosis and/or artifact. A small amount of intraluminal thrombus somewhat difficult to exclude. The basilar artery is diminutive but patent distally. Superior cerebellar arteries grossly patent proximally. Both PCAs primarily supplied via the basilar. PCAs are perfused to their distal  aspects without appreciable stenosis. Venous sinuses: Grossly patent allowing for timing the contrast bolus, although evaluation fairly limited. Anatomic variants: None significant. Review of the MIP images confirms the above findings IMPRESSION: 1. 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 Distal reconstitution at the supraclinoid segment via collateral flow across the circle-of-Willis. Attenuated but patent flow within the right MCA distribution, with no visible downstream occlusion. 2. Focal hypodensity/irregularity at the level of the vertebrobasilar junction, indeterminate, but favored to reflect a short-segment fenestration with artifact or possible stenosis at this level. A small amount of intraluminal thrombus difficult to exclude, but felt to be less likely. If clinical symptoms should warrant, a repeat examination could be performed for further evaluation as needed. 3. Severe distal right A1 stenosis. Critical Value/emergent results were called by telephone at the time of interpretation on 07/09/2020 at approximately 10:45 pm to provider [facility] , who verbally acknowledged these results. Electronically Signed   By: Rise Mu M.D.   On: 07/09/2020 23:41   CT HEAD WO CONTRAST  Result Date: 07/10/2020 CLINICAL DATA:  Arm pain. Left-sided numbness. Right MCA territory infarction. EXAM: CT HEAD WITHOUT CONTRAST TECHNIQUE: Contiguous axial images were obtained from the base of the skull through the vertex without intravenous contrast. COMPARISON:  MRI earlier same day.  CT studies yesterday. FINDINGS: Brain: Blood is visible within the fourth ventricle, as seen on previous MRI but not  present on the head CT of yesterday. The etiology of this hemorrhage is not clear. Small vessel acute infarction in the right cerebellum appears the same. Scattered foci cortical low-density in the right middle cerebral artery territory, most notable in the right frontal operculum,  consistent with the previously seen right MCA territory infarctions. No evidence of progression. No evidence of mass effect or hemorrhage within the right MCA territory infarctions. No hydrocephalus or extra-axial collection. Vascular: No abnormal vascular finding. Skull: Normal Sinuses/Orbits: No sinus inflammatory disease.  Orbits negative. Other: None IMPRESSION: Areas of acute infarction in the right MCA territory, most notable in the frontal operculum, appear stable the yesterday's CT. Small right cerebellar acute infarction. Hemorrhage in the fourth ventricle appears similar to the MRI of earlier today, but was not present on the CT of yesterday. The etiology of this hemorrhage is unclear. Electronically Signed   By: Paulina Fusi M.D.   On: 07/10/2020 12:49   CT HEAD WO CONTRAST  Result Date: 07/09/2020 CLINICAL DATA:  Initial evaluation for acute mental status change, stroke suspected. Left-sided numbness. EXAM: CT HEAD WITHOUT CONTRAST TECHNIQUE: Contiguous axial images were obtained from the base of the skull through the vertex without intravenous contrast. COMPARISON:  Prior CT from 03/07/2020. FINDINGS: Brain: Cerebral volume within normal limits for age. Subtle hypodensity seen involving the mid and posterior right frontoparietal region, concerning for acute right MCA distribution infarct. Mild patchy involvement of the right insula. No intracranial hemorrhage. No other acute large vessel territory infarct. No mass lesion, midline shift or mass effect. No hydrocephalus or extra-axial fluid collection. Vascular: No visible hyperdense vessel. Skull: Scalp soft tissues and calvarium within normal limits. Sinuses/Orbits: Globes and orbital soft tissues demonstrate no acute finding. Question mild proptosis. Few small retention cyst noted within the left maxillary sinus. Paranasal sinuses are otherwise clear. No mastoid effusion. Other: None. IMPRESSION: 1. Subtle hypodensity involving the mid and posterior  right frontoparietal region, concerning for acute right MCA distribution infarct. No intracranial hemorrhage. 2. Question mild proptosis. Correlation with physical exam recommended. Results communicated by telephone at the time of interpretation on 07/09/2020 at 9:38 pm to provider [facility] , who verbally acknowledged these results. Electronically Signed   By: Rise Mu M.D.   On: 07/09/2020 21:39   CT ANGIO NECK W OR WO CONTRAST  Result Date: 07/09/2020 CLINICAL DATA:  Initial evaluation for acute stroke. EXAM: CT ANGIOGRAPHY HEAD AND NECK TECHNIQUE: Multidetector CT imaging of the head and neck was performed using the standard protocol during bolus administration of intravenous contrast. Multiplanar CT image reconstructions and MIPs were obtained to evaluate the vascular anatomy. Carotid stenosis measurements (when applicable) are obtained utilizing NASCET criteria, using the distal internal carotid diameter as the denominator. CONTRAST:  11mL OMNIPAQUE IOHEXOL 350 MG/ML SOLN COMPARISON:  Prior head CT from earlier the same day. FINDINGS: CTA NECK FINDINGS Aortic arch: Examination somewhat technically limited by timing of the contrast bolus. Visualized aortic arch normal in caliber with normal 3 vessel morphology. No hemodynamically significant stenosis seen about the origin of the great vessels. Mild eccentric soft plaque noted within the distal aspect of the aortic arch itself. Right carotid system: Right CCA patent from its origin to the bifurcation without stenosis. There is abrupt occlusion of the right ICA just distal to the bifurcation (series 7, image 202). Right ICA remains occluded within the neck. Left carotid system: Left common carotid artery patent from its origin to the bifurcation without stenosis. Mild atheromatous irregularity about the  left carotid bulb/proximal left ICA without significant stenosis. Left ICA patent distally without stenosis, dissection or occlusion. Vertebral  arteries: Both vertebral arteries arise from the subclavian arteries. No proximal subclavian artery stenosis. Vertebral arteries patent without stenosis, dissection or occlusion. Skeleton: No visible acute osseous abnormality. No discrete or worrisome osseous lesions. Other neck: No other acute soft tissue abnormality within the neck. No mass or adenopathy. Upper chest: Visualized upper chest demonstrates no acute finding. Hypodensity noted within the distal left main pulmonary artery (series 5, image 180), favored to be artifactual and/or mixing artifact. Review of the MIP images confirms the above findings CTA HEAD FINDINGS Anterior circulation: Left internal carotid artery widely patent to the terminus without stenosis. Right ICA occluded at the skull base. Distal reconstitution at the supraclinoid segment via collateral flow across the circle-of-Willis. Left A1 segment widely patent. There is a focal severe distal right A1 stenosis (series 8, image 113). Normal anterior communicating artery complex. Anterior cerebral arteries remain well perfused to their distal aspects. Mildly attenuated flow within the right MCA as compared to the left. Right M1 segment remains perfused without stenosis. Normal right MCA bifurcation. Distal right MCA branches well perfused without visible downstream occlusion. Left M1 segment widely patent. Normal left MCA bifurcation. Distal left MCA branches well perfused. Posterior circulation: Both V4 segments patent to the vertebrobasilar junction without stenosis. Neither PICA origin well visualized. Focal hypodensity/irregularity present at the level of the vertebrobasilar junction (series 7, image 138). There is a suspected short-segment fenestration at this level, with possible underlying severe stenosis and/or artifact. A small amount of intraluminal thrombus somewhat difficult to exclude. The basilar artery is diminutive but patent distally. Superior cerebellar arteries grossly  patent proximally. Both PCAs primarily supplied via the basilar. PCAs are perfused to their distal aspects without appreciable stenosis. Venous sinuses: Grossly patent allowing for timing the contrast bolus, although evaluation fairly limited. Anatomic variants: None significant. Review of the MIP images confirms the above findings IMPRESSION: 1. 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 Distal reconstitution at the supraclinoid segment via collateral flow across the circle-of-Willis. Attenuated but patent flow within the right MCA distribution, with no visible downstream occlusion. 2. Focal hypodensity/irregularity at the level of the vertebrobasilar junction, indeterminate, but favored to reflect a short-segment fenestration with artifact or possible stenosis at this level. A small amount of intraluminal thrombus difficult to exclude, but felt to be less likely. If clinical symptoms should warrant, a repeat examination could be performed for further evaluation as needed. 3. Severe distal right A1 stenosis. Critical Value/emergent results were called by telephone at the time of interpretation on 07/09/2020 at approximately 10:45 pm [facility] , who verbally acknowledged these results. Electronically Signed   By: Rise MuBenjamin  McClintock M.D.   On: 07/09/2020 23:41   MR BRAIN WO CONTRAST  Addendum Date: 07/10/2020   ADDENDUM REPORT: 07/10/2020 05:54 ADDENDUM: This study was discussed by telephone with Dr. Erick BlinksSALMAN KHALIQDINA on 07/10/2020 at 0544 hours. Electronically Signed   By: Odessa FlemingH  Hall M.D.   On: 07/10/2020 05:54   Result Date: 07/10/2020 CLINICAL DATA:  55 year old male with left side numbness. 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 No tPA/thrombectomy. EXAM: MRI HEAD WITHOUT CONTRAST TECHNIQUE: Multiplanar, multiecho pulse sequences of the brain and surrounding structures were obtained  without intravenous contrast. COMPARISON:  CT head, CTA head and neck yesterday. FINDINGS: Brain: Widespread patchy and  scattered cortical and subcortical white matter restricted diffusion in the right MCA territory mostly affecting the right middle frontal gyrus and the mid right parietal lobe. Confluent involvement at the right frontal operculum corresponding to the CT finding on series 5, image 87. The deep white matter and deep gray nuclei are relatively spared. There are scattered small infarcts also in the right occipital lobe laterally. Cytotoxic edema. Petechial hemorrhage on series 14, image 43. Additionally there are several small linear foci of restricted diffusion in the right cerebellum (series 5, image 65). No other posterior fossa restricted diffusion. No left cerebral hemisphere restricted diffusion. Furthermore, there is a small volume of intraventricular hemorrhage in the 4th ventricle, not apparent on the CT yesterday. Chronic microhemorrhage in the left parietal lobe. No other IVH. No midline shift, mass effect, evidence of mass lesion, ventriculomegaly, or other hemorrhage. Cervicomedullary junction and pituitary are within normal limits. No significant signal abnormality in the brain parenchyma beyond the above findings. Vascular: Loss of the right ICA flow void in the neck and through the anterior genu with some reconstitution at the terminus. There is a right MCA M1 flow void. Other major intracranial vascular flow voids are preserved. Skull and upper cervical spine: Cervical spine is detailed separately today. Skull bone marrow signal is within normal limits. Sinuses/Orbits: Orbit motion artifact. No orbits soft tissue abnormality identified. Mucous retention cyst left maxillary sinus and mild or trace sinus mucosal thickening elsewhere. Other: Trace left mastoid effusion. Grossly normal visible internal auditory structures. Negative visible scalp and face. IMPRESSION: 1. Abnormal right ICA  as seen by CTA with widely scattered generally small cortical and subcortical white matter infarcts in the right MCA territory. Some involvement of the lateral right PCA/watershed area. Most confluent involvement in the right frontal operculum corresponds to the CT abnormality. 2. Small acute infarct also in the right cerebellum. 3. There is both petechial hemorrhage in the posterior right MCA territory as well as a small volume of Intraventricular Hemorrhage in the 4th ventricle, which was not apparent by CT yesterday. But no other IVH or extra-axial blood is identified. No significant intracranial mass effect. 4. Generally negative for age MRI appearance of the brain elsewhere. Cervical Spine MRI is reported separately. Electronically Signed: By: Odessa Fleming M.D. On: 07/10/2020 05:41   MR CERVICAL SPINE WO CONTRAST  Result Date: 07/10/2020 CLINICAL DATA:  55 year old male with left side numbness. 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 No tPA/thrombectomy. Neck pain. EXAM: MRI CERVICAL SPINE WITHOUT CONTRAST TECHNIQUE: Multiplanar, multisequence MR imaging of the cervical spine was performed. No intravenous contrast was administered. COMPARISON:  [facility] cervical spine CT 03/07/2020. Brain MRI today reported separately.  CTA yesterday. FINDINGS: Alignment: Mild straightening of cervical lordosis compared to the December CT. No significant spondylolisthesis. Vertebrae: Faint degenerative appearing endplate marrow edema at C5-C6. Background heterogeneous bone marrow signal, although more normal marrow signal at the skull base. Cord: Suboptimal cord detail on axial images today due to motion. 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 Above and below that level no  definite cord signal abnormality. Posterior Fossa, vertebral arteries, paraspinal tissues: Cervicomedullary junction is within normal limits. See brain findings today reported separately. Abnormal right ICA flow void in the neck just distal to the bifurcation. Other major  arterial flow voids in the neck appear preserved and this is concordant with the CTA yesterday. Otherwise negative visible neck soft tissues, lung apices. Disc levels: C2-C3: Mild circumferential disc bulge and endplate spurring. No spinal stenosis. Mild to moderate C3 foraminal stenosis. C3-C4: Disc space loss with circumferential disc bulge and endplate spurring eccentric to the left. Mild spinal stenosis. No definite cord mass effect. Moderate to severe left greater than right C4 foraminal stenosis. C4-C5: Circumferential disc bulging with mild endplate spurring. No stenosis. C5-C6: More lobulated disc osteophyte complex eccentric to the left and best seen on series 15, image 20. Spinal stenosis with mild left hemi cord mass effect. Moderate left and severe right C6 foraminal stenosis. C6-C7: Circumferential disc osteophyte complex with broad-based posterior component. Mild spinal stenosis. Mild if any cord mass effect. Severe bilateral C7 foraminal stenosis. C7-T1: Mild facet hypertrophy greater on the right. Mild left and mild to moderate right C8 foraminal stenosis. Visible upper thoracic spine degeneration but no upper thoracic spinal stenosis. IMPRESSION: 1. Multilevel cervical disc and endplate degeneration. Multilevel mild spinal stenosis with mild cord mass effect at 1 or more levels. 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 2. No other spinal cord signal abnormality identified. Moderate to severe neural foraminal stenosis also at the bilateral C4, C6, and right C8 nerve levels. 3. 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 Preliminary report of the  above discussed by telephone with Dr. Erick Blinks on 07/10/2020 at 0544 hours. Electronically Signed   By: Odessa Fleming M.D.   On: 07/10/2020 05:53   ECHOCARDIOGRAM COMPLETE BUBBLE STUDY  Result Date: 07/10/2020    ECHOCARDIOGRAM REPORT   Patient Name:   Robert [name] Date of Exam: 07/10/2020 Medical Rec #:  [id number]     Height:       72.0 in Accession #:    [id number]    Weight:       170.0 lb Date of Birth:  [birth year]     BSA:          1.988 m Patient Age:    54 years      BP:           144/81 mmHg Patient Gender: M             HR:           55 bpm. Exam Location:  Inpatient Procedure: 2D Echo, Cardiac Doppler, Color Doppler and Saline Contrast Bubble            Study Indications:    Stroke  History:        Patient has no prior history of Echocardiogram examinations.                 Risk Factors:Hypertension and Current Smoker.  Sonographer:    Shirlean Kelly Referring Phys: [id number] [facility] IMPRESSIONS  1. Left ventricular ejection fraction, by estimation, is 55 to 60%. The left ventricle has normal function. The left ventricle has no regional wall motion abnormalities. Left ventricular diastolic parameters are consistent with Grade I diastolic dysfunction (impaired relaxation).  2. Right ventricular systolic function is normal. The right ventricular size is normal. There is normal pulmonary artery systolic pressure. The estimated right ventricular systolic pressure is 27.2 mmHg.  3. Negative bubble study, no evidence for PFO or ASD.  4. The mitral valve is normal in structure. Trivial mitral valve regurgitation. No evidence of mitral stenosis.  5. The  aortic valve is tricuspid. Aortic valve regurgitation is not visualized. No aortic stenosis is present.  6. The inferior vena cava is normal in size with greater than 50% respiratory variability, suggesting right atrial pressure of 3 mmHg. FINDINGS  Left Ventricle: Left ventricular ejection fraction, by estimation, is 55 to 60%. The left ventricle has  normal function. The left ventricle has no regional wall motion abnormalities. The left ventricular internal cavity size was normal in size. There is  no left ventricular hypertrophy. Left ventricular diastolic parameters are consistent with Grade I diastolic dysfunction (impaired relaxation). Right Ventricle: The right ventricular size is normal. No increase in right ventricular wall thickness. Right ventricular systolic function is normal. There is normal pulmonary artery systolic pressure. The tricuspid regurgitant velocity is 2.46 m/s, and  with an assumed right atrial pressure of 3 mmHg, the estimated right ventricular systolic pressure is 27.2 mmHg. Left Atrium: Left atrial size was normal in size. Right Atrium: Right atrial size was normal in size. Pericardium: There is no evidence of pericardial effusion. Mitral Valve: The mitral valve is normal in structure. Trivial mitral valve regurgitation. No evidence of mitral valve stenosis. Tricuspid Valve: The tricuspid valve is normal in structure. Tricuspid valve regurgitation is trivial. Aortic Valve: The aortic valve is tricuspid. Aortic valve regurgitation is not visualized. No aortic stenosis is present. Aortic valve mean gradient measures 6.0 mmHg. Aortic valve peak gradient measures 11.3 mmHg. Aortic valve area, by VTI measures 2.46  cm. Pulmonic Valve: The pulmonic valve was normal in structure. Pulmonic valve regurgitation is trivial. Aorta: The aortic root is normal in size and structure. Venous: The inferior vena cava is normal in size with greater than 50% respiratory variability, suggesting right atrial pressure of 3 mmHg. IAS/Shunts: Negative bubble study, no evidence for PFO or ASD. Agitated saline contrast was given intravenously to evaluate for intracardiac shunting.  LEFT VENTRICLE PLAX 2D LVIDd:         4.60 cm  Diastology LVIDs:         3.00 cm  LV e' medial:    6.42 cm/s LV PW:         1.20 cm  LV E/e' medial:  8.6 LV IVS:        1.10 cm  LV  e' lateral:   10.90 cm/s LVOT diam:     2.20 cm  LV E/e' lateral: 5.1 LV SV:         83 LV SV Index:   42 LVOT Area:     3.80 cm  RIGHT VENTRICLE             IVC RV Basal diam:  3.60 cm     IVC diam: 1.20 cm RV S prime:     13.70 cm/s TAPSE (M-mode): 2.4 cm LEFT ATRIUM             Index       RIGHT ATRIUM           Index LA diam:        3.20 cm 1.61 cm/m  RA Area:     14.50 cm LA Vol (A2C):   48.7 ml 24.49 ml/m RA Volume:   35.60 ml  17.90 ml/m LA Vol (A4C):   39.8 ml 20.02 ml/m LA Biplane Vol: 44.8 ml 22.53 ml/m  AORTIC VALVE AV Area (Vmax):    2.90 cm AV Area (Vmean):   2.59 cm AV Area (VTI):     2.46 cm AV Vmax:  168.00 cm/s AV Vmean:          119.000 cm/s AV VTI:            0.339 m AV Peak Grad:      11.3 mmHg AV Mean Grad:      6.0 mmHg LVOT Vmax:         128.00 cm/s LVOT Vmean:        81.000 cm/s LVOT VTI:          0.219 m LVOT/AV VTI ratio: 0.65  AORTA Ao Root diam: 3.30 cm Ao Asc diam:  3.30 cm MITRAL VALVE               TRICUSPID VALVE MV Area (PHT): 3.21 cm    TR Peak grad:   24.2 mmHg MV Decel Time: 236 msec    TR Vmax:        246.00 cm/s MV E velocity: 55.30 cm/s MV A velocity: 68.40 cm/s  SHUNTS MV E/A ratio:  0.81        Systemic VTI:  0.22 m                            Systemic Diam: 2.20 cm Marca Ancona MD Electronically signed by Marca Ancona MD Signature Date/Time: 07/10/2020/1:32:21 PM    Final     PHYSICAL EXAM  Temp:  [98.2 F (36.8 C)] 98.2 F (36.8 C) (05/03 0753) Pulse Rate:  [62-76] 76 (05/03 0753) Resp:  [16-18] 16 (05/03 0753) BP: (140-152)/(86-97) 141/86 (05/03 0753) SpO2:  [98 %-100 %] 100 % (05/03 0753)  General - Well nourished, well developed, in no apparent distress.  Ophthalmologic - fundi not visualized due to noncooperation.  Cardiovascular - Regular rhythm and rate.  Mental Status -  Level of arousal and orientation to time, place, and person were intact. Language including expression, naming, repetition, comprehension was assessed and  found intact. Fund of Knowledge was assessed and was intact.  Cranial Nerves II - XII - II - Visual field intact OU. III, IV, VI - Extraocular movements intact. V - Facial sensation decreased on the left cheek VII - left nasolabial fold flattening but equal movement b/l. VIII - Hearing & vestibular intact bilaterally. X - Palate elevates symmetrically. XI - Chin turning & shoulder shrug intact bilaterally. XII - Tongue protrusion intact.  Motor Strength - The patient's strength was normal in RUE and BLEs, however left UE proximal and bicep and tricep and wrist extension 3/5, finger grip 1/5.  Bulk was normal and fasciculations were absent.    Motor Tone - Muscle tone was assessed at the neck and appendages and was increased left UE.  Reflexes - The patient's reflexes were 1+ in all extremities and he had no pathological reflexes, no hoffman's bilaterally.  Sensory - Light touch, temperature/pinprick were assessed and were symmetrical  Coordination - The patient had normal movements in the right hand with no ataxia or dysmetria.  Tremor was absent.  Gait and Station - deferred.   ASSESSMENT/PLAN Mr. Travanti Mcmanus is a 55 y.o. male with history of hypertension, hyperlipidemia, smoker admitted for left arm hand weakness for 6 months, worsening for [redacted] weeks along with left facial numbness. No tPA given due to outside window.    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 The punctate right cerebellum infarct could be synchronized small vessel etiology given uncontrolled risk factors, however, cardioembolic source is  still in DDx  CT head right MCA frontoparietal infarct  CTA head and neck right ICA occlusion, right MCA attenuated flow, right A1 high-grade stenosis.  MRI right MCA and watershed area scattered infarcts.  Right cerebellum punctate infarct.  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 fib  LDL 118  HgbA1c 4.5  Hypercoagulable work-up pending  UDS neg  lovenox for VTE prophylaxis  No antithrombotic prior to admission, now on No antithrombotic given IVH at this time.   Ongoing aggressive stroke risk factor management  Therapy recommendations:  pending  Disposition:  pending  Small IVH, incidental finding, could be related to small vessel source given risk factors  MRI showed a small volume of Intraventricular Hemorrhage in the 4th ventricle  Etiology unclear  Hold off antiplatelet for now  Will repeat CT head in am.   Cervical myelopathy   MRI C-spine C6-7 myelopathy with cervical stenosis  Likely the cause for his neck pain, shoulder pain and 6 month duration of left arm and hand weakness  NSG consulted and recommend outpt follow up  Carotid occlusion  CTA head and neck showed right ICA occluded at bulb  No intervention needed at this time  Consider antiplatelet once IVH stable  Hypertension . Stable on the high end . Resume home meds - norvasc 10 and HCTZ 12.5 and lisinopril 20 . BP goal < 160 given IVH  Long term BP goal normotensive   Hyperlipidemia  Home meds:  none   LDL 118, goal < 70  Now on lipitor 40  Continue statin at discharge  Tobacco abuse  Current smoker  1PPD for 40 years now  Smoking cessation counseling provided  Pt is willing to quit  Other Stroke Risk Factors    Other Active Problems    Hospital day # 1   Marvel Plan, MD PhD Stroke Neurology 07/11/2020 11:28 AM    To contact Stroke Continuity provider, please refer to [email or web address]. After hours, contact General Neurology

## 2020-07-12 ENCOUNTER — Inpatient Hospital Stay (HOSPITAL_COMMUNITY): Payer: Medicaid Other

## 2020-07-12 ENCOUNTER — Other Ambulatory Visit: Payer: Self-pay | Admitting: Cardiology

## 2020-07-12 ENCOUNTER — Telehealth: Payer: Self-pay | Admitting: Cardiology

## 2020-07-12 ENCOUNTER — Other Ambulatory Visit: Payer: Self-pay | Admitting: Neurology

## 2020-07-12 DIAGNOSIS — I639 Cerebral infarction, unspecified: Secondary | ICD-10-CM

## 2020-07-12 DIAGNOSIS — I6522 Occlusion and stenosis of left carotid artery: Secondary | ICD-10-CM

## 2020-07-12 LAB — BASIC METABOLIC PANEL
Anion gap: 5 (ref 5–15)
BUN: 16 mg/dL (ref 6–20)
CO2: 25 mmol/L (ref 22–32)
Calcium: 9 mg/dL (ref 8.9–10.3)
Chloride: 105 mmol/L (ref 98–111)
Creatinine, Ser: 1.2 mg/dL (ref 0.61–1.24)
GFR, Estimated: >60 mL/min (ref >60)
Glucose, Bld: 104 mg/dL — ABNORMAL HIGH (ref 70–99)
Potassium: 3.6 mmol/L (ref 3.5–5.1)
Sodium: 135 mmol/L (ref 135–145)

## 2020-07-12 LAB — CBC
HCT: 39.4 % (ref 39.0–52.0)
Hemoglobin: 13.6 g/dL (ref 13.0–17.0)
MCH: 31.5 pg (ref 26.0–34.0)
MCHC: 34.5 g/dL (ref 30.0–36.0)
MCV: 91.2 fL (ref 80.0–100.0)
Platelets: 219 10*3/uL (ref 150–400)
RBC: 4.32 MIL/uL (ref 4.22–5.81)
RDW: 11.4 % — ABNORMAL LOW (ref 11.5–15.5)
WBC: 6.1 10*3/uL (ref 4.0–10.5)
nRBC: 0 % (ref 0.0–0.2)

## 2020-07-12 IMAGING — CT CT HEAD W/O CM
4 series · 16 of 47 positions shown, 18 images · non-contrast
Comparison: Head CT [DATE] and brain MRI [DATE]

CLINICAL DATA: Stroke follow-up

EXAM:
CT HEAD WITHOUT CONTRAST
TECHNIQUE: Contiguous axial images were obtained from the base of the skull
through the vertex without intravenous contrast.

[Series 3: head without · axial · non-contrast · 0.44mm/px · z∈[-65,+55]mm · 7 of 33 slices shown, 9 images]
[im 5/33  brain]
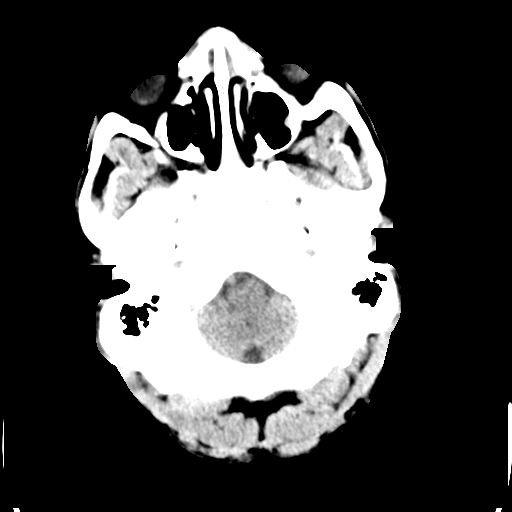
[im 5/33  bone]
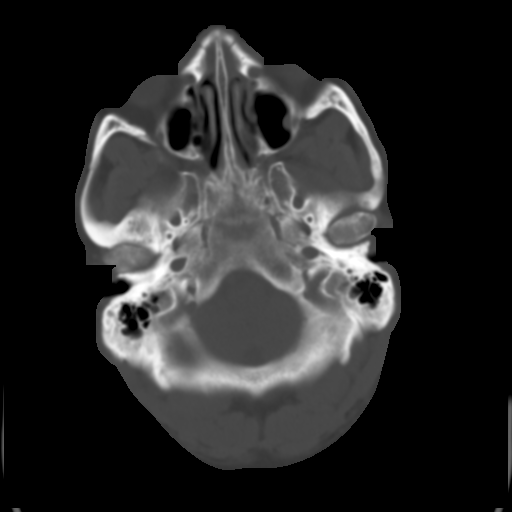
[im 9/33  brain]
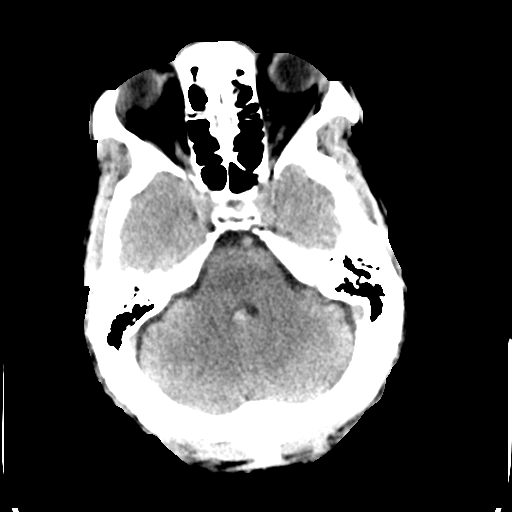
[im 13/33  brain]
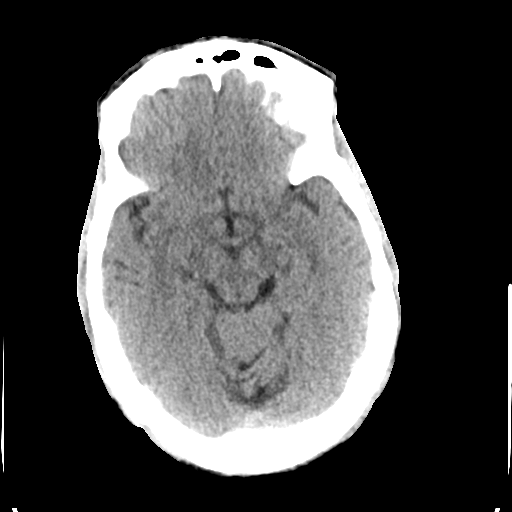
[im 17/33  brain]
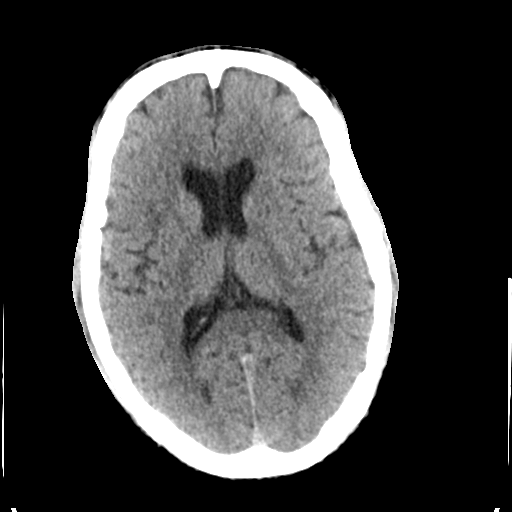
[im 21/33  brain]
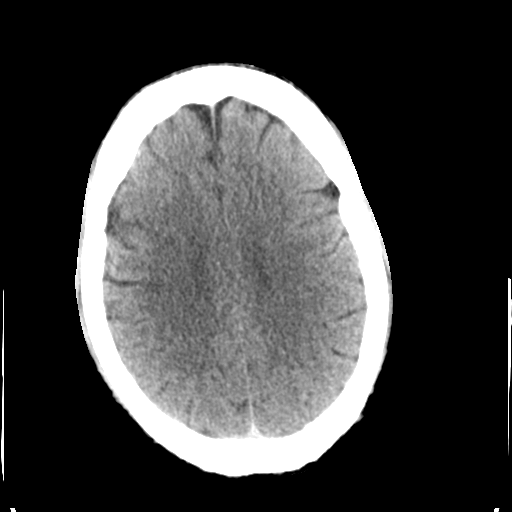
[im 21/33  bone]
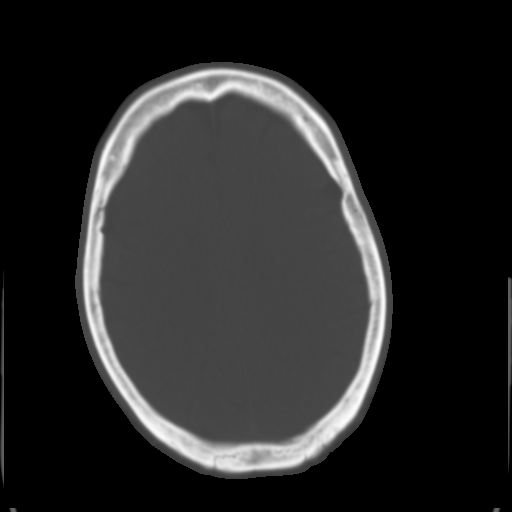
[im 25/33  brain]
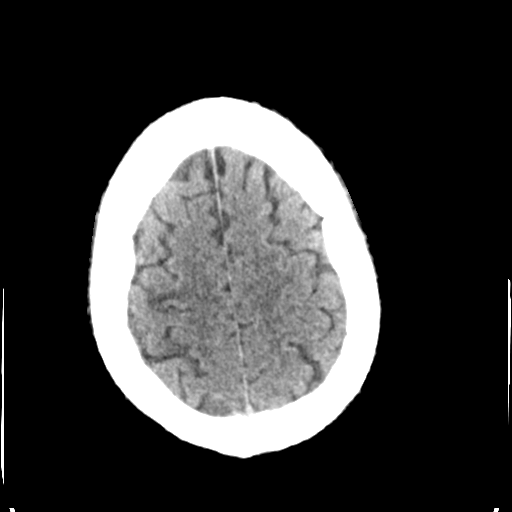
[im 29/33  brain]
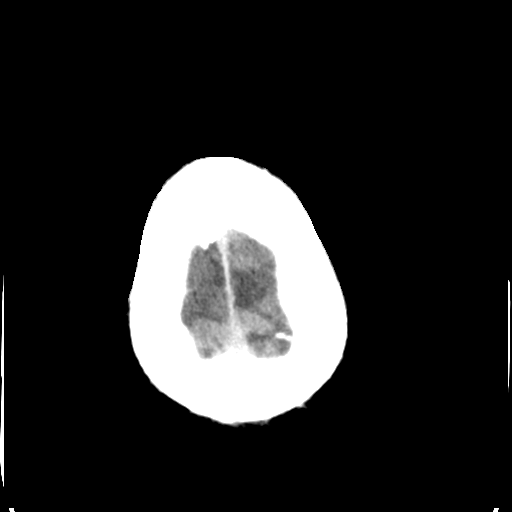

[Series 4: head bone · axial · 0.44mm/px · z∈[-69,-37]mm · 3 of 81 slices shown]
[im 9/81  bone]
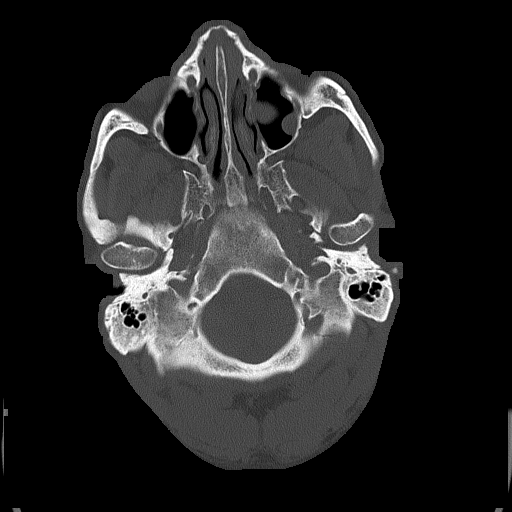
[im 17/81  bone]
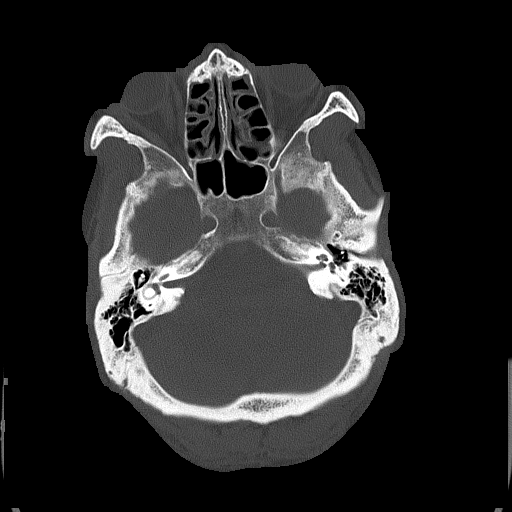
[im 25/81  bone]
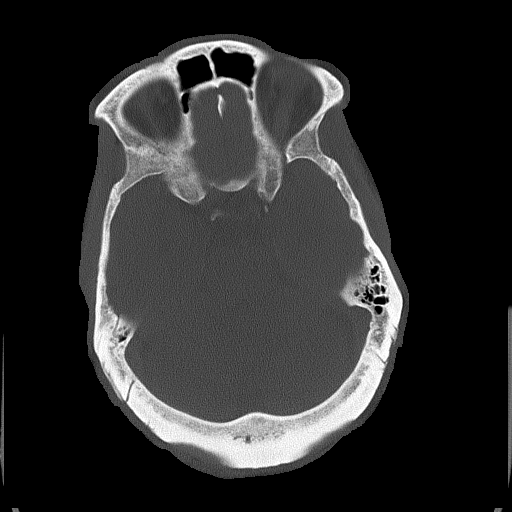

[Series 5: head without cor · coronal · non-contrast · 0.34mm/px · 3 of 78 slices shown]
[im 26/78  brain]
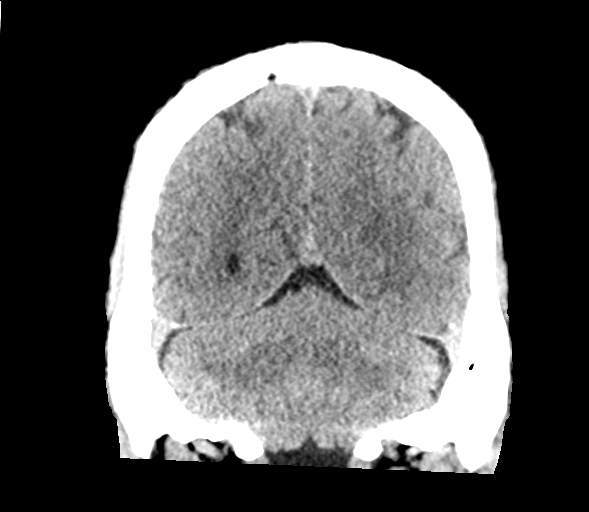
[im 35/78  brain]
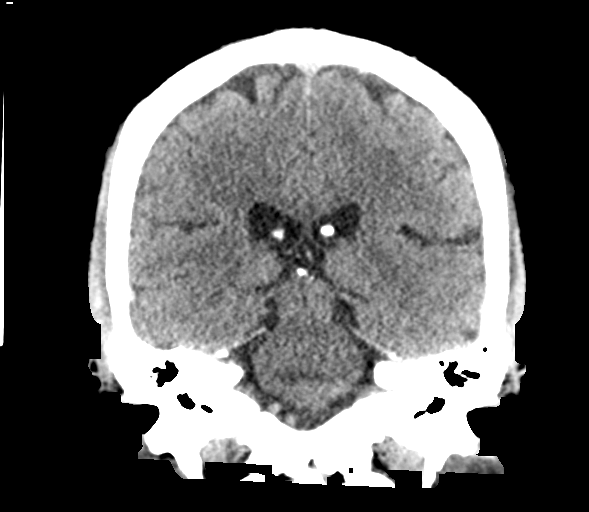
[im 43/78  brain]
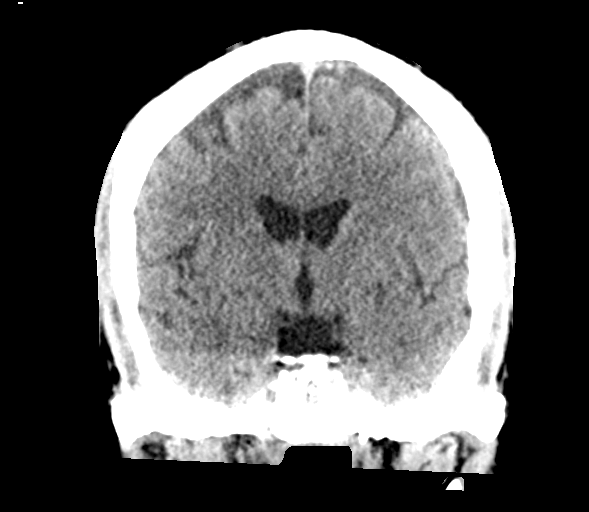

[Series 6: head without sag · sagittal · non-contrast · 0.35mm/px · 3 of 67 slices shown]
[im 23/67  brain]
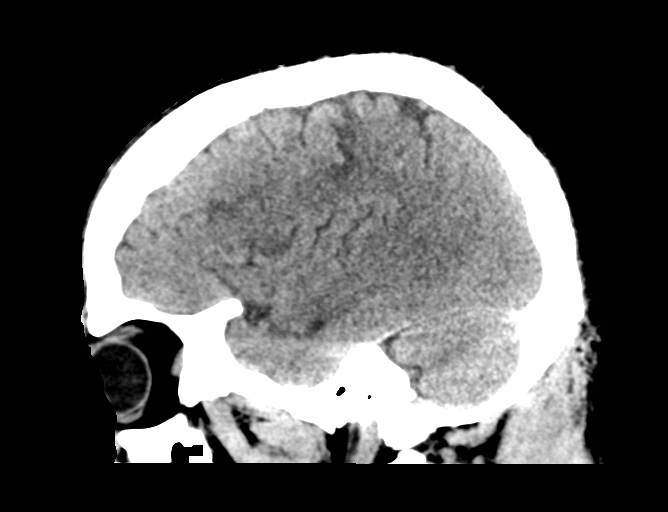
[im 34/67  brain]
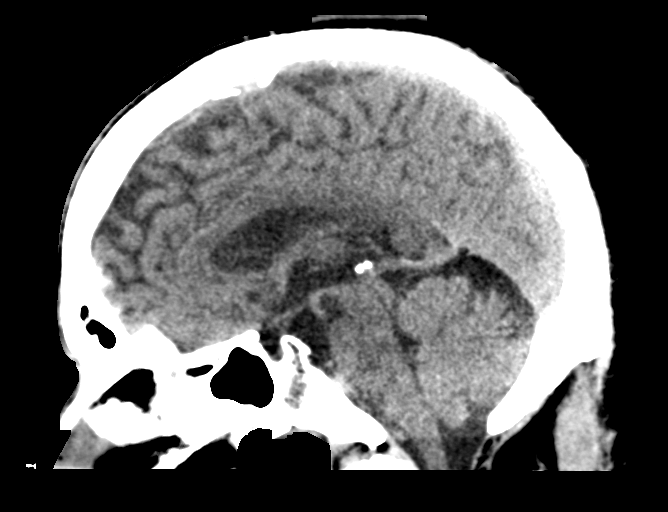
[im 45/67  brain]
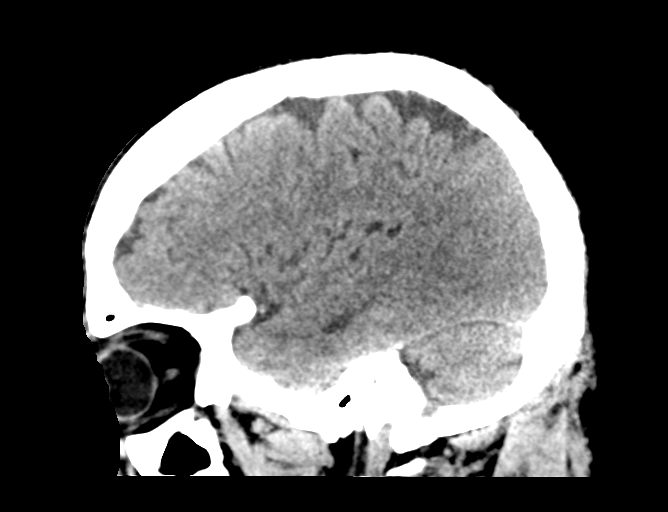

[16 of 47 positions shown; findings below may reference images not displayed]

FINDINGS: Brain: Unchanged appearance of recent infarct in the subcortical
right frontal lobe. No acute hemorrhage. No midline shift or other
mass effect. Ventricles are normal.

Vascular: No abnormal hyperdensity of the major intracranial
arteries or dural venous sinuses. No intracranial atherosclerosis.

Skull: The visualized skull base, calvarium and extracranial soft
tissues are normal.

Sinuses/Orbits: No fluid levels or advanced mucosal thickening of
the visualized paranasal sinuses. No mastoid or middle ear effusion.
The orbits are normal.
IMPRESSION: Unchanged appearance of recent infarct in the subcortical right
frontal lobe. No acute hemorrhage or mass effect.

## 2020-07-12 MED ORDER — ASPIRIN EC 81 MG PO TBEC
81.0000 mg | DELAYED_RELEASE_TABLET | Freq: Every day | ORAL | 1 refills | Status: AC
  Filled 2020-07-12: qty 30, 30d supply, fill #0

## 2020-07-12 MED ORDER — ATORVASTATIN CALCIUM 40 MG PO TABS
40.0000 mg | ORAL_TABLET | Freq: Every day | ORAL | 1 refills | Status: DC
  Filled 2020-07-12: qty 30, 30d supply, fill #0
  Filled 2020-08-29 – 2020-09-08 (×2): qty 30, 30d supply, fill #1

## 2020-07-12 MED ORDER — NICOTINE 14 MG/24HR TD PT24
14.0000 mg | MEDICATED_PATCH | Freq: Every day | TRANSDERMAL | 0 refills | Status: DC
  Filled 2020-07-12: qty 28, 28d supply, fill #0

## 2020-07-12 NOTE — Progress Notes (Signed)
Physical Therapy Treatment Patient Details Name: Robert [name] MRN: [id number] DOB: [birth year] Today's Date: 07/12/2020    History of Present Illness Pt is a 55 y/o male with who presented to ED for evaluation of intermittent L hand weakness on 07/09/20. Reports L hand weakness, facial numbness and decreased vision. CT reveals acute R MCA infarct. Symptoms present for several months but worsened over past 5-6 days (pt delayed care due to no insurance). PMH of HTN and tobacco use    PT Comments    Pt agreeable to OOB mobility. Pt with improved dynamic balance today, tolerating multiple challenges to balance well. Pt also demonstrating improved activity tolerance overall, ambulating great hallway distance. Pt continues to have difficulty with some aspects of dynamic balance as well as orientation in space, pt listing R with no knowledge of this throughout gait. Pt remains appropriate for OPPT.    Follow Up Recommendations  Outpatient PT;Supervision/Assistance - 24 hour     Equipment Recommendations  None recommended by PT    Recommendations for Other Services       Precautions / Restrictions Precautions Precautions: Fall Restrictions Weight Bearing Restrictions: No    Mobility  Bed Mobility Overal bed mobility: Modified Independent             General bed mobility comments: no assist required    Transfers Overall transfer level: Needs assistance Equipment used: None Transfers: Sit to/from Stand Sit to Stand: Supervision         General transfer comment: for safety only, no physical assist  Ambulation/Gait Ambulation/Gait assistance: Supervision;Min guard Gait Distance (Feet): 750 Feet Assistive device: None Gait Pattern/deviations: Step-through pattern;Staggering right Gait velocity: decr   General Gait Details: Supervision for safety, occasional closer guard to prevent bumping into objects on R as pt with R listing. PT challenged pt balance, and pt tolerated  well.   Stairs             Wheelchair Mobility    Modified Rankin (Stroke Patients Only) Modified Rankin (Stroke Patients Only) Pre-Morbid Rankin Score: No symptoms Modified Rankin: Moderate disability     Balance Overall balance assessment: Needs assistance Sitting-balance support: No upper extremity supported Sitting balance-Leahy Scale: Normal     Standing balance support: No upper extremity supported Standing balance-Leahy Scale: Good               High level balance activites: Backward walking;Direction changes;Head turns High Level Balance Comments: WFL fast/slow walking, step over object, backwards walking, pick up object from ground. Min difficulty with tandem gait x10 ft with unsteadiness/UE support, weaving through objects            Cognition Arousal/Alertness: Awake/alert Behavior During Therapy: Flat affect Overall Cognitive Status: Impaired/Different from baseline Area of Impairment: Following commands;Safety/judgement;Awareness;Problem solving                       Following Commands: Follows one step commands consistently;Follows multi-step commands with increased time Safety/Judgement: Decreased awareness of deficits;Decreased awareness of safety Awareness: Emergent Problem Solving: Slow processing;Decreased initiation;Difficulty sequencing;Requires verbal cues;Requires tactile cues General Comments: PT pointing out multiple deficits during session, pt acknowledges but does not adjust behavior.      Exercises      General Comments        Pertinent Vitals/Pain Pain Assessment: Faces Faces Pain Scale: No hurt Pain Intervention(s): Monitored during session    Home Living  Prior Function            PT Goals (current goals can now be found in the care plan section) Acute Rehab PT Goals Patient Stated Goal: to get better, use my L hand PT Goal Formulation: With patient Time For Goal  Achievement: 07/25/20 Potential to Achieve Goals: Good Progress towards PT goals: Progressing toward goals    Frequency    Min 4X/week      PT Plan Current plan remains appropriate    Co-evaluation              AM-PAC PT "6 Clicks" Mobility   Outcome Measure  Help needed turning from your back to your side while in a flat bed without using bedrails?: None Help needed moving from lying on your back to sitting on the side of a flat bed without using bedrails?: None Help needed moving to and from a bed to a chair (including a wheelchair)?: None Help needed standing up from a chair using your arms (e.g., wheelchair or bedside chair)?: None Help needed to walk in hospital room?: A Little Help needed climbing 3-5 steps with a railing? : A Little 6 Click Score: 22    End of Session Equipment Utilized During Treatment: Gait belt Activity Tolerance: Patient tolerated treatment well Patient left: with call bell/phone within reach;in bed;with bed alarm set Nurse Communication: Mobility status PT Visit Diagnosis: Unsteadiness on feet (R26.81)     Time: 8841-6606 PT Time Calculation (min) (ACUTE ONLY): 13 min  Charges:  $Gait Training: 8-22 mins                      Marye Round, PT DPT Acute Rehabilitation Services Pager [phone]  Office [phone]    Tyrone Apple E Christain [place] 07/12/2020, 12:44 PM

## 2020-07-12 NOTE — TOC Transition Note (Addendum)
Transition of Care [facility]) - CM/SW Discharge Note   Patient Details  Name: Robert [name] MRN: [id number] Date of Birth: [birth year]  [facility] CM/SW Contact:  Kermit Balo, RN Phone Number: 07/12/2020, 4:40 PM   Clinical Narrative:    Patient discharging home with outpatient therapy arranged and on AVS.  Medications for d/c sent to [facility] pharmacy. They will be closed at 5 pm. Pt aware and will have them picked up in am. Pt has had all meds today.  3 in 1 for home to be delivered to the room per [facility]. Pt has transportation home.   Final next level of care: OP Rehab Barriers to Discharge: Inadequate or no insurance,Barriers Unresolved (comment)   Patient Goals and CMS Choice     Choice offered to / list presented to : Patient  Discharge Placement                       Discharge Plan and Services   Discharge Planning Services: CM Consult                                 Social Determinants of Health (SDOH) Interventions     Readmission Risk Interventions No flowsheet data found.

## 2020-07-12 NOTE — Discharge Summary (Signed)
Physician Discharge Summary  Robert [name] WTU:[id number] DOB: Sep 23, 1965  PCP: Robert [name]  Admitted from: Home Discharged to: Home  Admit date: 07/09/2020 Discharge date: 07/12/2020  Recommendations for Outpatient Follow-up:    Follow-up Information    [name], Robert C, DO Follow up in 4 week(s).   Contact information: [address] [place] [address] [phone]        Robert [name] Follow up.   Specialty: Rehabilitation Why: the outpatient therapy will contact you for the first appointment Contact information: [address] [id number] mc [place] [address] [phone]       Robert [name] Follow up on 08/17/2020.   Specialty: Family Medicine Why: Your appointment time is at 1:30 pm. please arrive early and bring: picture ID, current meds.  To be seen with repeat labs (CBC & BMP). Contact information: Robert [name] [address] [phone]       Robert [name]. Schedule an appointment as soon as possible for a visit.   Why: MDs office will call you to arrange heart monitor.  Please call them back if you do not hear from them in 2-3 business days. Contact information: [address] [place] [address] [phone]               Home Health: Outpatient PT, OT and SLP.    Equipment/Devices:     Horticulturist, commercial  (From admission, onward)         Start     Ordered   07/11/20 1457  For home use only DME 3 n 1  Once        07/11/20 1457           Discharge Condition: Improved and stable.   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  This has been intermittent over several months.  He also reported that occasionally he is noticing numbness in his left side, slurred speech and facial droop.  This comes and goes.  Symptoms have gotten significantly worse with more weakness in his left hand 5 to 6 days prior to admission.  He is left-handed and realized that he is unable to write any more and came to the hospital.  He was admitted for acute stroke.  Neurology consulted.   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  There are also small infarct in the right cerebellum.  CT angiogram showed abrupt occlusion of right ICA just distal to the bifurcation. He was initially placed on dual antiplatelet therapy with aspirin and Plavix however on the MRI it was also evident some additional petechial hemorrhage in the posterior  right MCA territory and small volume intraventricular hemorrhage in the fourth ventricle which was not not apparent in the original CT.  His dual antiplatelet therapy was then held  -Neurology recommended repeat CT head on 5/4 -2D echo showed EF 55-60%, [name] WMA, RV normal. -LDL 118, A1c 4.5 -Neurology consulted and assisted with evaluation and management.  They opined that he had multifocal right MCA and watershed territory infarct, embolic, likely secondary to right ICA occlusion.  The punctate right cerebellum infarct could be synchronized small vessel etiology given uncontrolled risk  factors, however, cardioembolic source still on differentials. Dr. Roda [name], Neurology called me reporting stable updated CT head today(formal report is pending), advised 30-day heart monitor, tobacco cessation, cleared for DC home on aspirin 81 mg daily and outpatient follow-up with neurology.  Homocystine mildly elevated, Dr. Roda [name] suggested that patients with tobacco abuse can have this, did not recommend anything more than tobacco cessation.  I contacted [name] card master for 30-day event monitoring as outpatient to rule out A. fib and their office will arrange.  Hypercoagulable panel including vitamin B12, anticardiolipin antibodies, lupus anticoagulant, beta-2 glycoprotein antibodies, ANA and RPR were negative.  Minimally elevated homocysteine to be managed with tobacco cessation alone per neurology. TSH normal.  UDS negative.  Discussed with TOC who have arranged for outpatient PT, OT, SLP and 3 and 1 will be given to patient prior to DC.  As per neurology, small IVH, incidental finding, could be related to small vessel source.  Management as noted above.  Active Problems Essential hypertension-BP goal <160 given IVH.  Continue prior home dose of Norvasc and Zestoretic.  Hyperlipidemia LDL 118, goal <70.  Newly on atorvastatin 40 mg daily.  Recommend repeating fasting lipids and LFTs in 4 to 6 weeks time.  Tobacco use-cessation recommended.  1 pack/day for 40 years.  Tobacco cessation counseled.  Continue nicotine patch.  Cervical stenosis C5-7 with severe foraminal stenosis bilaterally-as per neurosurgery, [name] acute neurosurgical intervention for cervical stenosis given acute stroke and recommend outpatient follow-up.  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  As per neurology, [name] intervention needed at this time.  Also starting aspirin 81 mg daily at discharge.   Consultations:  Neurology  Neurosurgery  Procedures:  None   Discharge  Instructions  Discharge Instructions    Ambulatory referral to Neurology   Complete by: As directed    An appointment is requested in approximately: 2 weeks   Ambulatory referral to Occupational Therapy   Complete by: As directed    Ambulatory referral to Physical Therapy   Complete by: As directed    Ambulatory referral to Speech Therapy   Complete by: As directed    Call MD for:   Complete by: As directed    Recurrent strokelike symptoms.   Call MD for:  extreme fatigue   Complete by: As directed    Call MD for:  persistant dizziness or light-headedness   Complete by: As directed    Call MD for:  persistant nausea and vomiting   Complete by: As directed    Call MD for:  severe uncontrolled pain   Complete by: As directed    Diet - low sodium heart healthy   Complete by: As directed    Increase activity slowly   Complete by: As directed        Medication List    TAKE these medications   amLODipine 10 MG tablet Commonly known as: NORVASC TAKE 1  TABLET BY MOUTH DAILY. What changed: Another medication with the same name was removed. Continue taking this medication, and follow the directions you see here.   aspirin EC 81 MG tablet Take 1 tablet (81 mg total) by mouth daily. Swallow whole.   atorvastatin 40 MG tablet Commonly known as: LIPITOR Take 1 tablet (40 mg total) by mouth daily. Start taking on: Jul 13, 2020   gabapentin 800 MG tablet Commonly known as: NEURONTIN TAKE 1 TABLET (800 MG TOTAL) BY MOUTH 2 (TWO) TIMES DAILY. What changed:   how much to take  how to take this  when to take this  Another medication with the same name was removed. Continue taking this medication, and follow the directions you see here.   lisinopril-hydrochlorothiazide 20-12.5 MG tablet Commonly known as: ZESTORETIC Take 1 tablet by mouth daily. What changed: Another medication with the same name was removed. Continue taking this medication, and follow the directions you see  here.   nicotine 14 mg/24hr patch Commonly known as: NICODERM CQ - dosed in mg/24 hours Place 1 patch (14 mg total) onto the skin daily. Start taking on: Jul 13, 2020   Premium Automatic BP Monitor Devi 1 Device by Does not apply route daily.      [name] Known Allergies    Procedures/Studies: CT ANGIO HEAD W OR WO CONTRAST  Result Date: 07/09/2020 CLINICAL DATA:  Initial evaluation for acute stroke. EXAM: CT ANGIOGRAPHY HEAD AND NECK TECHNIQUE: Multidetector CT imaging of the head and neck was performed using the standard protocol during bolus administration of intravenous contrast. Multiplanar CT image reconstructions and MIPs were obtained to evaluate the vascular anatomy. Carotid stenosis measurements (when applicable) are obtained utilizing NASCET criteria, using the distal internal carotid diameter as the denominator. CONTRAST:  78mL OMNIPAQUE IOHEXOL 350 MG/ML SOLN COMPARISON:  Prior head CT from earlier the same day. FINDINGS: CTA NECK FINDINGS Aortic arch: Examination somewhat technically limited by timing of the contrast bolus. Visualized aortic arch normal in caliber with normal 3 vessel morphology. [name] hemodynamically significant stenosis seen about the origin of the great vessels. Mild eccentric soft plaque noted within the distal aspect of the aortic arch itself. Right carotid system: Right CCA patent from its origin to the bifurcation without stenosis. There is abrupt occlusion of the right ICA just distal to the bifurcation (series 7, image 202). Right ICA remains occluded within the neck. Left carotid system: Left common carotid artery patent from its origin to the bifurcation without stenosis. Mild atheromatous irregularity about the left carotid bulb/proximal left ICA without significant stenosis. Left ICA patent distally without stenosis, dissection or occlusion. Vertebral arteries: Both vertebral arteries arise from the subclavian arteries. [name] proximal subclavian artery stenosis.  Vertebral arteries patent without stenosis, dissection or occlusion. Skeleton: [name] visible acute osseous abnormality. [name] discrete or worrisome osseous lesions. Other neck: [name] other acute soft tissue abnormality within the neck. [name] mass or adenopathy. Upper chest: Visualized upper chest demonstrates [name] acute finding. Hypodensity noted within the distal left main pulmonary artery (series 5, image 180), favored to be artifactual and/or mixing artifact. Review of the MIP images confirms the above findings CTA HEAD FINDINGS Anterior circulation: Left internal carotid artery widely patent to the terminus without stenosis. Right ICA occluded at the skull base. Distal reconstitution at the supraclinoid segment via collateral flow across the circle-of-Willis. Left A1 segment widely patent. There is a focal severe distal right A1 stenosis (series 8, image 113). Normal anterior communicating artery complex. Anterior cerebral arteries  remain well perfused to their distal aspects. Mildly attenuated flow within the right MCA as compared to the left. Right M1 segment remains perfused without stenosis. Normal right MCA bifurcation. Distal right MCA branches well perfused without visible downstream occlusion. Left M1 segment widely patent. Normal left MCA bifurcation. Distal left MCA branches well perfused. Posterior circulation: Both V4 segments patent to the vertebrobasilar junction without stenosis. Neither PICA origin well visualized. Focal hypodensity/irregularity present at the level of the vertebrobasilar junction (series 7, image 138). There is a suspected short-segment fenestration at this level, with possible underlying severe stenosis and/or artifact. A small amount of intraluminal thrombus somewhat difficult to exclude. The basilar artery is diminutive but patent distally. Superior cerebellar arteries grossly patent proximally. Both PCAs primarily supplied via the basilar. PCAs are perfused to their distal aspects without  appreciable stenosis. Venous sinuses: Grossly patent allowing for timing the contrast bolus, although evaluation fairly limited. Anatomic variants: None significant. Review of the MIP images confirms the above findings IMPRESSION: 1. 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 Distal reconstitution at the supraclinoid segment via collateral flow across the circle-of-Willis. Attenuated but patent flow within the right MCA distribution, with [name] visible downstream occlusion. 2. Focal hypodensity/irregularity at the level of the vertebrobasilar junction, indeterminate, but favored to reflect a short-segment fenestration with artifact or possible stenosis at this level. A small amount of intraluminal thrombus difficult to exclude, but felt to be less likely. If clinical symptoms should warrant, a repeat examination could be performed for further evaluation as needed. 3. Severe distal right A1 stenosis. Critical Value/emergent results were called by telephone at the time of interpretation on 07/09/2020 at approximately 10:45 pm to provider [name] , who verbally acknowledged these results. Electronically Signed   By: Rise Mu M.D.   On: 07/09/2020 23:41   CT HEAD WO CONTRAST  Result Date: 07/10/2020 CLINICAL DATA:  Arm pain. Left-sided numbness. Right MCA territory infarction. EXAM: CT HEAD WITHOUT CONTRAST TECHNIQUE: Contiguous axial images were obtained from the base of the skull through the vertex without intravenous contrast. COMPARISON:  MRI earlier same day.  CT studies yesterday. FINDINGS: Brain: Blood is visible within the fourth ventricle, as seen on previous MRI but not present on the head CT of yesterday. The etiology of this hemorrhage is not clear. Small vessel acute infarction in the right cerebellum appears the same. Scattered foci cortical low-density in the right middle cerebral artery territory, most notable in the right frontal operculum, consistent with the  previously seen right MCA territory infarctions. [name] evidence of progression. [name] evidence of mass effect or hemorrhage within the right MCA territory infarctions. [name] hydrocephalus or extra-axial collection. Vascular: [name] abnormal vascular finding. Skull: Normal Sinuses/Orbits: [name] sinus inflammatory disease.  Orbits negative. Other: None IMPRESSION: Areas of acute infarction in the right MCA territory, most notable in the frontal operculum, appear stable the yesterday's CT. Small right cerebellar acute infarction. Hemorrhage in the fourth ventricle appears similar to the MRI of earlier today, but was not present on the CT of yesterday. The etiology of this hemorrhage is unclear. Electronically Signed   By: Paulina Fusi M.D.   On: 07/10/2020 12:49   CT HEAD WO CONTRAST  Result Date: 07/09/2020 CLINICAL DATA:  Initial evaluation for acute mental status change, stroke suspected. Left-sided numbness. EXAM: CT HEAD WITHOUT CONTRAST TECHNIQUE: Contiguous axial images were obtained from the base of the skull through the vertex without intravenous contrast. COMPARISON:  Prior CT from 03/07/2020.  FINDINGS: Brain: Cerebral volume within normal limits for age. Subtle hypodensity seen involving the mid and posterior right frontoparietal region, concerning for acute right MCA distribution infarct. Mild patchy involvement of the right insula. [name] intracranial hemorrhage. [name] other acute large vessel territory infarct. [name] mass lesion, midline shift or mass effect. [name] hydrocephalus or extra-axial fluid collection. Vascular: [name] visible hyperdense vessel. Skull: Scalp soft tissues and calvarium within normal limits. Sinuses/Orbits: Globes and orbital soft tissues demonstrate [name] acute finding. Question mild proptosis. Few small retention cyst noted within the left maxillary sinus. Paranasal sinuses are otherwise clear. [name] mastoid effusion. Other: None. IMPRESSION: 1. Subtle hypodensity involving the mid and posterior right  frontoparietal region, concerning for acute right MCA distribution infarct. [name] intracranial hemorrhage. 2. Question mild proptosis. Correlation with physical exam recommended. Results communicated by telephone at the time of interpretation on 07/09/2020 at 9:38 pm to provider [facility] , who verbally acknowledged these results. Electronically Signed   By: Rise MuBenjamin  McClintock M.D.   On: 07/09/2020 21:39   CT ANGIO NECK W OR WO CONTRAST  Result Date: 07/09/2020 CLINICAL DATA:  Initial evaluation for acute stroke. EXAM: CT ANGIOGRAPHY HEAD AND NECK TECHNIQUE: Multidetector CT imaging of the head and neck was performed using the standard protocol during bolus administration of intravenous contrast. Multiplanar CT image reconstructions and MIPs were obtained to evaluate the vascular anatomy. Carotid stenosis measurements (when applicable) are obtained utilizing NASCET criteria, using the distal internal carotid diameter as the denominator. CONTRAST:  50mL OMNIPAQUE IOHEXOL 350 MG/ML SOLN COMPARISON:  Prior head CT from earlier the same day. FINDINGS: CTA NECK FINDINGS Aortic arch: Examination somewhat technically limited by timing of the contrast bolus. Visualized aortic arch normal in caliber with normal 3 vessel morphology. [name] hemodynamically significant stenosis seen about the origin of the great vessels. Mild eccentric soft plaque noted within the distal aspect of the aortic arch itself. Right carotid system: Right CCA patent from its origin to the bifurcation without stenosis. There is abrupt occlusion of the right ICA just distal to the bifurcation (series 7, image 202). Right ICA remains occluded within the neck. Left carotid system: Left common carotid artery patent from its origin to the bifurcation without stenosis. Mild atheromatous irregularity about the left carotid bulb/proximal left ICA without significant stenosis. Left ICA patent distally without stenosis, dissection or occlusion. Vertebral arteries:  Both vertebral arteries arise from the subclavian arteries. [name] proximal subclavian artery stenosis. Vertebral arteries patent without stenosis, dissection or occlusion. Skeleton: [name] visible acute osseous abnormality. [name] discrete or worrisome osseous lesions. Other neck: [name] other acute soft tissue abnormality within the neck. [name] mass or adenopathy. Upper chest: Visualized upper chest demonstrates [name] acute finding. Hypodensity noted within the distal left main pulmonary artery (series 5, image 180), favored to be artifactual and/or mixing artifact. Review of the MIP images confirms the above findings CTA HEAD FINDINGS Anterior circulation: Left internal carotid artery widely patent to the terminus without stenosis. Right ICA occluded at the skull base. Distal reconstitution at the supraclinoid segment via collateral flow across the circle-of-Willis. Left A1 segment widely patent. There is a focal severe distal right A1 stenosis (series 8, image 113). Normal anterior communicating artery complex. Anterior cerebral arteries remain well perfused to their distal aspects. Mildly attenuated flow within the right MCA as compared to the left. Right M1 segment remains perfused without stenosis. Normal right MCA bifurcation. Distal right MCA branches well perfused without visible downstream occlusion. Left M1 segment widely patent. Normal left MCA  bifurcation. Distal left MCA branches well perfused. Posterior circulation: Both V4 segments patent to the vertebrobasilar junction without stenosis. Neither PICA origin well visualized. Focal hypodensity/irregularity present at the level of the vertebrobasilar junction (series 7, image 138). There is a suspected short-segment fenestration at this level, with possible underlying severe stenosis and/or artifact. A small amount of intraluminal thrombus somewhat difficult to exclude. The basilar artery is diminutive but patent distally. Superior cerebellar arteries grossly patent  proximally. Both PCAs primarily supplied via the basilar. PCAs are perfused to their distal aspects without appreciable stenosis. Venous sinuses: Grossly patent allowing for timing the contrast bolus, although evaluation fairly limited. Anatomic variants: None significant. Review of the MIP images confirms the above findings IMPRESSION: 1. 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 Distal reconstitution at the supraclinoid segment via collateral flow across the circle-of-Willis. Attenuated but patent flow within the right MCA distribution, with [name] visible downstream occlusion. 2. Focal hypodensity/irregularity at the level of the vertebrobasilar junction, indeterminate, but favored to reflect a short-segment fenestration with artifact or possible stenosis at this level. A small amount of intraluminal thrombus difficult to exclude, but felt to be less likely. If clinical symptoms should warrant, a repeat examination could be performed for further evaluation as needed. 3. Severe distal right A1 stenosis. Critical Value/emergent results were called by telephone at the time of interpretation on 07/09/2020 at approximately 10:45 pm to provider [facility] , who verbally acknowledged these results. Electronically Signed   By: Rise Mu M.D.   On: 07/09/2020 23:41   MR BRAIN WO CONTRAST  Addendum Date: 07/10/2020   ADDENDUM REPORT: 07/10/2020 05:54 ADDENDUM: This study was discussed by telephone with Dr. Erick Blinks on 07/10/2020 at 0544 hours. Electronically Signed   By: Odessa Fleming M.D.   On: 07/10/2020 05:54   Result Date: 07/10/2020 CLINICAL DATA:  55 year old male with left side numbness. 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 [name] tPA/thrombectomy. EXAM: MRI HEAD WITHOUT CONTRAST TECHNIQUE: Multiplanar, multiecho pulse sequences of the brain and surrounding structures were obtained without  intravenous contrast. COMPARISON:  CT head, CTA head and neck yesterday. FINDINGS: Brain: Widespread patchy and scattered cortical and subcortical white matter restricted diffusion in the right MCA territory mostly affecting the right middle frontal gyrus and the mid right parietal lobe. Confluent involvement at the right frontal operculum corresponding to the CT finding on series 5, image 87. The deep white matter and deep gray nuclei are relatively spared. There are scattered small infarcts also in the right occipital lobe laterally. Cytotoxic edema. Petechial hemorrhage on series 14, image 43. Additionally there are several small linear foci of restricted diffusion in the right cerebellum (series 5, image 65). [name] other posterior fossa restricted diffusion. [name] left cerebral hemisphere restricted diffusion. Furthermore, there is a small volume of intraventricular hemorrhage in the 4th ventricle, not apparent on the CT yesterday. Chronic microhemorrhage in the left parietal lobe. [name] other IVH. [name] midline shift, mass effect, evidence of mass lesion, ventriculomegaly, or other hemorrhage. Cervicomedullary junction and pituitary are within normal limits. [name] significant signal abnormality in the brain parenchyma beyond the above findings. Vascular: Loss of the right ICA flow void in the neck and through the anterior genu with some reconstitution at the terminus. There is a right MCA M1 flow void. Other major intracranial vascular flow voids are preserved. Skull and upper cervical spine: Cervical spine is detailed separately  today. Skull bone marrow signal is within normal limits. Sinuses/Orbits: Orbit motion artifact. [name] orbits soft tissue abnormality identified. Mucous retention cyst left maxillary sinus and mild or trace sinus mucosal thickening elsewhere. Other: Trace left mastoid effusion. Grossly normal visible internal auditory structures. Negative visible scalp and face. IMPRESSION: 1. Abnormal right ICA as seen  by CTA with widely scattered generally small cortical and subcortical white matter infarcts in the right MCA territory. Some involvement of the lateral right PCA/watershed area. Most confluent involvement in the right frontal operculum corresponds to the CT abnormality. 2. Small acute infarct also in the right cerebellum. 3. There is both petechial hemorrhage in the posterior right MCA territory as well as a small volume of Intraventricular Hemorrhage in the 4th ventricle, which was not apparent by CT yesterday. But [name] other IVH or extra-axial blood is identified. [name] significant intracranial mass effect. 4. Generally negative for age MRI appearance of the brain elsewhere. Cervical Spine MRI is reported separately. Electronically Signed: By: Odessa Fleming M.D. On: 07/10/2020 05:41   MR CERVICAL SPINE WO CONTRAST  Result Date: 07/10/2020 CLINICAL DATA:  55 year old male with left side numbness. 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 [name] tPA/thrombectomy. Neck pain. EXAM: MRI CERVICAL SPINE WITHOUT CONTRAST TECHNIQUE: Multiplanar, multisequence MR imaging of the cervical spine was performed. [name] intravenous contrast was administered. COMPARISON:  [facility] cervical spine CT 03/07/2020. Brain MRI today reported separately.  CTA yesterday. FINDINGS: Alignment: Mild straightening of cervical lordosis compared to the December CT. [name] significant spondylolisthesis. Vertebrae: Faint degenerative appearing endplate marrow edema at C5-C6. Background heterogeneous bone marrow signal, although more normal marrow signal at the skull base. Cord: Suboptimal cord detail on axial images today due to motion. 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 Above and below that level [name] definite  cord signal abnormality. Posterior Fossa, vertebral arteries, paraspinal tissues: Cervicomedullary junction is within normal limits. See brain findings today reported separately. Abnormal right ICA flow void in the neck just distal to the bifurcation. Other major arterial flow voids in the neck appear preserved and this is concordant with the CTA yesterday. Otherwise negative visible neck soft tissues, lung apices. Disc levels: C2-C3: Mild circumferential disc bulge and endplate spurring. [name] spinal stenosis. Mild to moderate C3 foraminal stenosis. C3-C4: Disc space loss with circumferential disc bulge and endplate spurring eccentric to the left. Mild spinal stenosis. [name] definite cord mass effect. Moderate to severe left greater than right C4 foraminal stenosis. C4-C5: Circumferential disc bulging with mild endplate spurring. [name] stenosis. C5-C6: More lobulated disc osteophyte complex eccentric to the left and best seen on series 15, image 20. Spinal stenosis with mild left hemi cord mass effect. Moderate left and severe right C6 foraminal stenosis. C6-C7: Circumferential disc osteophyte complex with broad-based posterior component. Mild spinal stenosis. Mild if any cord mass effect. Severe bilateral C7 foraminal stenosis. C7-T1: Mild facet hypertrophy greater on the right. Mild left and mild to moderate right C8 foraminal stenosis. Visible upper thoracic spine degeneration but [name] upper thoracic spinal stenosis. IMPRESSION: 1. Multilevel cervical disc and endplate degeneration. Multilevel mild spinal stenosis with mild cord mass effect at 1 or more levels. 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 2. [name] other spinal  cord signal abnormality identified. Moderate to severe neural foraminal stenosis also at the bilateral C4, C6, and right C8 nerve levels. 3. 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 Preliminary report of the above  discussed by telephone with Dr. Erick Blinks on 07/10/2020 at 0544 hours. Electronically Signed   By: Odessa Fleming M.D.   On: 07/10/2020 05:53   ECHOCARDIOGRAM COMPLETE BUBBLE STUDY  Result Date: 07/10/2020    ECHOCARDIOGRAM REPORT   Patient Name:   VENICE LIZ Date of Exam: 07/10/2020 Medical Rec #:  [id number]     Height:       72.0 in Accession #:    [id number]    Weight:       170.0 lb Date of Birth:  06/01/1965     BSA:          1.988 m Patient Age:    54 years      BP:           144/81 mmHg Patient Gender: M             HR:           55 bpm. Exam Location:  Inpatient Procedure: 2D Echo, Cardiac Doppler, Color Doppler and Saline Contrast Bubble            Study Indications:    Stroke  History:        Patient has [name] prior history of Echocardiogram examinations.                 Risk Factors:Hypertension and Current Smoker.  Sonographer:    Shirlean Kelly Referring Phys: [id number] [facility] IMPRESSIONS  1. Left ventricular ejection fraction, by estimation, is 55 to 60%. The left ventricle has normal function. The left ventricle has [name] regional wall motion abnormalities. Left ventricular diastolic parameters are consistent with Grade I diastolic dysfunction (impaired relaxation).  2. Right ventricular systolic function is normal. The right ventricular size is normal. There is normal pulmonary artery systolic pressure. The estimated right ventricular systolic pressure is 27.2 mmHg.  3. Negative bubble study, [name] evidence for PFO or ASD.  4. The mitral valve is normal in structure. Trivial mitral valve regurgitation. [name] evidence of mitral stenosis.  5. The aortic valve is tricuspid. Aortic valve regurgitation is not visualized. [name] aortic stenosis is present.  6. The inferior vena cava is normal in size with greater than 50% respiratory variability, suggesting right atrial pressure of 3 mmHg. FINDINGS  Left Ventricle: Left ventricular ejection fraction, by estimation, is 55 to 60%. The left ventricle has  normal function. The left ventricle has [name] regional wall motion abnormalities. The left ventricular internal cavity size was normal in size. There is  [name] left ventricular hypertrophy. Left ventricular diastolic parameters are consistent with Grade I diastolic dysfunction (impaired relaxation). Right Ventricle: The right ventricular size is normal. [name] increase in right ventricular wall thickness. Right ventricular systolic function is normal. There is normal pulmonary artery systolic pressure. The tricuspid regurgitant velocity is 2.46 m/s, and  with an assumed right atrial pressure of 3 mmHg, the estimated right ventricular systolic pressure is 27.2 mmHg. Left Atrium: Left atrial size was normal in size. Right Atrium: Right atrial size was normal in size. Pericardium: There is [name] evidence of pericardial effusion. Mitral Valve: The mitral valve is normal in structure. Trivial mitral valve regurgitation. [name] evidence of mitral valve stenosis. Tricuspid Valve: The tricuspid valve is normal in structure. Tricuspid valve regurgitation is trivial. Aortic Valve: The  aortic valve is tricuspid. Aortic valve regurgitation is not visualized. [name] aortic stenosis is present. Aortic valve mean gradient measures 6.0 mmHg. Aortic valve peak gradient measures 11.3 mmHg. Aortic valve area, by VTI measures 2.46  cm. Pulmonic Valve: The pulmonic valve was normal in structure. Pulmonic valve regurgitation is trivial. Aorta: The aortic root is normal in size and structure. Venous: The inferior vena cava is normal in size with greater than 50% respiratory variability, suggesting right atrial pressure of 3 mmHg. IAS/Shunts: Negative bubble study, [name] evidence for PFO or ASD. Agitated saline contrast was given intravenously to evaluate for intracardiac shunting.  LEFT VENTRICLE PLAX 2D LVIDd:         4.60 cm  Diastology LVIDs:         3.00 cm  LV e' medial:    6.42 cm/s LV PW:         1.20 cm  LV E/e' medial:  8.6 LV IVS:        1.10 cm  LV  e' lateral:   10.90 cm/s LVOT diam:     2.20 cm  LV E/e' lateral: 5.1 LV SV:         83 LV SV Index:   42 LVOT Area:     3.80 cm  RIGHT VENTRICLE             IVC RV Basal diam:  3.60 cm     IVC diam: 1.20 cm RV S prime:     13.70 cm/s TAPSE (M-mode): 2.4 cm LEFT ATRIUM             Index       RIGHT ATRIUM           Index LA diam:        3.20 cm 1.61 cm/m  RA Area:     14.50 cm LA Vol (A2C):   48.7 ml 24.49 ml/m RA Volume:   35.60 ml  17.90 ml/m LA Vol (A4C):   39.8 ml 20.02 ml/m LA Biplane Vol: 44.8 ml 22.53 ml/m  AORTIC VALVE AV Area (Vmax):    2.90 cm AV Area (Vmean):   2.59 cm AV Area (VTI):     2.46 cm AV Vmax:           168.00 cm/s AV Vmean:          119.000 cm/s AV VTI:            0.339 m AV Peak Grad:      11.3 mmHg AV Mean Grad:      6.0 mmHg LVOT Vmax:         128.00 cm/s LVOT Vmean:        81.000 cm/s LVOT VTI:          0.219 m LVOT/AV VTI ratio: 0.65  AORTA Ao Root diam: 3.30 cm Ao Asc diam:  3.30 cm MITRAL VALVE               TRICUSPID VALVE MV Area (PHT): 3.21 cm    TR Peak grad:   24.2 mmHg MV Decel Time: 236 msec    TR Vmax:        246.00 cm/s MV E velocity: 55.30 cm/s MV A velocity: 68.40 cm/s  SHUNTS MV E/A ratio:  0.81        Systemic VTI:  0.22 m  Systemic Diam: 2.20 cm Marca Ancona MD Electronically signed by Marca Ancona MD Signature Date/Time: 07/10/2020/1:32:21 PM    Final        Subjective: Left hand numbness, ongoing left upper extremity weakness.  Denies weakness elsewhere.  [name] slurred speech or facial lean.  Discharge Exam:  Vitals:   07/12/20 0406 07/12/20 0713 07/12/20 1125 07/12/20 1505  BP: 136/85 (!) 133/92 127/75 (!) 127/91  Pulse: (!) 59 (!) 57 (!) 58 65  Resp: 17 16 16 16   Temp: 98 F (36.7 C) 98.4 F (36.9 C) 98.2 F (36.8 C) 97.6 F (36.4 C)  TempSrc: Oral Oral Oral Oral  SpO2: 100% 100% 99% 100%    General: Pleasant young male, moderately built and nourished, seen ambulating comfortably in the room and brushing his  teeth with his nondominant right upper extremity. Cardiovascular: S1 & S2 heard, RRR, S1/S2 +. [name] murmurs, rubs, gallops or clicks. [name] JVD or pedal edema.   Respiratory: Clear to auscultation without wheezing, rhonchi or crackles. [name] increased work of breathing. Abdominal:  Non distended, non tender & soft. [name] organomegaly or masses appreciated. Normal bowel sounds heard. CNS: Alert and oriented. [name] cranial nerve deficits.  Left upper extremity grade 4 x 5 power including grip. Extremities: [name] edema, [name] cyanosis    The results of significant diagnostics from this hospitalization (including imaging, microbiology, ancillary and laboratory) are listed below for reference.     Microbiology: Recent Results (from the past 240 hour(s))  Resp Panel by RT-PCR (Flu A&B, Covid) Nasopharyngeal Swab     Status: None   Collection Time: 07/10/20 12:40 AM   Specimen: Nasopharyngeal Swab; Nasopharyngeal(NP) swabs in vial transport medium  Result Value Ref Range Status   SARS Coronavirus 2 by RT PCR NEGATIVE NEGATIVE Final    Comment: (NOTE) SARS-CoV-2 target nucleic acids are NOT DETECTED.  The SARS-CoV-2 RNA is generally detectable in upper respiratory specimens during the acute phase of infection. The lowest concentration of SARS-CoV-2 viral copies this assay can detect is 138 copies/mL. A negative result does not preclude SARS-Cov-2 infection and should not be used as the sole basis for treatment or other patient management decisions. A negative result may occur with  improper specimen collection/handling, submission of specimen other than nasopharyngeal swab, presence of viral mutation(s) within the areas targeted by this assay, and inadequate number of viral copies(<138 copies/mL). A negative result must be combined with clinical observations, patient history, and epidemiological information. The expected result is Negative.  Fact Sheet for Patients:   BloggerCourse.com  Fact Sheet for Healthcare Providers:  SeriousBroker.it  This test is [name] t yet approved or cleared by the [place] FDA and  has been authorized for detection and/or diagnosis of SARS-CoV-2 by FDA under an Emergency Use Authorization (EUA). This EUA will remain  in effect (meaning this test can be used) for the duration of the COVID-19 declaration under Section 564(b)(1) of the Act, 21 U.S.C.section 360bbb-3(b)(1), unless the authorization is terminated  or revoked sooner.       Influenza A by PCR NEGATIVE NEGATIVE Final   Influenza B by PCR NEGATIVE NEGATIVE Final    Comment: (NOTE) The Xpert Xpress SARS-CoV-2/FLU/RSV plus assay is intended as an aid in the diagnosis of influenza from Nasopharyngeal swab specimens and should not be used as a sole basis for treatment. Nasal washings and aspirates are unacceptable for Xpert Xpress SARS-CoV-2/FLU/RSV testing.  Fact Sheet for Patients: BloggerCourse.com  Fact Sheet for Healthcare Providers: SeriousBroker.it  This test is  not yet approved or cleared by the [place] and has been authorized for detection and/or diagnosis of SARS-CoV-2 by FDA under an Emergency Use Authorization (EUA). This EUA will remain in effect (meaning this test can be used) for the duration of the COVID-19 declaration under Section 564(b)(1) of the Act, 21 U.S.C. section 360bbb-3(b)(1), unless the authorization is terminated or revoked.  Performed at [facility], [address] [place], [place] [address]      Labs: CBC: Recent Labs  Lab 07/09/20 2023 07/10/20 0301 07/12/20 0415  WBC 7.8 7.0 6.1  NEUTROABS 5.6  --   --   HGB 13.6 12.5* 13.6  HCT 40.4 37.6* 39.4  MCV 94.4 94.5 91.2  PLT 231 214 219    Basic Metabolic Panel: Recent Labs  Lab 07/09/20 2023 07/10/20 0301 07/12/20 0415  NA 138 138 135  K  3.3* 3.9 3.6  CL 102 105 105  CO2 GLUCOSE 134* 81 104*  BUN CREATININE 1.28* 1.17 1.20  CALCIUM 8.9 8.6* 9.0    Liver Function Tests: Recent Labs  Lab 07/09/20 2023  AST 28  ALT 16  ALKPHOS 107  BILITOT 0.8  PROT 7.0  ALBUMIN 3.6    CBG: Recent Labs  Lab 07/09/20 2000 07/11/20 1618  GLUCAP 168* 107*    Hgb A1c Recent Labs    07/10/20 0019  HGBA1C 4.5*    Lipid Profile Recent Labs    07/10/20 0019  CHOL 185  HDL 43  LDLCALC 118*  TRIG 120  CHOLHDL 4.3    Thyroid function studies Recent Labs    07/10/20 0927  TSH 0.552    Anemia work up Recent Labs    07/10/20 0927  VITAMINB12 217    Urinalysis    Component Value Date/Time   COLORURINE COLORLESS (A) 07/10/2020 2108   APPEARANCEUR CLEAR 07/10/2020 2108   LABSPEC 1.005 07/10/2020 2108   PHURINE 8.0 07/10/2020 2108   GLUCOSEU NEGATIVE 07/10/2020 2108   HGBUR SMALL (A) 07/10/2020 2108   BILIRUBINUR NEGATIVE 07/10/2020 2108   BILIRUBINUR small 11/04/2014 1923   KETONESUR NEGATIVE 07/10/2020 2108   PROTEINUR NEGATIVE 07/10/2020 2108   UROBILINOGEN 1.0 11/04/2014 1923   NITRITE NEGATIVE 07/10/2020 2108   LEUKOCYTESUR NEGATIVE 07/10/2020 2108      Time coordinating discharge: 35 minutes  SIGNED:  Marcellus Scott, MD, FACP, [facility]. [facility]  To contact the attending provider between 7A-7P or the covering provider during after hours 7P-7A, please log into the web site [email or web address] and access using universal [place] password for that web site. If you do not have the password, please call the hospital operator.

## 2020-07-12 NOTE — Progress Notes (Signed)
STROKE TEAM PROGRESS NOTE   SUBJECTIVE (INTERVAL HISTORY) No family at the bedside. Pt sitting in chair, no acute event overnight.  Neuro stable.  Repeat CT showed evolving fourth ventricle IVH, unchanged right frontal lobe infarct.  No start aspirin 81 on discharge.  OBJECTIVE Temp:  [97.6 F (36.4 C)-98.4 F (36.9 C)] 97.6 F (36.4 C) (05/04 1505) Pulse Rate:  [57-65] 65 (05/04 1505) Resp:  [16-17] 16 (05/04 1505) BP: (127-142)/(75-100) 127/91 (05/04 1505) SpO2:  [99 %-100 %] 100 % (05/04 1505)  Recent Labs  Lab 07/09/20 2000 07/11/20 1618  GLUCAP 168* 107*   Recent Labs  Lab 07/09/20 2023 07/10/20 0301 07/12/20 0415  NA 138 138 135  K 3.3* 3.9 3.6  CL 102 105 105  CO2 GLUCOSE 134* 81 104*  BUN CREATININE 1.28* 1.17 1.20  CALCIUM 8.9 8.6* 9.0   Recent Labs  Lab 07/09/20 2023  AST 28  ALT 16  ALKPHOS 107  BILITOT 0.8  PROT 7.0  ALBUMIN 3.6   Recent Labs  Lab 07/09/20 2023 07/10/20 0301 07/12/20 0415  WBC 7.8 7.0 6.1  NEUTROABS 5.6  --   --   HGB 13.6 12.5* 13.6  HCT 40.4 37.6* 39.4  MCV 94.4 94.5 91.2  PLT 231 214 219   No results for input(s): CKTOTAL, CKMB, CKMBINDEX, TROPONINI in the last 168 hours. No results for input(s): LABPROT, INR in the last 72 hours. Recent Labs    07/10/20 2108  COLORURINE COLORLESS*  LABSPEC 1.005  PHURINE 8.0  GLUCOSEU NEGATIVE  HGBUR SMALL*  BILIRUBINUR NEGATIVE  KETONESUR NEGATIVE  PROTEINUR NEGATIVE  NITRITE NEGATIVE  LEUKOCYTESUR NEGATIVE       Component Value Date/Time   CHOL 185 07/10/2020 0019   TRIG 120 07/10/2020 0019   HDL 43 07/10/2020 0019   CHOLHDL 4.3 07/10/2020 0019   VLDL 24 07/10/2020 0019   LDLCALC 118 (H) 07/10/2020 0019   Lab Results  Component Value Date   HGBA1C 4.5 (L) 07/10/2020      Component Value Date/Time   LABOPIA NONE DETECTED 07/10/2020 2108   COCAINSCRNUR NONE DETECTED 07/10/2020 2108   LABBENZ NONE DETECTED 07/10/2020 2108   AMPHETMU NONE  DETECTED 07/10/2020 2108   THCU NONE DETECTED 07/10/2020 2108   LABBARB NONE DETECTED 07/10/2020 2108    Recent Labs  Lab 07/09/20 2023  ETH <10    I have personally reviewed the radiological images below and agree with the radiology interpretations.  CT ANGIO HEAD W OR WO CONTRAST  Result Date: 07/09/2020 CLINICAL DATA:  Initial evaluation for acute stroke. EXAM: CT ANGIOGRAPHY HEAD AND NECK TECHNIQUE: Multidetector CT imaging of the head and neck was performed using the standard protocol during bolus administration of intravenous contrast. Multiplanar CT image reconstructions and MIPs were obtained to evaluate the vascular anatomy. Carotid stenosis measurements (when applicable) are obtained utilizing NASCET criteria, using the distal internal carotid diameter as the denominator. CONTRAST:  50mL OMNIPAQUE IOHEXOL 350 MG/ML SOLN COMPARISON:  Prior head CT from earlier the same day. FINDINGS: CTA NECK FINDINGS Aortic arch: Examination somewhat technically limited by timing of the contrast bolus. Visualized aortic arch normal in caliber with normal 3 vessel morphology. No hemodynamically significant stenosis seen about the origin of the great vessels. Mild eccentric soft plaque noted within the distal aspect of the aortic arch itself. Right carotid system: Right CCA patent from its origin to the bifurcation without stenosis. There is abrupt occlusion of the right  ICA just distal to the bifurcation (series 7, image 202). Right ICA remains occluded within the neck. Left carotid system: Left common carotid artery patent from its origin to the bifurcation without stenosis. Mild atheromatous irregularity about the left carotid bulb/proximal left ICA without significant stenosis. Left ICA patent distally without stenosis, dissection or occlusion. Vertebral arteries: Both vertebral arteries arise from the subclavian arteries. No proximal subclavian artery stenosis. Vertebral arteries patent without stenosis,  dissection or occlusion. Skeleton: No visible acute osseous abnormality. No discrete or worrisome osseous lesions. Other neck: No other acute soft tissue abnormality within the neck. No mass or adenopathy. Upper chest: Visualized upper chest demonstrates no acute finding. Hypodensity noted within the distal left main pulmonary artery (series 5, image 180), favored to be artifactual and/or mixing artifact. Review of the MIP images confirms the above findings CTA HEAD FINDINGS Anterior circulation: Left internal carotid artery widely patent to the terminus without stenosis. Right ICA occluded at the skull base. Distal reconstitution at the supraclinoid segment via collateral flow across the circle-of-Willis. Left A1 segment widely patent. There is a focal severe distal right A1 stenosis (series 8, image 113). Normal anterior communicating artery complex. Anterior cerebral arteries remain well perfused to their distal aspects. Mildly attenuated flow within the right MCA as compared to the left. Right M1 segment remains perfused without stenosis. Normal right MCA bifurcation. Distal right MCA branches well perfused without visible downstream occlusion. Left M1 segment widely patent. Normal left MCA bifurcation. Distal left MCA branches well perfused. Posterior circulation: Both V4 segments patent to the vertebrobasilar junction without stenosis. Neither PICA origin well visualized. Focal hypodensity/irregularity present at the level of the vertebrobasilar junction (series 7, image 138). There is a suspected short-segment fenestration at this level, with possible underlying severe stenosis and/or artifact. A small amount of intraluminal thrombus somewhat difficult to exclude. The basilar artery is diminutive but patent distally. Superior cerebellar arteries grossly patent proximally. Both PCAs primarily supplied via the basilar. PCAs are perfused to their distal aspects without appreciable stenosis. Venous sinuses:  Grossly patent allowing for timing the contrast bolus, although evaluation fairly limited. Anatomic variants: None significant. Review of the MIP images confirms the above findings IMPRESSION: 1. 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 Distal reconstitution at the supraclinoid segment via collateral flow across the circle-of-Willis. Attenuated but patent flow within the right MCA distribution, with no visible downstream occlusion. 2. Focal hypodensity/irregularity at the level of the vertebrobasilar junction, indeterminate, but favored to reflect a short-segment fenestration with artifact or possible stenosis at this level. A small amount of intraluminal thrombus difficult to exclude, but felt to be less likely. If clinical symptoms should warrant, a repeat examination could be performed for further evaluation as needed. 3. Severe distal right A1 stenosis. Critical Value/emergent results were called by telephone at the time of interpretation on 07/09/2020 at approximately 10:45 pm to provider [facility] , who verbally acknowledged these results. Electronically Signed   By: Rise Mu M.D.   On: 07/09/2020 23:41   CT HEAD WO CONTRAST  Result Date: 07/12/2020 CLINICAL DATA:  Stroke follow-up EXAM: CT HEAD WITHOUT CONTRAST TECHNIQUE: Contiguous axial images were obtained from the base of the skull through the vertex without intravenous contrast. COMPARISON:  Head CT 07/10/2020 and brain MRI 07/10/2020 FINDINGS: Brain: Unchanged appearance of recent infarct in the subcortical right frontal lobe. No acute hemorrhage. No midline shift or other mass effect. Ventricles are normal. Vascular: No abnormal hyperdensity of the major  intracranial arteries or dural venous sinuses. No intracranial atherosclerosis. Skull: The visualized skull base, calvarium and extracranial soft tissues are normal. Sinuses/Orbits: No fluid levels or advanced mucosal thickening of the visualized paranasal  sinuses. No mastoid or middle ear effusion. The orbits are normal. IMPRESSION: Unchanged appearance of recent infarct in the subcortical right frontal lobe. No acute hemorrhage or mass effect. Electronically Signed   By: Deatra RobinsonKevin  Herman M.D.   On: 07/12/2020 19:35   CT HEAD WO CONTRAST  Result Date: 07/10/2020 CLINICAL DATA:  Arm pain. Left-sided numbness. Right MCA territory infarction. EXAM: CT HEAD WITHOUT CONTRAST TECHNIQUE: Contiguous axial images were obtained from the base of the skull through the vertex without intravenous contrast. COMPARISON:  MRI earlier same day.  CT studies yesterday. FINDINGS: Brain: Blood is visible within the fourth ventricle, as seen on previous MRI but not present on the head CT of yesterday. The etiology of this hemorrhage is not clear. Small vessel acute infarction in the right cerebellum appears the same. Scattered foci cortical low-density in the right middle cerebral artery territory, most notable in the right frontal operculum, consistent with the previously seen right MCA territory infarctions. No evidence of progression. No evidence of mass effect or hemorrhage within the right MCA territory infarctions. No hydrocephalus or extra-axial collection. Vascular: No abnormal vascular finding. Skull: Normal Sinuses/Orbits: No sinus inflammatory disease.  Orbits negative. Other: None IMPRESSION: Areas of acute infarction in the right MCA territory, most notable in the frontal operculum, appear stable the yesterday's CT. Small right cerebellar acute infarction. Hemorrhage in the fourth ventricle appears similar to the MRI of earlier today, but was not present on the CT of yesterday. The etiology of this hemorrhage is unclear. Electronically Signed   By: Paulina FusiMark  Shogry M.D.   On: 07/10/2020 12:49   CT HEAD WO CONTRAST  Result Date: 07/09/2020 CLINICAL DATA:  Initial evaluation for acute mental status change, stroke suspected. Left-sided numbness. EXAM: CT HEAD WITHOUT CONTRAST  TECHNIQUE: Contiguous axial images were obtained from the base of the skull through the vertex without intravenous contrast. COMPARISON:  Prior CT from 03/07/2020. FINDINGS: Brain: Cerebral volume within normal limits for age. Subtle hypodensity seen involving the mid and posterior right frontoparietal region, concerning for acute right MCA distribution infarct. Mild patchy involvement of the right insula. No intracranial hemorrhage. No other acute large vessel territory infarct. No mass lesion, midline shift or mass effect. No hydrocephalus or extra-axial fluid collection. Vascular: No visible hyperdense vessel. Skull: Scalp soft tissues and calvarium within normal limits. Sinuses/Orbits: Globes and orbital soft tissues demonstrate no acute finding. Question mild proptosis. Few small retention cyst noted within the left maxillary sinus. Paranasal sinuses are otherwise clear. No mastoid effusion. Other: None. IMPRESSION: 1. Subtle hypodensity involving the mid and posterior right frontoparietal region, concerning for acute right MCA distribution infarct. No intracranial hemorrhage. 2. Question mild proptosis. Correlation with physical exam recommended. Results communicated by telephone at the time of interpretation on 07/09/2020 at 9:38 pm to [facility] , who verbally acknowledged these results. Electronically Signed   By: Rise MuBenjamin  McClintock M.D.   On: 07/09/2020 21:39   CT ANGIO NECK W OR WO CONTRAST  Result Date: 07/09/2020 CLINICAL DATA:  Initial evaluation for acute stroke. EXAM: CT ANGIOGRAPHY HEAD AND NECK TECHNIQUE: Multidetector CT imaging of the head and neck was performed using the standard protocol during bolus administration of intravenous contrast. Multiplanar CT image reconstructions and MIPs were obtained to evaluate the vascular anatomy. Carotid stenosis measurements (  when applicable) are obtained utilizing NASCET criteria, using the distal internal carotid diameter as the denominator.  CONTRAST:  50mL OMNIPAQUE IOHEXOL 350 MG/ML SOLN COMPARISON:  Prior head CT from earlier the same day. FINDINGS: CTA NECK FINDINGS Aortic arch: Examination somewhat technically limited by timing of the contrast bolus. Visualized aortic arch normal in caliber with normal 3 vessel morphology. No hemodynamically significant stenosis seen about the origin of the great vessels. Mild eccentric soft plaque noted within the distal aspect of the aortic arch itself. Right carotid system: Right CCA patent from its origin to the bifurcation without stenosis. There is abrupt occlusion of the right ICA just distal to the bifurcation (series 7, image 202). Right ICA remains occluded within the neck. Left carotid system: Left common carotid artery patent from its origin to the bifurcation without stenosis. Mild atheromatous irregularity about the left carotid bulb/proximal left ICA without significant stenosis. Left ICA patent distally without stenosis, dissection or occlusion. Vertebral arteries: Both vertebral arteries arise from the subclavian arteries. No proximal subclavian artery stenosis. Vertebral arteries patent without stenosis, dissection or occlusion. Skeleton: No visible acute osseous abnormality. No discrete or worrisome osseous lesions. Other neck: No other acute soft tissue abnormality within the neck. No mass or adenopathy. Upper chest: Visualized upper chest demonstrates no acute finding. Hypodensity noted within the distal left main pulmonary artery (series 5, image 180), favored to be artifactual and/or mixing artifact. Review of the MIP images confirms the above findings CTA HEAD FINDINGS Anterior circulation: Left internal carotid artery widely patent to the terminus without stenosis. Right ICA occluded at the skull base. Distal reconstitution at the supraclinoid segment via collateral flow across the circle-of-Willis. Left A1 segment widely patent. There is a focal severe distal right A1 stenosis (series 8,  image 113). Normal anterior communicating artery complex. Anterior cerebral arteries remain well perfused to their distal aspects. Mildly attenuated flow within the right MCA as compared to the left. Right M1 segment remains perfused without stenosis. Normal right MCA bifurcation. Distal right MCA branches well perfused without visible downstream occlusion. Left M1 segment widely patent. Normal left MCA bifurcation. Distal left MCA branches well perfused. Posterior circulation: Both V4 segments patent to the vertebrobasilar junction without stenosis. Neither PICA origin well visualized. Focal hypodensity/irregularity present at the level of the vertebrobasilar junction (series 7, image 138). There is a suspected short-segment fenestration at this level, with possible underlying severe stenosis and/or artifact. A small amount of intraluminal thrombus somewhat difficult to exclude. The basilar artery is diminutive but patent distally. Superior cerebellar arteries grossly patent proximally. Both PCAs primarily supplied via the basilar. PCAs are perfused to their distal aspects without appreciable stenosis. Venous sinuses: Grossly patent allowing for timing the contrast bolus, although evaluation fairly limited. Anatomic variants: None significant. Review of the MIP images confirms the above findings IMPRESSION: 1. 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 Distal reconstitution at the supraclinoid segment via collateral flow across the circle-of-Willis. Attenuated but patent flow within the right MCA distribution, with no visible downstream occlusion. 2. Focal hypodensity/irregularity at the level of the vertebrobasilar junction, indeterminate, but favored to reflect a short-segment fenestration with artifact or possible stenosis at this level. A small amount of intraluminal thrombus difficult to exclude, but felt to be less likely. If clinical symptoms should warrant, a repeat examination could  be performed for further evaluation as needed. 3. Severe distal right A1 stenosis. Critical Value/emergent results were called by telephone at the time of interpretation on  07/09/2020 at approximately 10:45 pm to provider [facility] , who verbally acknowledged these results. Electronically Signed   By: Rise Mu M.D.   On: 07/09/2020 23:41   MR BRAIN WO CONTRAST  Addendum Date: 07/10/2020   ADDENDUM REPORT: 07/10/2020 05:54 ADDENDUM: This study was discussed by telephone with Dr. Erick Blinks on 07/10/2020 at 0544 hours. Electronically Signed   By: Odessa Fleming M.D.   On: 07/10/2020 05:54   Result Date: 07/10/2020 CLINICAL DATA:  55 year old male with left side numbness. 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 No tPA/thrombectomy. EXAM: MRI HEAD WITHOUT CONTRAST TECHNIQUE: Multiplanar, multiecho pulse sequences of the brain and surrounding structures were obtained without intravenous contrast. COMPARISON:  CT head, CTA head and neck yesterday. FINDINGS: Brain: Widespread patchy and scattered cortical and subcortical white matter restricted diffusion in the right MCA territory mostly affecting the right middle frontal gyrus and the mid right parietal lobe. Confluent involvement at the right frontal operculum corresponding to the CT finding on series 5, image 87. The deep white matter and deep gray nuclei are relatively spared. There are scattered small infarcts also in the right occipital lobe laterally. Cytotoxic edema. Petechial hemorrhage on series 14, image 43. Additionally there are several small linear foci of restricted diffusion in the right cerebellum (series 5, image 65). No other posterior fossa restricted diffusion. No left cerebral hemisphere restricted diffusion. Furthermore, there is a small volume of intraventricular hemorrhage in the 4th ventricle, not apparent on the CT yesterday. Chronic  microhemorrhage in the left parietal lobe. No other IVH. No midline shift, mass effect, evidence of mass lesion, ventriculomegaly, or other hemorrhage. Cervicomedullary junction and pituitary are within normal limits. No significant signal abnormality in the brain parenchyma beyond the above findings. Vascular: Loss of the right ICA flow void in the neck and through the anterior genu with some reconstitution at the terminus. There is a right MCA M1 flow void. Other major intracranial vascular flow voids are preserved. Skull and upper cervical spine: Cervical spine is detailed separately today. Skull bone marrow signal is within normal limits. Sinuses/Orbits: Orbit motion artifact. No orbits soft tissue abnormality identified. Mucous retention cyst left maxillary sinus and mild or trace sinus mucosal thickening elsewhere. Other: Trace left mastoid effusion. Grossly normal visible internal auditory structures. Negative visible scalp and face. IMPRESSION: 1. Abnormal right ICA as seen by CTA with widely scattered generally small cortical and subcortical white matter infarcts in the right MCA territory. Some involvement of the lateral right PCA/watershed area. Most confluent involvement in the right frontal operculum corresponds to the CT abnormality. 2. Small acute infarct also in the right cerebellum. 3. There is both petechial hemorrhage in the posterior right MCA territory as well as a small volume of Intraventricular Hemorrhage in the 4th ventricle, which was not apparent by CT yesterday. But no other IVH or extra-axial blood is identified. No significant intracranial mass effect. 4. Generally negative for age MRI appearance of the brain elsewhere. Cervical Spine MRI is reported separately. Electronically Signed: By: Odessa Fleming M.D. On: 07/10/2020 05:41   MR CERVICAL SPINE WO CONTRAST  Result Date: 07/10/2020 CLINICAL DATA:  55 year old male with left side numbness. Right ICA occlusion with attenuated right MCA and  evidence of acute to subacute right MCA territory infarct on CT/CTA yesterday. No tPA/thrombectomy. Neck pain. EXAM: MRI CERVICAL SPINE WITHOUT CONTRAST TECHNIQUE: Multiplanar, multisequence MR imaging of the cervical spine was  performed. No intravenous contrast was administered. COMPARISON:  [facility] cervical spine CT 03/07/2020. Brain MRI today reported separately.  CTA yesterday. FINDINGS: Alignment: Mild straightening of cervical lordosis compared to the December CT. No significant spondylolisthesis. Vertebrae: Faint degenerative appearing endplate marrow edema at C5-C6. Background heterogeneous bone marrow signal, although more normal marrow signal at the skull base. Cord: Suboptimal cord detail on axial images today due to motion. 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 Above and below that level no definite cord signal abnormality. Posterior Fossa, vertebral arteries, paraspinal tissues: Cervicomedullary junction is within normal limits. See brain findings today reported separately. Abnormal right ICA flow void in the neck just distal to the bifurcation. Other major arterial flow voids in the neck appear preserved and this is concordant with the CTA yesterday. Otherwise negative visible neck soft tissues, lung apices. Disc levels: C2-C3: Mild circumferential disc bulge and endplate spurring. No spinal stenosis. Mild to moderate C3 foraminal stenosis. C3-C4: Disc space loss with circumferential disc bulge and endplate spurring eccentric to the left. Mild spinal stenosis. No definite cord mass effect. Moderate to severe left greater than right C4 foraminal stenosis. C4-C5: Circumferential disc bulging with mild endplate spurring. No stenosis. C5-C6: More lobulated disc osteophyte complex eccentric to the left and best seen on series 15, image  20. Spinal stenosis with mild left hemi cord mass effect. Moderate left and severe right C6 foraminal stenosis. C6-C7: Circumferential disc osteophyte complex with broad-based posterior component. Mild spinal stenosis. Mild if any cord mass effect. Severe bilateral C7 foraminal stenosis. C7-T1: Mild facet hypertrophy greater on the right. Mild left and mild to moderate right C8 foraminal stenosis. Visible upper thoracic spine degeneration but no upper thoracic spinal stenosis. IMPRESSION: 1. Multilevel cervical disc and endplate degeneration. Multilevel mild spinal stenosis with mild cord mass effect at 1 or more levels. 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 2. No other spinal cord signal abnormality identified. Moderate to severe neural foraminal stenosis also at the bilateral C4, C6, and right C8 nerve levels. 3. 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 Preliminary report of the above discussed by telephone with Dr. Erick Blinks on 07/10/2020 at 0544 hours. Electronically Signed   By: Odessa Fleming M.D.   On: 07/10/2020 05:53   ECHOCARDIOGRAM COMPLETE BUBBLE STUDY  Result Date: 07/10/2020    ECHOCARDIOGRAM REPORT   Patient Name:   TREVEON BOURCIER Date of Exam: 07/10/2020 Medical Rec #:  [id number]     Height:       72.0 in Accession #:    [id number]    Weight:       170.0 lb Date of Birth:  05/30/1965     BSA:          1.988 m Patient Age:    54 years      BP:           144/81 mmHg Patient Gender: M             HR:           55 bpm. Exam Location:  Inpatient Procedure: 2D Echo, Cardiac Doppler, Color Doppler and Saline Contrast Bubble            Study Indications:    Stroke  History:  Patient has no prior history of Echocardiogram examinations.                 Risk Factors:Hypertension and Current Smoker.  Sonographer:    Shirlean Kelly Referring Phys: [id number] [facility] IMPRESSIONS  1. Left ventricular ejection  fraction, by estimation, is 55 to 60%. The left ventricle has normal function. The left ventricle has no regional wall motion abnormalities. Left ventricular diastolic parameters are consistent with Grade I diastolic dysfunction (impaired relaxation).  2. Right ventricular systolic function is normal. The right ventricular size is normal. There is normal pulmonary artery systolic pressure. The estimated right ventricular systolic pressure is 27.2 mmHg.  3. Negative bubble study, no evidence for PFO or ASD.  4. The mitral valve is normal in structure. Trivial mitral valve regurgitation. No evidence of mitral stenosis.  5. The aortic valve is tricuspid. Aortic valve regurgitation is not visualized. No aortic stenosis is present.  6. The inferior vena cava is normal in size with greater than 50% respiratory variability, suggesting right atrial pressure of 3 mmHg. FINDINGS  Left Ventricle: Left ventricular ejection fraction, by estimation, is 55 to 60%. The left ventricle has normal function. The left ventricle has no regional wall motion abnormalities. The left ventricular internal cavity size was normal in size. There is  no left ventricular hypertrophy. Left ventricular diastolic parameters are consistent with Grade I diastolic dysfunction (impaired relaxation). Right Ventricle: The right ventricular size is normal. No increase in right ventricular wall thickness. Right ventricular systolic function is normal. There is normal pulmonary artery systolic pressure. The tricuspid regurgitant velocity is 2.46 m/s, and  with an assumed right atrial pressure of 3 mmHg, the estimated right ventricular systolic pressure is 27.2 mmHg. Left Atrium: Left atrial size was normal in size. Right Atrium: Right atrial size was normal in size. Pericardium: There is no evidence of pericardial effusion. Mitral Valve: The mitral valve is normal in structure. Trivial mitral valve regurgitation. No evidence of mitral valve stenosis.  Tricuspid Valve: The tricuspid valve is normal in structure. Tricuspid valve regurgitation is trivial. Aortic Valve: The aortic valve is tricuspid. Aortic valve regurgitation is not visualized. No aortic stenosis is present. Aortic valve mean gradient measures 6.0 mmHg. Aortic valve peak gradient measures 11.3 mmHg. Aortic valve area, by VTI measures 2.46  cm. Pulmonic Valve: The pulmonic valve was normal in structure. Pulmonic valve regurgitation is trivial. Aorta: The aortic root is normal in size and structure. Venous: The inferior vena cava is normal in size with greater than 50% respiratory variability, suggesting right atrial pressure of 3 mmHg. IAS/Shunts: Negative bubble study, no evidence for PFO or ASD. Agitated saline contrast was given intravenously to evaluate for intracardiac shunting.  LEFT VENTRICLE PLAX 2D LVIDd:         4.60 cm  Diastology LVIDs:         3.00 cm  LV e' medial:    6.42 cm/s LV PW:         1.20 cm  LV E/e' medial:  8.6 LV IVS:        1.10 cm  LV e' lateral:   10.90 cm/s LVOT diam:     2.20 cm  LV E/e' lateral: 5.1 LV SV:         83 LV SV Index:   42 LVOT Area:     3.80 cm  RIGHT VENTRICLE             IVC RV Basal diam:  3.60  cm     IVC diam: 1.20 cm RV S prime:     13.70 cm/s TAPSE (M-mode): 2.4 cm LEFT ATRIUM             Index       RIGHT ATRIUM           Index LA diam:        3.20 cm 1.61 cm/m  RA Area:     14.50 cm LA Vol (A2C):   48.7 ml 24.49 ml/m RA Volume:   35.60 ml  17.90 ml/m LA Vol (A4C):   39.8 ml 20.02 ml/m LA Biplane Vol: 44.8 ml 22.53 ml/m  AORTIC VALVE AV Area (Vmax):    2.90 cm AV Area (Vmean):   2.59 cm AV Area (VTI):     2.46 cm AV Vmax:           168.00 cm/s AV Vmean:          119.000 cm/s AV VTI:            0.339 m AV Peak Grad:      11.3 mmHg AV Mean Grad:      6.0 mmHg LVOT Vmax:         128.00 cm/s LVOT Vmean:        81.000 cm/s LVOT VTI:          0.219 m LVOT/AV VTI ratio: 0.65  AORTA Ao Root diam: 3.30 cm Ao Asc diam:  3.30 cm MITRAL VALVE                TRICUSPID VALVE MV Area (PHT): 3.21 cm    TR Peak grad:   24.2 mmHg MV Decel Time: 236 msec    TR Vmax:        246.00 cm/s MV E velocity: 55.30 cm/s MV A velocity: 68.40 cm/s  SHUNTS MV E/A ratio:  0.81        Systemic VTI:  0.22 m                            Systemic Diam: 2.20 cm Marca Ancona MD Electronically signed by Marca Ancona MD Signature Date/Time: 07/10/2020/1:32:21 PM    Final     PHYSICAL EXAM  Temp:  [97.6 F (36.4 C)-98.4 F (36.9 C)] 97.6 F (36.4 C) (05/04 1505) Pulse Rate:  [57-65] 65 (05/04 1505) Resp:  [16-17] 16 (05/04 1505) BP: (127-142)/(75-100) 127/91 (05/04 1505) SpO2:  [99 %-100 %] 100 % (05/04 1505)  General - Well nourished, well developed, in no apparent distress.  Ophthalmologic - fundi not visualized due to noncooperation.  Cardiovascular - Regular rhythm and rate.  Mental Status -  Level of arousal and orientation to time, place, and person were intact. Language including expression, naming, repetition, comprehension was assessed and found intact. Fund of Knowledge was assessed and was intact.  Cranial Nerves II - XII - II - Visual field intact OU. III, IV, VI - Extraocular movements intact. V - Facial sensation decreased on the left cheek VII - left nasolabial fold flattening but equal movement b/l. VIII - Hearing & vestibular intact bilaterally. X - Palate elevates symmetrically. XI - Chin turning & shoulder shrug intact bilaterally. XII - Tongue protrusion intact.  Motor Strength - The patient's strength was normal in RUE and BLEs, however left UE proximal and bicep and tricep and wrist extension 3/5, finger grip 1/5.  Bulk was normal and fasciculations were absent.  Motor Tone - Muscle tone was assessed at the neck and appendages and was increased left UE.  Reflexes - The patient's reflexes were 1+ in all extremities and he had no pathological reflexes, no hoffman's bilaterally.  Sensory - Light touch, temperature/pinprick were  assessed and were symmetrical  Coordination - The patient had normal movements in the right hand with no ataxia or dysmetria.  Tremor was absent.  Gait and Station - deferred.   ASSESSMENT/PLAN [name] is a 55 y.o. male with history of hypertension, hyperlipidemia, smoker admitted for left arm hand weakness for 6 months, worsening for [redacted] weeks along with left facial numbness. No tPA given due to outside window.    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 The punctate right cerebellum infarct could be synchronized small vessel etiology given uncontrolled risk factors, however, cardioembolic source is still in DDx  CT head right MCA frontoparietal infarct  CTA head and neck right ICA occlusion, right MCA attenuated flow, right A1 high-grade stenosis.  MRI right MCA and watershed area scattered infarcts.  Right cerebellum punctate infarct.  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 fib  LDL 118  HgbA1c 4.5  Hypercoagulable work-up negative except homocystine 15.9 slightly elevated likely due to smoking  UDS neg  lovenox for VTE prophylaxis  No antithrombotic prior to admission, now on aspirin 81.  Continue on discharge, no DAPT given small IVH.  Ongoing aggressive stroke risk factor management  Therapy recommendations: Outpatient PT  Disposition:  pending  Small IVH, incidental finding, could be related to small vessel source given risk factors  MRI showed a small volume of Intraventricular Hemorrhage in the 4th ventricle  Etiology unclear  Repeat CT head 5/4 showed evolving IVH  Start aspirin 81.  Cervical myelopathy   MRI C-spine C6-7 myelopathy with cervical stenosis  Likely the cause for his neck pain, shoulder pain and 6 month duration of left arm and hand weakness  NSG consulted and  recommend outpt follow up  Carotid occlusion  CTA head and neck showed right ICA occluded at bulb  No intervention needed at this time  On aspirin 81, continue on discharge.  No DAPT given IVH.  Hypertension . Stable on the high end . Resume home meds - norvasc 10 and HCTZ 12.5 and lisinopril 20 . BP goal < 160 given IVH  Long term BP goal normotensive   Hyperlipidemia  Home meds:  none   LDL 118, goal < 70  Now on lipitor 40  Continue statin at discharge  Tobacco abuse  Current smoker  1PPD for 40 years now  Smoking cessation counseling provided  Pt is willing to quit  Other Stroke Risk Factors    Other Active Problems    Hospital day # 2  Neurology will sign off. Please call with questions. Pt will follow up with stroke clinic NP at [facility] in about 4 weeks. Thanks for the consult.   Marvel Plan, MD PhD Stroke Neurology 07/12/2020 8:29 PM    To contact Stroke Continuity provider, please refer to [email or web address]. After hours, contact General Neurology

## 2020-07-12 NOTE — Discharge Instructions (Signed)

## 2020-07-12 NOTE — Telephone Encounter (Signed)
    Requested 30-day monitor for acute CVA per neurology. Pt was not seen by cardiology during his visit however has been set up with Dr. Shari Prows for new patient appointment on 09/21/20.

## 2020-07-12 NOTE — Progress Notes (Signed)
Occupational Therapy Treatment Patient Details Name: Robert [name] MRN: [id number] DOB: 06-23-1965 Today's Date: 07/12/2020    History of present illness Pt is a 55 y/o male with who presented to ED for evaluation of intermittent L hand weakness on 07/09/20. Reports L hand weakness, facial numbness and decreased vision. CT reveals acute R MCA infarct. Symptoms present for several months but worsened over past 5-6 days (pt delayed care due to no insurance). PMH of HTN and tobacco use   OT comments  Pt fit with resting hand splint to L UE, good fit and comfortable--recommended use for nighttime for 6-8 hours.  Patient educated/completed exercises for L hand: wrist flexion/extension while maintaining finger extension as able, hand flexion/extension, and supination/pronation with wrist/finger extension as able.  Provided HEP with exercises and stretches to L UE.  Pt verbalized understanding.  Plan to follow for further assessment of possible wrist cock up splint to increase functional use of LUE.    OT NOTE  RN STAFF  Wear resting hand splint to LUE at night time 6-8 hours  Please check splint every 4 hours during shift ( remove splint , remove stockinette/ dressing present) to assess for: * pain * redness *swelling  If any symptoms above present remove splint for 15 minutes. If symptoms continue - keep the splint removed and notify OT staff [phone] immediately.   Keep the UE elevated at all times on pillows / towels.    Follow Up Recommendations  Outpatient OT;Supervision/Assistance - 24 hour    Equipment Recommendations  3 in 1 bedside commode    Recommendations for Other Services      Precautions / Restrictions Precautions Precautions: Fall       Mobility Bed Mobility Overal bed mobility: Modified Independent             General bed mobility comments: no assist required    Transfers Overall transfer level: Needs assistance Equipment used: None Transfers: Sit  to/from Stand Sit to Stand: Supervision         General transfer comment: for safety only, no physical assist    Balance Overall balance assessment: Needs assistance Sitting-balance support: No upper extremity supported Sitting balance-Leahy Scale: Normal     Standing balance support: No upper extremity supported Standing balance-Leahy Scale: Good               High level balance activites: Backward walking;Direction changes;Head turns High Level Balance Comments: WFL fast/slow walking, step over object, backwards walking, pick up object from ground. Min difficulty with tandem gait x10 ft with unsteadiness/UE support, weaving through objects           ADL either performed or assessed with clinical judgement   ADL                                               Vision       Perception     Praxis      Cognition Arousal/Alertness: Awake/alert Behavior During Therapy: Flat affect Overall Cognitive Status: Impaired/Different from baseline Area of Impairment: Following commands;Safety/judgement;Awareness;Problem solving                       Following Commands: Follows one step commands consistently;Follows multi-step commands with increased time Safety/Judgement: Decreased awareness of deficits;Decreased awareness of safety Awareness: Emergent Problem Solving: Slow processing;Decreased initiation;Difficulty sequencing;Requires verbal cues;Requires  tactile cues General Comments: patient requires cueing to follow mulitple step commands during exercises and stretches to LUE        Exercises     Shoulder Instructions       General Comments pt fit with resting hand splint to L UE, provided wearing scheudle of nighttime wear, placed schedule above bed and adjusted order in chart.  Patient engaged in exercises to L UE x 10 reps of hand flexion/extension, supination/pronation and wrist flexion/extension.  Pt requires cueing to control and  slow down exercises. Educated on stretches to LUE of "prayer" stretch to fingers/wrist, elbow extension, wrist/finger extension and supination stretch.    Pertinent Vitals/ Pain       Pain Assessment: Faces Faces Pain Scale: No hurt  Home Living                                          Prior Functioning/Environment              Frequency  Min 2X/week        Progress Toward Goals  OT Goals(current goals can now be found in the care plan section)  Progress towards OT goals: Progressing toward goals  Acute Rehab OT Goals Patient Stated Goal: to get better, use my L hand OT Goal Formulation: With patient  Plan Discharge plan remains appropriate;Frequency remains appropriate    Co-evaluation                 AM-PAC OT "6 Clicks" Daily Activity     Outcome Measure   Help from another person eating meals?: A Little Help from another person taking care of personal grooming?: A Little Help from another person toileting, which includes using toliet, bedpan, or urinal?: A Little Help from another person bathing (including washing, rinsing, drying)?: A Little Help from another person to put on and taking off regular upper body clothing?: A Little Help from another person to put on and taking off regular lower body clothing?: A Little 6 Click Score: 18    End of Session    OT Visit Diagnosis: Other abnormalities of gait and mobility (R26.89);Muscle weakness (generalized) (M62.81);Hemiplegia and hemiparesis Hemiplegia - Right/Left: Left Hemiplegia - dominant/non-dominant: Dominant Hemiplegia - caused by: Cerebral infarction   Activity Tolerance Patient tolerated treatment well   Patient Left with call bell/phone within reach;in bed   Nurse Communication Mobility status        Time: 2229-7989 OT Time Calculation (min): 34 min  Charges: OT General Charges $OT Visit: 1 Visit OT Treatments $Neuromuscular Re-education: 23-37 mins  Barry Brunner, OT Acute Rehabilitation Services Pager [phone] Office [phone]    Chancy Milroy 07/12/2020, 4:23 PM

## 2020-07-12 NOTE — Progress Notes (Signed)
Vital signs stable.Discharge instructions (including medications) discussed with and copy provided to patient/caregiver. Pt verbalized understanding and pt was d/c home with his mother.

## 2020-07-13 ENCOUNTER — Other Ambulatory Visit: Payer: Self-pay

## 2020-07-17 ENCOUNTER — Other Ambulatory Visit: Payer: Self-pay

## 2020-07-17 ENCOUNTER — Other Ambulatory Visit: Payer: Self-pay | Admitting: *Deleted

## 2020-07-17 MED FILL — Gabapentin Tab 800 MG: ORAL | 30 days supply | Qty: 60 | Fill #0 | Status: AC

## 2020-07-17 MED FILL — Amlodipine Besylate Tab 10 MG (Base Equivalent): ORAL | 30 days supply | Qty: 30 | Fill #0 | Status: AC

## 2020-07-17 NOTE — Patient Outreach (Signed)
[facility]) Care Management  07/17/2020  Robert [name] 03-07-1966 [id number]    STROKE JOURNEY (IVR) [facility]-3W PROGRESSIVE CARE Enrolled Date: 07/14/20    Call Language: English Start Date: 07/15/20  Details Sat 07/15/20 [phone] Day # 1 PATIENT   STROKE FOLLOW-UP AND MEDICATION Time of Last Call Attempt - 1:01 PM  Who reached - Patient  Feeling worse overall? Yes  Questions/problems with meds? Yes  Scheduled a follow-up appointment? No  Filled new prescriptions? No  Been able to take every dose of meds? No    Outreach attempt #1, successful.  Identity verified.  This care manager introduced self and stated purpose of call.  [facility] care management services explained.    Member report he is recovering slowly from hospitalization.  Since his stroke, he has lost his job and worried about being able to pay hospital bills and copays for medications/MD appointments.  He has office visit scheduled with newly assigned PCP on 6/9 at [facility] and with cardiology on 7/20.  Does not have appointment with neurology yet, conference call placed and visit scheduled for 6/16.  Since member does not have insurance, his payment upon date of appointment is $200.  Contact information for [facility] department given to member, advised to call.  Medications reviewed, state he only has Atorvastatin and Nicotine patches.  All other medications he was not able to get, has been calling [employer] and [employer]  Was told to come to pharmacy in person today, advised to take discharge paperwork with him as he has not been able to tell them telephonically which medications he is in need of.  He verbalizes understanding, encouraged to contact this care manager with questions or issues.  Also encouraged to monitor blood pressure daily, prescription for machine was also on discharge papers.  Plan: RN CM will follow up with member within the next  week.  Kemper Durie, [place], MSN [facility] Care Management  [facility] Manager [phone]

## 2020-07-19 ENCOUNTER — Emergency Department (INDEPENDENT_AMBULATORY_CARE_PROVIDER_SITE_OTHER): Payer: Medicaid Other

## 2020-07-19 ENCOUNTER — Encounter: Payer: Self-pay | Admitting: Neurology

## 2020-07-19 DIAGNOSIS — I4891 Unspecified atrial fibrillation: Secondary | ICD-10-CM

## 2020-07-19 DIAGNOSIS — I639 Cerebral infarction, unspecified: Secondary | ICD-10-CM | POA: Diagnosis not present

## 2020-07-21 ENCOUNTER — Telehealth: Payer: Self-pay | Admitting: Student

## 2020-07-21 ENCOUNTER — Other Ambulatory Visit: Payer: Self-pay | Admitting: Student

## 2020-07-21 ENCOUNTER — Telehealth: Payer: Self-pay

## 2020-07-21 NOTE — Progress Notes (Signed)
Error

## 2020-07-21 NOTE — Telephone Encounter (Signed)
Received monitor alert for bradyarrythmia with 3 second pause for 07/21/2020 at 247am, CT.  Alert has been addressed by Marjie Skiff, PA-C and a voicemail left for pt to contact our office re: any symptoms.  Pt  Is scheduled for new patient appointment with Dr Shari Prows 09/27/20.  Alert taken to Dr Shari Prows for review and sign.

## 2020-07-21 NOTE — Telephone Encounter (Signed)
   Received page from [employer] about critical EKG. Called and spoke with staff who reported patient had a "bradyarrhyhmia with rates in the 30s to 40s and a 3 sec pause" this morning at 2:47am Central Time (3:47am EST). Reviewed chart. Patient has never been seen by our office. Event monitor was placed after recent CVA. He is scheduled to see Dr. Shari Prows as a new patient on 09/27/2020. Reviewed strips which show sinus bradycardia with rate in the 30s. He is not on any AV nodal agents. Suspect patient was sleeping at the time. Called patient and left message asking patient to give [place] a call if he is having any symptoms or has any questions. Otherwise, will just wait for full monitor results.  Corrin Parker, PA-C 07/21/2020 8:09 AM

## 2020-07-24 ENCOUNTER — Other Ambulatory Visit: Payer: Self-pay | Admitting: *Deleted

## 2020-07-24 NOTE — Patient Outreach (Signed)
[facility]) Care Management  07/24/2020  Sydney Azure May 25, 1965 [id number]   Outgoing call place to member to confirm he was able to obtain all discharge medications, no answer.  HIPAA compliant voice message left, will follow up within the next 3-4 business days.  Kemper Durie, [place], MSN [facility] Care Management  [facility] Manager [phone]

## 2020-07-27 ENCOUNTER — Other Ambulatory Visit: Payer: Self-pay | Admitting: *Deleted

## 2020-07-27 NOTE — Patient Outreach (Addendum)
[facility]) Care Management  07/27/2020  Thomas Mabry Oct 13, 1965 [id number]   Outreach attempt #2, unsuccessful, HIPAA compliant voice message left.  Will send outreach letter and follow up within the next 3-4 business days.    Update:  Incoming call received back from member, state he was able to get all medications and is taking as instructed. He is concerned about the copay he will have to pay when he is seen by Neurology.  He is unsure if there will be a copay with the newly assigned PCP but will call to inquire.  He has reached out to [facility] financial assistance department and has a pending Medicaid application.  Has not been monitoring his blood pressure, does not have a machine.  Will send machine and log for monitoring.  Denies any urgent concerns, encouraged to contact this care manager with questions.  Agrees to follow up within the next 3 weeks.  Kemper Durie, [place], MSN [facility] Care Management  [facility] Manager [phone]

## 2020-07-27 NOTE — Patient Instructions (Signed)
Blood Pressure Record Sheet To take your blood pressure, you will need a blood pressure machine. You can buy a blood pressure machine (blood pressure monitor) at your clinic, drug store, or online. When choosing one, consider:  An automatic monitor that has an arm cuff.  A cuff that wraps snugly around your upper arm. You should be able to fit only one finger between your arm and the cuff.  A device that stores blood pressure reading results.  Do not choose a monitor that measures your blood pressure from your wrist or finger. Follow your health care provider's instructions for how to take your blood pressure. To use this form:  Get one reading in the morning (a.m.) before you take any medicines.  Get one reading in the evening (p.m.) before supper.  Take at least 2 readings with each blood pressure check. This makes sure the results are correct. Wait 1-2 minutes between measurements.  Write down the results in the spaces on this form.  Repeat this once a week, or as told by your health care provider.  Make a follow-up appointment with your health care provider to discuss the results. Blood pressure log Date: _______________________  a.m. _____________________(1st reading) _____________________(2nd reading)  p.m. _____________________(1st reading) _____________________(2nd reading) Date: _______________________  a.m. _____________________(1st reading) _____________________(2nd reading)  p.m. _____________________(1st reading) _____________________(2nd reading) Date: _______________________  a.m. _____________________(1st reading) _____________________(2nd reading)  p.m. _____________________(1st reading) _____________________(2nd reading) Date: _______________________  a.m. _____________________(1st reading) _____________________(2nd reading)  p.m. _____________________(1st reading) _____________________(2nd reading) Date: _______________________  a.m.  _____________________(1st reading) _____________________(2nd reading)  p.m. _____________________(1st reading) _____________________(2nd reading) This information is not intended to replace advice given to you by your health care provider. Make sure you discuss any questions you have with your health care provider. Document Revised: 06/16/2019 Document Reviewed: 06/16/2019 Elsevier Patient Education  2021 Elsevier Inc. How to Take Your Blood Pressure Blood pressure measures how strongly your blood is pressing against the walls of your arteries. Arteries are blood vessels that carry blood from your heart throughout your body. You can take your blood pressure at home with a machine. You may need to check your blood pressure at home:  To check if you have high blood pressure (hypertension).  To check your blood pressure over time.  To make sure your blood pressure medicine is working. Supplies needed:  Blood pressure machine, or monitor.  Dining room chair to sit in.  Table or desk.  Small notebook.  Pencil or pen. How to prepare Avoid these things for 30 minutes before checking your blood pressure:  Having drinks with caffeine in them, such as coffee or tea.  Drinking alcohol.  Eating.  Smoking.  Exercising. Do these things five minutes before checking your blood pressure:  Go to the bathroom and pee (urinate).  Sit in a dining chair. Do not sit in a soft couch or an armchair.  Be quiet. Do not talk. How to take your blood pressure Follow the instructions that came with your machine. If you have a digital blood pressure monitor, these may be the instructions: 1. Sit up straight. 2. Place your feet on the floor. Do not cross your ankles or legs. 3. Rest your left arm at the level of your heart. You may rest it on a table, desk, or chair. 4. Pull up your shirt sleeve. 5. Wrap the blood pressure cuff around the upper part of your left arm. The cuff should be 1 inch (2.5  cm) above your elbow. It is best to  wrap the cuff around bare skin. 6. Fit the cuff snugly around your arm. You should be able to place only one finger between the cuff and your arm. 7. Place the cord so that it rests in the bend of your elbow. 8. Press the power button. 9. Sit quietly while the cuff fills with air and loses air. 10. Write down the numbers on the screen. 11. Wait 2-3 minutes and then repeat steps 1-10.   What do the numbers mean? Two numbers make up your blood pressure. The first number is called systolic pressure. The second is called diastolic pressure. An example of a blood pressure reading is "120 over 80" (or 120/80). If you are an adult and do not have a medical condition, use this guide to find out if your blood pressure is normal: Normal  First number: below 120.  Second number: below 80. Elevated  First number: 120-129.  Second number: below 80. Hypertension stage 1  First number: 130-139.  Second number: 80-89. Hypertension stage 2  First number: 140 or above.  Second number: 90 or above. Your blood pressure is above normal even if only the top or bottom number is above normal. Follow these instructions at home:  Check your blood pressure as often as your doctor tells you to.  Check your blood pressure at the same time every day.  Take your monitor to your next doctor's appointment. Your doctor will: ? Make sure you are using it correctly. ? Make sure it is working right.  Make sure you understand what your blood pressure numbers should be.  Tell your doctor if your medicine is causing side effects.  Keep all follow-up visits as told by your doctor. This is important. General tips:  You will need a blood pressure machine, or monitor. Your doctor can suggest a monitor. You can buy one at a drugstore or online. When choosing one: ? Choose one with an arm cuff. ? Choose one that wraps around your upper arm. Only one finger should fit between  your arm and the cuff. ? Do not choose one that measures your blood pressure from your wrist or finger. Where to find more information American Heart Association: www.heart.org Contact a doctor if:  Your blood pressure keeps being high. Get help right away if:  Your first blood pressure number is higher than 180.  Your second blood pressure number is higher than 120. Summary  Check your blood pressure at the same time every day.  Avoid caffeine, alcohol, smoking, and exercise for 30 minutes before checking your blood pressure.  Make sure you understand what your blood pressure numbers should be. This information is not intended to replace advice given to you by your health care provider. Make sure you discuss any questions you have with your health care provider. Document Revised: 02/19/2019 Document Reviewed: 02/19/2019 Elsevier Patient Education  2021 [employer].

## 2020-07-31 ENCOUNTER — Emergency Department (HOSPITAL_COMMUNITY): Payer: Medicaid Other

## 2020-07-31 ENCOUNTER — Ambulatory Visit: Payer: No Typology Code available for payment source | Admitting: Cardiology

## 2020-07-31 ENCOUNTER — Emergency Department (HOSPITAL_COMMUNITY)
Admission: EM | Admit: 2020-07-31 | Discharge: 2020-07-31 | Disposition: A | Discharge status: Home / Self Care | Payer: Medicaid Other | Attending: Emergency Medicine | Admitting: Emergency Medicine

## 2020-07-31 ENCOUNTER — Other Ambulatory Visit: Payer: Self-pay

## 2020-07-31 ENCOUNTER — Encounter (HOSPITAL_COMMUNITY): Payer: Self-pay | Admitting: Emergency Medicine

## 2020-07-31 ENCOUNTER — Telehealth: Payer: Self-pay | Admitting: Internal Medicine

## 2020-07-31 DIAGNOSIS — R009 Unspecified abnormalities of heart beat: Secondary | ICD-10-CM | POA: Insufficient documentation

## 2020-07-31 DIAGNOSIS — F17210 Nicotine dependence, cigarettes, uncomplicated: Secondary | ICD-10-CM | POA: Diagnosis not present

## 2020-07-31 DIAGNOSIS — R001 Bradycardia, unspecified: Secondary | ICD-10-CM | POA: Diagnosis not present

## 2020-07-31 DIAGNOSIS — I455 Other specified heart block: Secondary | ICD-10-CM | POA: Diagnosis not present

## 2020-07-31 DIAGNOSIS — I10 Essential (primary) hypertension: Secondary | ICD-10-CM

## 2020-07-31 DIAGNOSIS — Z7982 Long term (current) use of aspirin: Secondary | ICD-10-CM | POA: Insufficient documentation

## 2020-07-31 DIAGNOSIS — Z20822 Contact with and (suspected) exposure to covid-19: Secondary | ICD-10-CM | POA: Insufficient documentation

## 2020-07-31 DIAGNOSIS — Z79899 Other long term (current) drug therapy: Secondary | ICD-10-CM | POA: Insufficient documentation

## 2020-07-31 LAB — CBC
HCT: 36.7 % — ABNORMAL LOW (ref 39.0–52.0)
Hemoglobin: 12.1 g/dL — ABNORMAL LOW (ref 13.0–17.0)
MCH: 31.8 pg (ref 26.0–34.0)
MCHC: 33 g/dL (ref 30.0–36.0)
MCV: 96.3 fL (ref 80.0–100.0)
Platelets: 280 10*3/uL (ref 150–400)
RBC: 3.81 MIL/uL — ABNORMAL LOW (ref 4.22–5.81)
RDW: 11.8 % (ref 11.5–15.5)
WBC: 7.7 10*3/uL (ref 4.0–10.5)
nRBC: 0 % (ref 0.0–0.2)

## 2020-07-31 LAB — BASIC METABOLIC PANEL
Anion gap: 8 (ref 5–15)
BUN: 13 mg/dL (ref 6–20)
CO2: 25 mmol/L (ref 22–32)
Calcium: 8.6 mg/dL — ABNORMAL LOW (ref 8.9–10.3)
Chloride: 105 mmol/L (ref 98–111)
Creatinine, Ser: 1.03 mg/dL (ref 0.61–1.24)
GFR, Estimated: >60 mL/min (ref >60)
Glucose, Bld: 108 mg/dL — ABNORMAL HIGH (ref 70–99)
Potassium: 3.7 mmol/L (ref 3.5–5.1)
Sodium: 138 mmol/L (ref 135–145)

## 2020-07-31 LAB — TROPONIN I (HIGH SENSITIVITY)
Troponin I (High Sensitivity): 6 ng/L (ref <18)
Troponin I (High Sensitivity): 5 ng/L (ref <18)

## 2020-07-31 LAB — RESP PANEL BY RT-PCR (FLU A&B, COVID) ARPGX2
Influenza A by PCR: NEGATIVE
Influenza B by PCR: NEGATIVE
SARS Coronavirus 2 by RT PCR: NEGATIVE

## 2020-07-31 IMAGING — DX DG CHEST 2V
2 series · 2 of 2 positions shown · non-contrast
Comparison: Chest radiograph dated [DATE]

CLINICAL DATA: 54-year-old male with chest pain

EXAM:
CHEST - 2 VIEW

[chest pa]
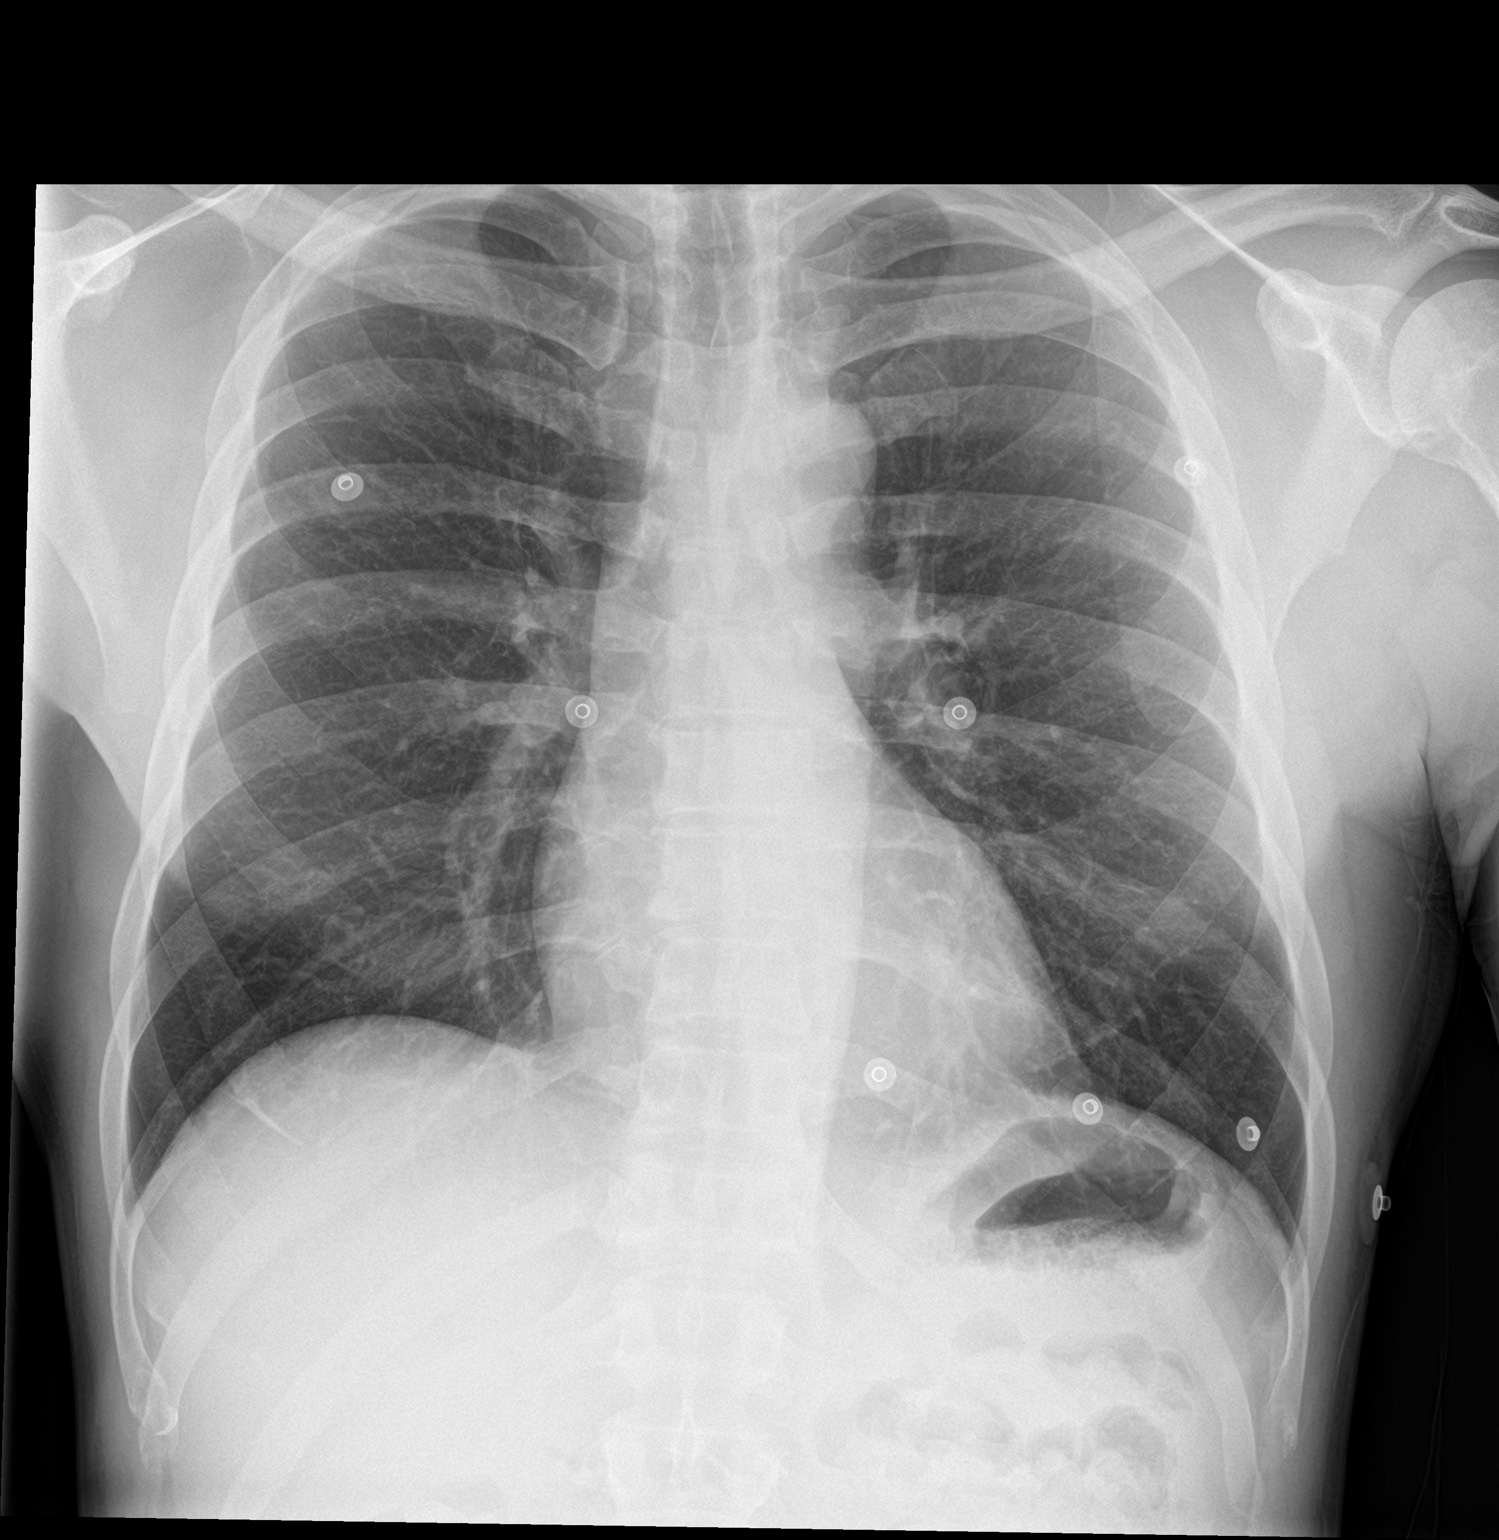

[chest lat]
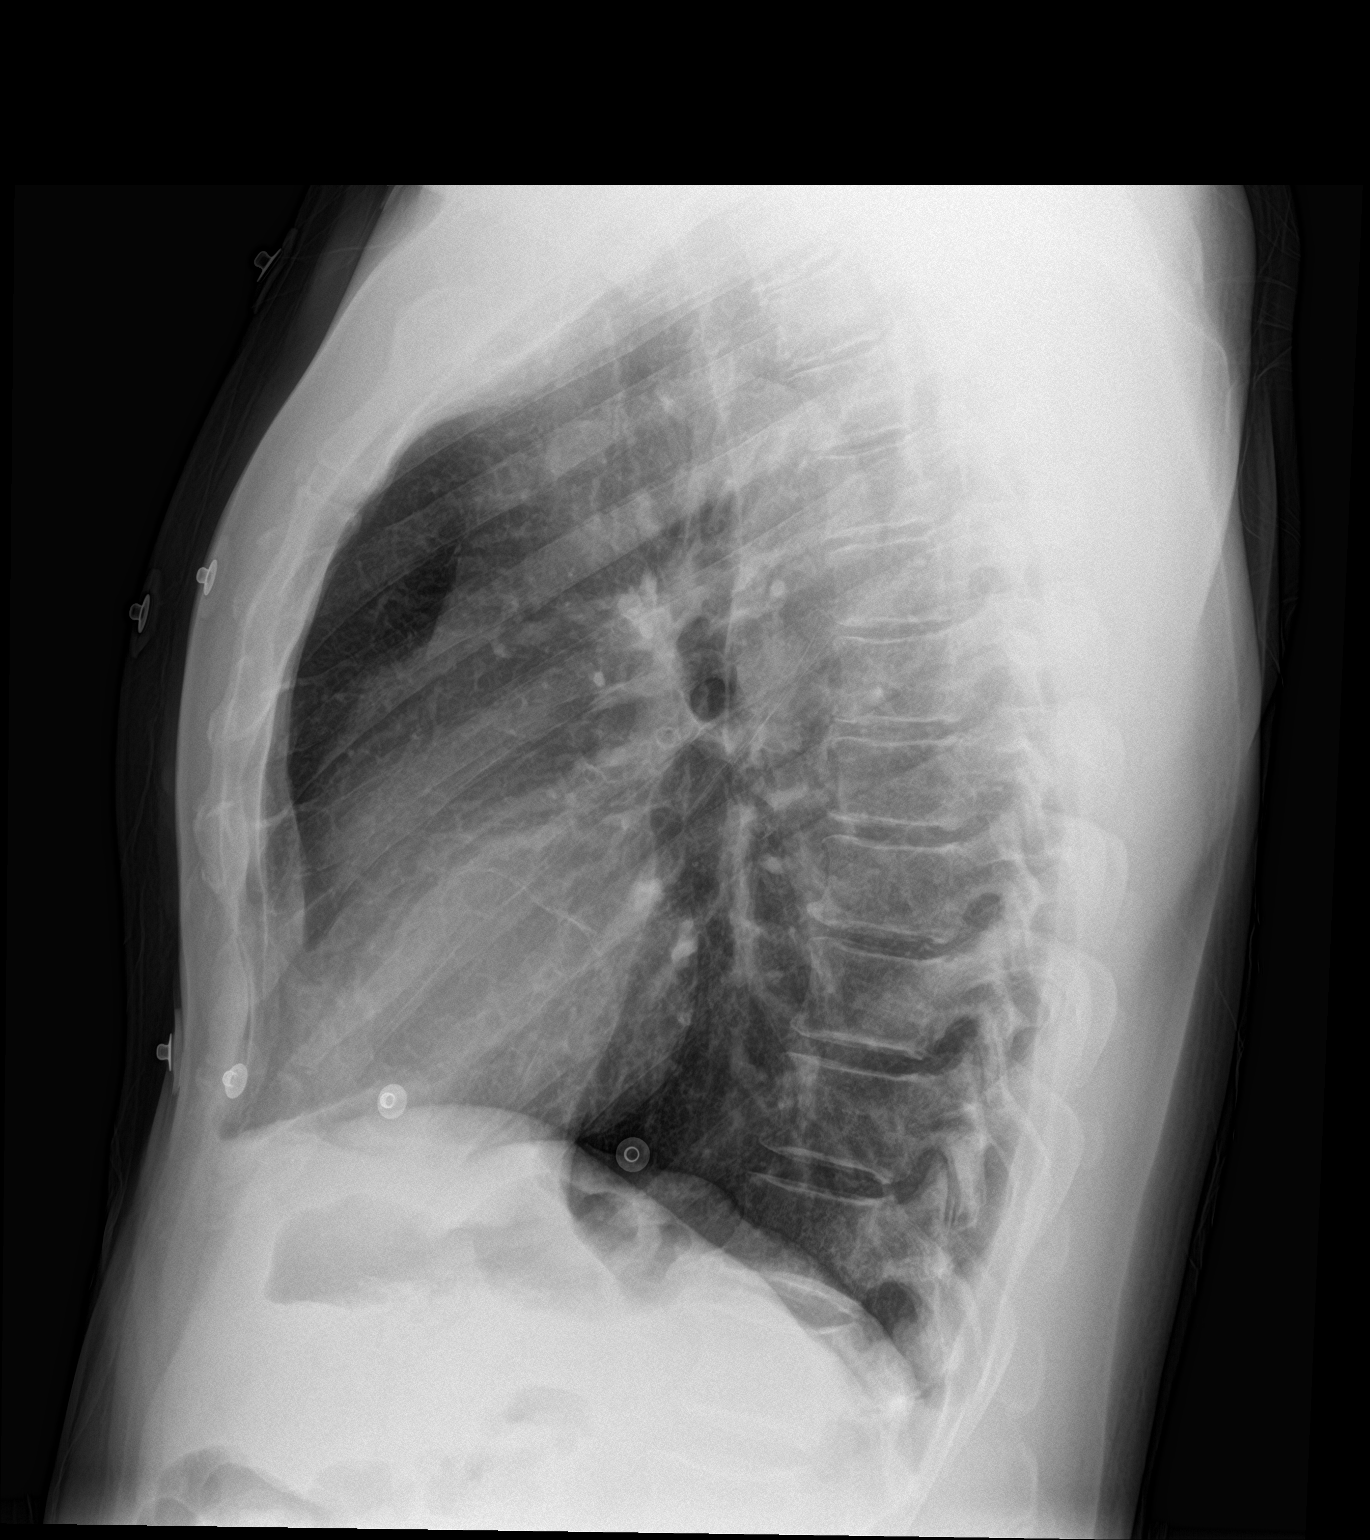

[2 of 2 positions shown; findings below may reference images not displayed]

FINDINGS: The heart size and mediastinal contours are within normal limits.
Both lungs are clear. The visualized skeletal structures are
unremarkable.
IMPRESSION: No active cardiopulmonary disease.

## 2020-07-31 MED ORDER — LISINOPRIL-HYDROCHLOROTHIAZIDE 20-12.5 MG PO TABS
1.0000 | ORAL_TABLET | Freq: Every day | ORAL | 2 refills | Status: DC
  Filled 2020-08-01: qty 30, 30d supply, fill #0
  Filled 2020-09-08 – 2020-09-20 (×2): qty 30, 30d supply, fill #1
  Filled 2020-12-01: qty 30, 30d supply, fill #2

## 2020-07-31 MED ORDER — SODIUM CHLORIDE 0.9 % IV SOLN: INTRAVENOUS | Status: DC

## 2020-07-31 NOTE — Telephone Encounter (Signed)
No DPR listed in pt's chart.  Attempted phone call to both emergency contact numbers with no answer.  Did not leave voicemail message.

## 2020-07-31 NOTE — ED Triage Notes (Signed)
Pt here from home sent over from Cards due to having 2 pauses according to his heart monitor, pt  States that he was asleep one and did not feel anything for the second one either

## 2020-07-31 NOTE — Telephone Encounter (Signed)
Spoke with pt and advised per Dr Shari Prows pt should bypass office and report to ED d/t significant heart arrhythmia and pauses.    She will work on having pt direct admitted in the mean time.  Pt verbalizes understanding and is agreeable to current plan.

## 2020-07-31 NOTE — Telephone Encounter (Signed)
   Called by [employer] monitoring for overnight period of bradycardia and 6 second pause - there were unable to reach to the patient to verify if he was having symptoms. Review of the chart shows bradycardia and pauses a couple of weeks ago up to 3 seconds. No AVN blocking medications are noted. Will try to reach out to the patient today.  Chrystie Nose, MD, [facility], FACP  [place]  [facility]  Medical Director of the [facility]  Cardiovascular [facility] Reduction Clinic Diplomate of the American Board of Clinical Lipidology Attending Cardiologist  Direct Dial: [phone]  Fax: [phone]  Website:  [email or web address]

## 2020-07-31 NOTE — Telephone Encounter (Signed)
Attempted phone call to pt.  No answer and voicemail is not available.

## 2020-07-31 NOTE — Telephone Encounter (Signed)
Attempted phone call to pt.  Left voicemail message that it is urgent pt contact RN at [phone].

## 2020-07-31 NOTE — Telephone Encounter (Signed)
Phone call to [facility] non emergency number and requested a welfare visit be made to ensure pt's health and safety.  Per dispatcher officer will be sent out and will return call with findings.

## 2020-07-31 NOTE — ED Notes (Signed)
In XRAY  
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
## 2020-07-31 NOTE — Discharge Instructions (Addendum)
Follow-up with your cardiologist as you have been instructed
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
## 2020-07-31 NOTE — Telephone Encounter (Signed)
Monitor alerts received from [employer] for 07/31/2020 at 4:00am with Sinus brady and pause (3.6-6.0 sec) and 07/31/2020 7:08am with Sinus brady and 8.6 second pause.  Alerts taken to Dr Shari Prows for review and advised RN has been unable to make contact with pt.  Dr Shari Prows recommends pt have appointment this week with general cards provider and referral to EP.  Pt to also be advised no driving.

## 2020-07-31 NOTE — Consult Note (Addendum)
Cardiology Consultation:   Patient ID: Robert KuRobert [name] MRN: [id number]; DOB: 1965/07/23  Admit date: 07/31/2020 Date of Consult: 07/31/2020  PCP:  Oneita HurtPcp, No   [facility] Providers Cardiologist:  [facility], was scheduled to see Dr. Shari ProwsPemberton, though not seen her yet   Patient Profile:   Robert KuRobert [name] is a 55 y.o. male with a hx ofHTN, HLD, smopker and recent stroke who is being seen 07/31/2020 for the evaluation of sinus pauses, at the request of Dr. Freida BusmanAllen.  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 The punctate right cerebellum infarct could be synchronized small vessel etiology given uncontrolled risk factors, however, cardioembolic source is still in DDx and requested that he be discharged with monitoring in place.  [facility] with plans to have follow up with Dr. 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  I do not see that he did.  TODAY we received from [employer] for 07/31/2020 at 4:00am (CST)  with Sinus brady and pause (3.6-6.0 sec) and 07/31/2020 7:08am (CST) with Sinus brady and 8.6 second pause.  It took quite a while to get a hold of the patient, in review with Dr. Shari ProwsPemberton, initially recommended that he get an appt to go to the office. The patient did call back, and reported that the 4:00AM event he was sleeping, but awake for the 7:08 pause of 8.6 seconds, would have stoill been in bed and did not recall any symptoms or  syncope. Ultimately decided to refer the patient to the ER for EP to see given on no nodal blocking agents and awake during the last pause.  LABS K+ 3.7 BUN/Creat 13/1.03 HS Trop 6 WBC 7.7 H/H 12/36 Plts 280  CXR with NAD  The patient reports no symptoms, no dizzy spells, near syncope or syncope today. He denies any historical syncope or recurrent near syncope. Except for once since his stroke, prior to the monitor, he was moving stuff from storage was a very hot day and he became lightheaded, weak and went to his car to cool off and felt better    Past Medical History:  Diagnosis Date  . Hypertension     Past Surgical History:  Procedure Laterality Date  . TONSILLECTOMY AND ADENOIDECTOMY       Home Medications:  Prior to Admission medications   Medication Sig Start Date End Date Taking? Authorizing Provider  amLODipine (NORVASC) 10 MG tablet TAKE 1 TABLET BY MOUTH DAILY. Patient taking differently: Take 10 mg by mouth daily. 04/26/20 04/26/21  Mayers, Kasandra Knudsenari S, PA-C  aspirin EC 81 MG tablet Take 1 tablet (81 mg total) by mouth daily. Swallow whole. 07/12/20   Hongalgi, Maximino GreenlandAnand D, MD  atorvastatin (LIPITOR) 40 MG tablet Take 1 tablet (40 mg total) by mouth daily. 07/13/20   Hongalgi, Maximino GreenlandAnand D, MD  Blood Pressure Monitoring (PREMIUM AUTOMATIC BP MONITOR) DEVI 1 Device by Does not apply route daily. Patient not taking: No sig reported 11/04/14  Weber, Sarah L, PA-C  gabapentin (NEURONTIN) 800 MG tablet TAKE 1 TABLET (800 MG TOTAL) BY MOUTH 2 (TWO) TIMES DAILY. Patient taking differently: Take 800 mg by mouth 2 (two) times daily. 04/26/20 04/26/21  Mayers, Cari S, PA-C  lisinopril-hydrochlorothiazide (ZESTORETIC) 20-12.5 MG tablet Take 1 tablet by mouth daily. 04/26/20   Mayers, Cari S, PA-C  nicotine (NICODERM CQ - DOSED IN MG/24 HOURS) 14 mg/24hr patch Place 1 patch (14 mg total) onto the skin daily. 07/13/20   Elease Etienne, MD    Inpatient Medications: Scheduled Meds:  Continuous  Infusions: . sodium chloride     PRN Meds:   Allergies:   No Known Allergies  Social History:   Social History   Socioeconomic History  . Marital status: Single    Spouse name: Not on file  . Number of children: 2  . Years of education: HS diploma and some college  . Highest education level: Not on file  Occupational History  . Occupation: truck Hospital doctor    Comment: [employer]  . Smoking status: Current Every Day Smoker    Packs/day: 0.50    Types: Cigarettes  . Smokeless tobacco: Never Used  Substance and Sexual Activity  . Alcohol use: Yes    Alcohol/week: 0.0 standard drinks  . Drug use: No  . Sexual activity: Not Currently  Other Topics Concern  . 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 Hypertension Mother   . Hypertension Father   . Hypertension Brother      ROS:  Please see the history of present illness.  All other ROS reviewed and negative.     Physical Exam/Data:   Vitals:   07/31/20 1521  BP: (!) 140/101  Pulse: 67  Resp: 15  Temp: 99.1 F (37.3 C)  SpO2: 100%   No intake or output data in the 24 hours ending 07/31/20 1608 Last 3 Weights 04/26/2020 10/31/2015 12/06/2014  Weight (lbs) 170 lb 171 lb 6.4 oz 191 lb  Weight (kg) 77.111 kg 77.747 kg 86.637 kg     There is no height or weight on file to calculate BMI.  General:  Well nourished, well developed, in no acute distress HEENT: normal Lymph: no adenopathy Neck: no JVD Endocrine:  No thryomegaly Vascular: No carotid bruits  Cardiac: RRR; no murmurs, gallops  or rubs Lungs:  CTA b/l, no wheezing, rhonchi or rales  Abd: soft, nontender, no hepatomegaly  Ext: no edema Musculoskeletal:  No deformities, BUE and BLE strength normal and equal Skin: warm and dry  Neuro:  Known baseline L hand pain/numbness, weakness, no other focal abnormalities noted Psych:  Normal affect   EKG:  The EKG was personally reviewed and demonstrates:    SR 64bpm, RBBB, RAD, PVC 07/09/20: SRR 79bpm, RBBB, RAD 11/04/2014: SB 58bpm,  RBBB, RAD    Telemetry:  Telemetry was personally reviewed and demonstrates:   SB/SR 50's-60's    Relevant CV Studies:  07/10/20: TTE IMPRESSIONS  1. Left ventricular ejection fraction, by estimation,  is 55 to 60%. The  left ventricle has normal function. The left ventricle has no regional  wall motion abnormalities. Left ventricular diastolic parameters are  consistent with Grade I diastolic  dysfunction (impaired relaxation).  2. Right ventricular systolic function is normal. The right ventricular  size is normal. There is normal pulmonary artery systolic pressure. The  estimated right ventricular systolic pressure is 27.2 mmHg.  3. Negative bubble study, no evidence for PFO or ASD.  4. The mitral valve is normal in structure. Trivial mitral valve  regurgitation. No evidence of mitral stenosis.  5. The aortic valve is tricuspid. Aortic valve regurgitation is not  visualized. No aortic stenosis is present.  6. The inferior vena cava is normal in size with greater than 50%  respiratory variability, suggesting right atrial pressure of 3 mmHg.   Laboratory Data:  High Sensitivity Troponin:  No results for input(s): TROPONINIHS in the last 720 hours.   ChemistryNo results for input(s): NA, K, CL, CO2, GLUCOSE, BUN, CREATININE, CALCIUM, GFRNONAA, GFRAA, ANIONGAP in the last 168 hours.  No results for input(s): PROT, ALBUMIN, AST, ALT, ALKPHOS, BILITOT in the last 168 hours. HematologyNo results for input(s): WBC, RBC, HGB, HCT,  MCV, MCH, MCHC, RDW, PLT in the last 168 hours. BNPNo results for input(s): BNP, PROBNP in the last 168 hours.  DDimer No results for input(s): DDIMER in the last 168 hours.   Radiology/Studies:  No results found.   Assessment and Plan:   1. Sinus pauses       02:37 (03:37 EST) is SB with sinus slowing to a pasue 3 seconds, sinus arrhythmia with rates 40's > 50's      04:00 (05:00EST) also SB with sinus slowing to a 3.6 second pause > SB 30's > sinus pause 6 seconds > SB       07:08 (08:08 EST)tracing (no full disclosure) is available, is sinus pause of 8.6 seconds > SB 40's       (unable to see rates leading to this pause)  Dr. Graciela Husbands reviewed tracings, also appreciated P-P prolongation as well  The patient was sleeping on the couch, heard his cousin get up and ready for work, about 4 or so this am but he stayed on the couch and dozed on/off but did not get up and moving himself probably about 08:30.  On amlodipine, no nodal blocking agents VSS RBBB goes back years  Dr. Graciela Husbands has seen and examined the patient, nocturnal pauses with P-P prolongation as well are likely vagally mediated associated with sleep, no indication at this time for PPM Dr. Graciela Husbands discussed symptoms to make [place] aware of and return to ER precautions.   Risk Assessment/Risk Scores:  {  For questions or updates, please contact [facility] Please consult [email or web address] for contact info under    Signed, Sheilah Pigeon, PA-C  07/31/2020 4:08 PM;  Sinus node pauses  Stroke secondary to?  Right ICA occlusion   Hypertension   PE and Hx as above   The patient has sinus pauses without history of abrupt onset/offset syncope or presyncope.  In addition, these pauses were associated with PR prolongation, the opposite of 1 might expect with rate related changes.  The time of these episodes were all before 8 AM and as such I suspect they are sleep related.  This is supported again by the absence of a history of  syncope.  He does not have known sleep apnea but would recommend that he have a sleep study.  This may be further contributing to his hypertension.  He is advised to continue the monitor and let us know if he has any episodes of abrupt onset offset presyncope  Reviewed with Dr. Freida Busman and Dr. Shari Prows

## 2020-07-31 NOTE — ED Provider Notes (Addendum)
[facility] EMERGENCY DEPARTMENT Provider Note   CSN: [id number] Arrival date & time: 07/31/20  1515     History No chief complaint on file.   Robert [name] is a 55 y.o. male.  55 year old male presents at the encouragement of his cardiologist due to having pauses with his heartbeat.  He has had 2 of these today.  1 occurred when he was asleep.  He himself denies any symptoms with these episodes.  No shortness of breath, chest pain.  Denies any syncope or near syncope.  Feels otherwise at his baseline state of health.  Was sent here for admission and likely EP referral        Past Medical History:  Diagnosis Date  . Hypertension     Patient Active Problem List   Diagnosis Date Noted  . Tobacco use 07/10/2020  . 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 Acute CVA (cerebrovascular accident) (HCC) 07/09/2020  . Cervical radiculopathy 04/26/2020  . Left upper extremity numbness 04/26/2020  . 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 HTN (hypertension) 11/04/2014    Past Surgical History:  Procedure Laterality Date  . TONSILLECTOMY AND ADENOIDECTOMY         Family History  Problem Relation Age of Onset  . Hypertension Mother   . Hypertension Father   . Hypertension Brother     Social History   Tobacco Use  . Smoking status: Current Every Day Smoker    Packs/day: 0.50    Types: Cigarettes  . Smokeless tobacco: Never Used  Substance Use Topics  . Alcohol use: Yes    Alcohol/week: 0.0 standard drinks  . Drug use: No    Home Medications Prior to Admission medications   Medication Sig Start Date End Date Taking? Authorizing Provider  amLODipine (NORVASC) 10 MG tablet TAKE 1 TABLET BY MOUTH DAILY. Patient taking differently: Take 10 mg by mouth daily. 04/26/20 04/26/21  Mayers, Kasandra Knudsen, PA-C  aspirin EC 81 MG tablet Take 1 tablet (81 mg total) by mouth daily. Swallow whole. 07/12/20   Hongalgi, Maximino Greenland, MD  atorvastatin (LIPITOR) 40 MG tablet Take 1  tablet (40 mg total) by mouth daily. 07/13/20   Hongalgi, Maximino Greenland, MD  Blood Pressure Monitoring (PREMIUM AUTOMATIC BP MONITOR) DEVI 1 Device by Does not apply route daily. Patient not taking: No sig reported 11/04/14   Weber, Sarah L, PA-C  gabapentin (NEURONTIN) 800 MG tablet TAKE 1 TABLET (800 MG TOTAL) BY MOUTH 2 (TWO) TIMES DAILY. Patient taking differently: Take 800 mg by mouth 2 (two) times daily. 04/26/20 04/26/21  Mayers, Cari S, PA-C  lisinopril-hydrochlorothiazide (ZESTORETIC) 20-12.5 MG tablet Take 1 tablet by mouth daily. 04/26/20   Mayers, Cari S, PA-C  nicotine (NICODERM CQ - DOSED IN MG/24 HOURS) 14 mg/24hr patch Place 1 patch (14 mg total) onto the skin daily. 07/13/20   Hongalgi, Maximino Greenland, MD    Allergies    Patient has no known allergies.  Review of Systems   Review of Systems  All other systems reviewed and are negative.   Physical Exam Updated Vital Signs BP (!) 140/101 (BP Location: Left Arm)   Pulse 67   Temp 99.1 F (37.3 C)   Resp 15   SpO2 100%   Physical Exam Vitals and nursing note reviewed.  Constitutional:      General: He is not in acute distress.    Appearance: Normal appearance. He is well-developed. He is not toxic-appearing.  HENT:     Head: Normocephalic and atraumatic.  Eyes:     General: Lids are normal.     Conjunctiva/sclera: Conjunctivae normal.     Pupils: Pupils are equal, round, and reactive to light.  Neck:     Thyroid: No thyroid mass.     Trachea: No tracheal deviation.  Cardiovascular:     Rate and Rhythm: Normal rate and regular rhythm.     Heart sounds: Normal heart sounds. No murmur heard. No gallop.   Pulmonary:     Effort: Pulmonary effort is normal. No respiratory distress.     Breath sounds: Normal breath sounds. No stridor. No decreased breath sounds, wheezing, rhonchi or rales.  Abdominal:     General: Bowel sounds are normal. There is no distension.     Palpations: Abdomen is soft.     Tenderness: There is no abdominal  tenderness. There is no rebound.  Musculoskeletal:        General: No tenderness. Normal range of motion.     Cervical back: Normal range of motion and neck supple.  Skin:    General: Skin is warm and dry.     Findings: No abrasion or rash.  Neurological:     Mental Status: He is alert and oriented to person, place, and time.     GCS: GCS eye subscore is 4. GCS verbal subscore is 5. GCS motor subscore is 6.     Cranial Nerves: No cranial nerve deficit.     Sensory: No sensory deficit.  Psychiatric:        Speech: Speech normal.        Behavior: Behavior normal.     ED Results / Procedures / Treatments   Labs (all labs ordered are listed, but only abnormal results are displayed) Labs Reviewed  RESP PANEL BY RT-PCR (FLU A&B, COVID) ARPGX2  BASIC METABOLIC PANEL  CBC  TROPONIN I (HIGH SENSITIVITY)    EKG EKG Interpretation  Date/Time:  Monday Jul 31 2020 15:23:29 EDT Ventricular Rate:  64 PR Interval:  176 QRS Duration: 156 QT Interval:  428 QTC Calculation: 441 R Axis:   202 Text Interpretation: Sinus rhythm with occasional Premature ventricular complexes Right bundle branch block Abnormal ECG No significant change since last tracing Confirmed by Lorre Nick ([id number]) on 07/31/2020 3:30:39 PM   Radiology No results found.  Procedures Procedures   Medications Ordered in ED Medications  0.9 %  sodium chloride infusion (has no administration in time range)    ED Course  I have reviewed the triage vital signs and the nursing notes.  Pertinent labs & imaging results that were available during my care of the patient were reviewed by me and considered in my medical decision making (see chart for details).    MDM Rules/Calculators/A&P                          Will contact cardiology for admission  5:37 PM Patient seen by cardiology and cleared for discharge.  They have arranged follow-up Final Clinical Impression(s) / ED Diagnoses Final diagnoses:  None     Rx / DC Orders ED Discharge Orders    None       Lorre Nick, MD 07/31/20 1605    Lorre Nick, MD 07/31/20 [phone]

## 2020-07-31 NOTE — Telephone Encounter (Signed)
Patient of Dr. Shari Prows - routed to [facility] triage team

## 2020-07-31 NOTE — Telephone Encounter (Signed)
Per Dr Shari Prows: contact pt and request that he go straight to the ED instead of being seen in the office.   Attempted phone call to pt.  No answer and unable to leave message d/t voicemail being full.

## 2020-08-01 ENCOUNTER — Telehealth: Payer: Self-pay

## 2020-08-01 ENCOUNTER — Other Ambulatory Visit: Payer: Self-pay

## 2020-08-01 DIAGNOSIS — I455 Other specified heart block: Secondary | ICD-10-CM

## 2020-08-01 NOTE — Telephone Encounter (Signed)
Received report from [employer] that this AM at 0327CST the patient had a 4.9 s pause.  The patient reports he was asleep at the time and unaware of any possible symptoms.   Spoke with Dr. Shari Prows, who requests the patient see Dr. Graciela Husbands (who saw the patient in the ER yesterday). Scheduled the patient with Dr. Graciela Husbands 5/26. He was grateful for call and agrees with plan.

## 2020-08-03 ENCOUNTER — Encounter: Payer: Self-pay | Admitting: Adult Health

## 2020-08-03 ENCOUNTER — Inpatient Hospital Stay: Payer: No Typology Code available for payment source | Admitting: Adult Health

## 2020-08-03 ENCOUNTER — Telehealth: Payer: Self-pay | Admitting: *Deleted

## 2020-08-03 ENCOUNTER — Ambulatory Visit: Payer: No Typology Code available for payment source | Admitting: Internal Medicine

## 2020-08-03 DIAGNOSIS — I455 Other specified heart block: Secondary | ICD-10-CM

## 2020-08-03 NOTE — Progress Notes (Deleted)
[facility] [address] [place]. [place] [address] [phone]       HOSPITAL FOLLOW UP NOTE  [name] Date of Birth:  1965-06-06 Medical Record Number:  [id number]   Reason for Referral:  hospital stroke follow up    SUBJECTIVE:   CHIEF COMPLAINT:  No chief complaint on file.   HPI:   Robert [name] is a 55 y.o. male with history of hypertension, hyperlipidemia, smoker admitted on 07/09/2020 for left arm hand weakness for 6 months, worsening for [redacted] weeks along with left facial numbness. No tPA given due to outside window.    Personally reviewed hospitalization pertinent progress notes, lab work and imaging with summary provided.  Stroke work-up revealed multifocal right MCA and watershed territory infarct, embolic, likely secondary to right ICA occlusion as well as punctate right cerebellum infarct possibly synchronized small vessel etiology given uncontrolled risk factors however cardioembolic source still in DDx.  Recommended 30-day cardiac event monitoring outpatient to rule out atrial fibrillation.  Hypercoagulable work-up negative except slightly elevated homocysteine level likely due to tobacco use.  Evidence of small IVH on imaging therefore DAPT not recommended but advised initiating aspirin 81 mg daily.  MRI C-spine C6-7 myelopathy with cervical stenosis likely cause of his neck pain, shoulder pain and left arm and hand weakness evaluated by neurosurgery and recommended outpatient follow-up.  No intervention required for carotid occlusion.  HTN stable on high end resuming home meds amlodipine, hydrochlorothiazide and lisinopril.  LDL 118 and initiate atorvastatin 40 mg daily.  Smoking cessation counseling provided.  No prior stroke history.  Evaluated by therapies who recommended outpatient PT and discharged home in stable condition.  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 The  punctate right cerebellum infarct could be synchronized small vessel etiology given uncontrolled risk factors, however, cardioembolic source is still in DDx  CT head right MCA frontoparietal infarct  CTA head and neck right ICA occlusion, right MCA attenuated flow, right A1 high-grade stenosis.  MRI right MCA and watershed area scattered infarcts.  Right cerebellum punctate infarct.  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 fib  LDL 118  HgbA1c 4.5  Hypercoagulable work-up negative except homocystine 15.9 slightly elevated likely due to smoking  UDS neg  lovenox for VTE prophylaxis  No antithrombotic prior to admission, now on aspirin 81.  Continue on discharge, no DAPT given small IVH.  Ongoing aggressive stroke risk factor management  Therapy recommendations: Outpatient PT  Disposition:  pending       ROS:   14 system review of systems performed and negative with exception of ***  PMH:  Past Medical History:  Diagnosis Date  . Hypertension     PSH:  Past Surgical History:  Procedure Laterality Date  . TONSILLECTOMY AND ADENOIDECTOMY      Social History:  Social History   Socioeconomic History  . Marital status: Single    Spouse name: Not on file  . Number of children: 2  . Years of education: HS diploma and some college  . Highest education level: Not on file  Occupational History  . Occupation: truck Hospital doctor    Comment: Robert [name] [employer]  . Smoking status: Current Every Day Smoker    Packs/day: 0.50    Types: Cigarettes  . Smokeless tobacco: Never Used  Substance and Sexual  Activity  . Alcohol use: Yes    Alcohol/week: 0.0 standard drinks  . Drug use: No  . Sexual activity: Not Currently  Other Topics Concern  . 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 Hypertension Mother   . Hypertension Father   . Hypertension Brother     Medications:   Current Outpatient Medications on File Prior to Visit  Medication Sig Dispense Refill  . amLODipine (NORVASC) 10 MG tablet TAKE 1 TABLET BY MOUTH DAILY. (Patient taking differently: Take 10 mg by mouth daily.) 30 tablet 2  . aspirin 81 MG chewable tablet Chew 81 mg by mouth daily.    Marland Kitchen aspirin EC 81 MG tablet Take 1 tablet (81 mg total) by mouth daily. Swallow whole. 30 tablet 1  . atorvastatin (LIPITOR) 40 MG tablet Take 1 tablet (40 mg total) by mouth daily. 30 tablet 1  . Blood Pressure Monitoring (PREMIUM AUTOMATIC BP MONITOR) DEVI 1 Device by Does not apply route daily. (Patient not taking: No sig reported) 1 Device 0  . gabapentin (NEURONTIN) 800 MG tablet TAKE 1 TABLET (800 MG TOTAL) BY MOUTH 2 (TWO) TIMES DAILY. (Patient taking differently: Take 800 mg by mouth 2 (two) times daily.) 60 tablet 1  . lisinopril-hydrochlorothiazide (ZESTORETIC) 20-12.5 MG tablet Take 1 tablet by mouth daily. 30 tablet 2  . nicotine (NICODERM CQ - DOSED IN MG/24 HOURS) 14 mg/24hr patch Place 1 patch (14 mg total) onto the skin daily. 28 patch 0   No current facility-administered medications on file prior to visit.    Allergies:  No Known Allergies    OBJECTIVE:  Physical Exam  There were no vitals filed for this visit. There is no height or weight on file to calculate BMI. No exam data present  Depression screen [facility] 2/9 10/31/2015  Decreased Interest 0  Down, Depressed, Hopeless 0  PHQ - 2 Score 0     General: well developed, well nourished, seated, in no evident  distress Head: head normocephalic and atraumatic.   Neck: supple with no carotid or supraclavicular bruits Cardiovascular: regular rate and rhythm, no murmurs Musculoskeletal: no deformity Skin:  no rash/petichiae Vascular:  Normal pulses all extremities   Neurologic Exam Mental Status: Awake and fully alert. Oriented to place and time. Recent and remote memory intact. Attention span, concentration and fund of knowledge appropriate. Mood and affect appropriate.  Cranial Nerves: Fundoscopic exam reveals sharp disc margins. Pupils equal, briskly reactive to light. Extraocular movements full without nystagmus. Visual fields full to confrontation. Hearing intact. Facial sensation intact. Face, tongue, palate moves normally and symmetrically.  Motor: Normal bulk and tone. Normal strength in all tested extremity muscles Sensory.: intact to touch , pinprick , position and vibratory sensation.  Coordination: Rapid alternating movements normal in all extremities. Finger-to-nose and heel-to-shin performed accurately bilaterally. Gait and Station: Arises from chair without difficulty. Stance is normal. Gait demonstrates normal stride length and balance with ***. Tandem walk and heel toe ***.  Reflexes: 1+ and symmetric. Toes downgoing.     NIHSS  ***  Modified Rankin  ***      ASSESSMENT: Axxel Gude is a 55 y.o. year old male presented with left arm and hand weakness over 66-month duration worsening 2 weeks prior to admission with left facial numbness on 07/09/2020 with stroke work-up revealing multifocal right MCA and watershed territory infarct likely secondary to right ICA occlusion as well as right cerebellum infarct possibly synchronized small vessel etiology given uncontrolled risk factors however cannot rule out cardioembolic source. Vascular risk factors include HTN, HLD, carotid occlusion, and tobacco use.      PLAN:  1. Right MCA infarct 2. Right cerebellar infarct: Residual deficit:  ***.  Cardiac monitor ***. continue aspirin 81 mg daily  and atorvastatin 40 mg daily for secondary stroke prevention.  Discussed secondary stroke prevention measures and importance of close PCP follow up for aggressive stroke risk factor management  3. HTN: BP goal <130/90.  Stable on *** per PCP 4. HLD: LDL goal <70. Recent LDL 118 -started atorvastatin 40 mg daily.  5. Carotid occlusion: CTA head/neck R ICA occluded at bulb -no intervention recommended.  Continue aspirin and statin -also discussed importance of aggressive stroke risk factor management and tobacco cessation 6. Tobacco use: 7. Cervical myelopathy: MRI c-spine C6-7 myelopathy with cervical stenosis felt to be cause of neck pain, shoulder pain and left arm/hand weakness followed by neurosurgery    Follow up in *** or call earlier if needed   CC:  [facility] provider: Dr. Pearlean Brownie PCP: Pcp, No    I spent *** minutes of face-to-face and non-face-to-face time with patient.  This included previsit chart review, lab review, study review, order entry, electronic health record documentation, patient education regarding recent stroke, residual deficits, importance of managing stroke risk factors and answered all questions to patient satisfaction     Ihor Austin, [facility]  [facility] [address] [place], [place] [address]  Phone [phone] Fax [phone] Note: This document was prepared with digital dictation and possible smart phrase technology. Any transcriptional errors that result from this process are unintentional.

## 2020-08-03 NOTE — Telephone Encounter (Signed)
-----   Message from Carlyle Overbey, RN sent at 08/03/2020  2:28 PM EDT ----- Sleep study has been ordered. Thanks!  

## 2020-08-03 NOTE — Telephone Encounter (Signed)
Discussed with Dr Shari Prows continue to monitor .Robert [name]

## 2020-08-03 NOTE — Telephone Encounter (Addendum)
[employer] sent critical report from 08/03/20 at 4:10 am CST :pause (4.6 sec) Bradyarrhythmia  Sinus Bradycardia with /VCD Per pt was asleep during this episode was unaware . Pt was seen in ED 3 days prior with pause as well Will continue to monitor . Will discuss with Dr Shari Prows .Zack Seal

## 2020-08-03 NOTE — Addendum Note (Signed)
Addended by: Theresia Majors on: 08/03/2020 02:27 PM   Modules accepted: Orders

## 2020-08-03 NOTE — Telephone Encounter (Signed)
Critical monitor report received from [employer] on 08/03/20 at 5:31am CST. Reports shows pause (7 sec), sinus bradycardia with 1st degree AV block/IVCD/Atrial Escape beat.  Spoke with the patient and he was unaware of the pause and reports to be asleep at this time.   Spoke with Dr. Graciela Husbands who will be seeing the patient on 6/20. Since pause was nocturnal we will continue to monitor and patient will need a sleep study. Dr Graciela Husbands has also discussed this patient with neurology.

## 2020-08-04 ENCOUNTER — Telehealth: Payer: Self-pay | Admitting: *Deleted

## 2020-08-04 NOTE — Telephone Encounter (Signed)
-----   Message from Theresia Majors, RN sent at 08/03/2020  2:28 PM EDT ----- Sleep study has been ordered. Thanks!

## 2020-08-04 NOTE — Telephone Encounter (Signed)
Patient currently has no insurance and is self pay. Sleep study order request will be sent to [facility] for scheduling.

## 2020-08-08 ENCOUNTER — Telehealth: Payer: Self-pay | Admitting: Cardiology

## 2020-08-08 ENCOUNTER — Telehealth: Payer: Self-pay | Admitting: *Deleted

## 2020-08-08 NOTE — Telephone Encounter (Signed)
[employer] day 18: Auto trigger 8:59 am  Pause 4.7 seconds, Bradyarrhythmia with artifact. Heart rate 20 to 40.    Spoke to patient he was sleeping during this event and was not woken up.   Dr. Shari Prows reviewed and signed. Continue to monitor.  Placed in box for scanning

## 2020-08-08 NOTE — Telephone Encounter (Addendum)
Faxed received.  5/31 5:22 am EST.  Auto trigger Brady arrhythmia, pause 3.1-4 seconds. Heart rate 20.   Spoke with patient and he was sleeping during event and was not woken up.   Dr. Shari Prows reviewed and signed. Continue to monitor.  Placed in box for scanning

## 2020-08-08 NOTE — Telephone Encounter (Signed)
Monitoring service called and pt with 2 pauses one 4 sec, and one 3.1 occurred 4:22 AM central standard time.  Has had before.  Will send to Dr. Graciela Husbands.

## 2020-08-09 ENCOUNTER — Telehealth: Payer: Self-pay | Admitting: *Deleted

## 2020-08-09 NOTE — Telephone Encounter (Signed)
Received faxed monitor reports-  Day 21-- 1:24 PM Central Time Pause 4.8 seconds, Bradyarrhythmia, Sinus Arrhythmia with PVCs (1 in 1 min)  Patient did not feel anything.  He was awake and sitting in bed.  Day 22- 3:21 AM Central Time Pause 5.4 seconds.  Patient was sleeping and did not feel anything

## 2020-08-09 NOTE — Telephone Encounter (Signed)
Strips reviewed by Dr Anne Fu (DOD)

## 2020-08-09 NOTE — Telephone Encounter (Signed)
Noted Thanks SK   

## 2020-08-12 ENCOUNTER — Telehealth: Payer: Self-pay | Admitting: Student

## 2020-08-12 NOTE — Telephone Encounter (Signed)
    Received a call from [employer] reporting the patient had 3 pauses overnight ranging from 4.4 seconds to 6.9 seconds. Was asymptomatic and sleeping at the time. By review of the chart, this is not new and he already has scheduled follow-up with Dr. Graciela Husbands. Strips will be faxed to the office for review. Will route as an FYI.   Signed, Ellsworth Lennox, PA-C 08/12/2020, 8:07 AM Pager: [phone]

## 2020-08-14 ENCOUNTER — Telehealth: Payer: Self-pay | Admitting: *Deleted

## 2020-08-14 NOTE — Telephone Encounter (Signed)
[employer] fax for Day 25 received:   08/12/20: 6:58 am Sinus Bradycardia, Bradyarrhythmia, Pause (4-4.7, 6.9 second)  BPM 10- 20   Will place in Dr. Graciela Husbands box for upcoming appointment.

## 2020-08-14 NOTE — Telephone Encounter (Addendum)
[employer] fax received for day 26   08/13/20:  7:19 am Sinus Bradycardia, Pause (4.3 seconds) with PVC's (2 in 1 min) BPM 48-47 8:24 am Pause (4.87 seconds), Sinus Arrhythmia with 1st degree heart block.  BPM 20-50  Spoke to the patient on 08/14/20 at 9:50 am he was asleep and was not woken up during these episodes.  Will place in Dr. Odessa Fleming box for up coming appointment and review.

## 2020-08-15 ENCOUNTER — Other Ambulatory Visit: Payer: Self-pay | Admitting: *Deleted

## 2020-08-15 NOTE — Patient Outreach (Signed)
[facility]) Care Management  08/15/2020  Robert [name] Feb 04, 1966 [id number]   Outgoing call placed to member, state he is doing well, still in the process of completing paperwork for disability and Medicaid.  He does have a lawyer working on the process with him.  Blood pressure and heart rate are reportedly doing well with the exception of when he is sleeping.  His heart monitor has picked up on several episodes of pauses, member report all has been when sleeping.  He has appointment with cardiology on 6/20, also scheduled for sleep study.  Will follow up with new PCP on 6/9, unfortunately had to cancel his neurology appointment due to inability to pay copay.  He will reschedule once Medicaid and disability have been approved.  Denies any further concerns, feel he has plan of care and will follow up with PCP and cardiology as noted above.  Will close case, advised to contact this care manager with questions.  Kemper Durie, [place], MSN [facility] Care Management  [facility] Manager [phone]

## 2020-08-17 ENCOUNTER — Other Ambulatory Visit: Payer: Self-pay

## 2020-08-17 ENCOUNTER — Ambulatory Visit (INDEPENDENT_AMBULATORY_CARE_PROVIDER_SITE_OTHER): Payer: Medicaid Other | Admitting: Primary Care

## 2020-08-17 ENCOUNTER — Encounter (INDEPENDENT_AMBULATORY_CARE_PROVIDER_SITE_OTHER): Payer: Self-pay | Admitting: Primary Care

## 2020-08-17 VITALS — BP 135/89 | HR 71 | Temp 97.5°F | Ht 72.0 in | Wt 182.8 lb

## 2020-08-17 DIAGNOSIS — F17210 Nicotine dependence, cigarettes, uncomplicated: Secondary | ICD-10-CM | POA: Diagnosis not present

## 2020-08-17 DIAGNOSIS — I639 Cerebral infarction, unspecified: Secondary | ICD-10-CM

## 2020-08-17 DIAGNOSIS — Z716 Tobacco abuse counseling: Secondary | ICD-10-CM

## 2020-08-17 DIAGNOSIS — M5412 Radiculopathy, cervical region: Secondary | ICD-10-CM | POA: Diagnosis not present

## 2020-08-17 DIAGNOSIS — Z7689 Persons encountering health services in other specified circumstances: Secondary | ICD-10-CM

## 2020-08-17 DIAGNOSIS — I10 Essential (primary) hypertension: Secondary | ICD-10-CM

## 2020-08-17 DIAGNOSIS — Z09 Encounter for follow-up examination after completed treatment for conditions other than malignant neoplasm: Secondary | ICD-10-CM

## 2020-08-17 NOTE — Progress Notes (Signed)
Pt complains that the left side of his face is numb  Unable to use left hand

## 2020-08-17 NOTE — Progress Notes (Signed)
[facility]   Subjective:   Robert [name] is a 55 y.o. male presents for ED follow up  and establish care. He went to the ED 07/31/20, patient was discharged from the hospital on 07/31/20, discharge diagnosis was bradycardia . Patient feels Bp drops too low and does not consitenly take BP medication. He is followed by cardiology and encourage to follow up with them.  He denies headaches, chest pain or lower extremity edema . He does have shortness of breath with any activity.  Past Medical History:  Diagnosis Date   Hypertension      No Known Allergies    Current Outpatient Medications on File Prior to Visit  Medication Sig Dispense Refill   amLODipine (NORVASC) 10 MG tablet TAKE 1 TABLET BY MOUTH DAILY. (Patient taking differently: Take 10 mg by mouth daily.) 30 tablet 2   aspirin EC 81 MG tablet Take 1 tablet (81 mg total) by mouth daily. Swallow whole. 30 tablet 1   atorvastatin (LIPITOR) 40 MG tablet Take 1 tablet (40 mg total) by mouth daily. 30 tablet 1   gabapentin (NEURONTIN) 800 MG tablet TAKE 1 TABLET (800 MG TOTAL) BY MOUTH 2 (TWO) TIMES DAILY. (Patient taking differently: Take 800 mg by mouth 2 (two) times daily.) 60 tablet 1   Blood Pressure Monitoring (PREMIUM AUTOMATIC BP MONITOR) DEVI 1 Device by Does not apply route daily. (Patient not taking: No sig reported) 1 Device 0   lisinopril-hydrochlorothiazide (ZESTORETIC) 20-12.5 MG tablet Take 1 tablet by mouth daily. (Patient not taking: Reported on 08/17/2020) 30 tablet 2   No current facility-administered medications on file prior to visit.     Review of System: Review of Systems  Respiratory:  Positive for shortness of breath.   Neurological:  Positive for tingling and weakness.       Cervical states has a slip disc recently started having numbness and unable to use hand for 33months since CVA Left side of face numb and at times unable to talk  All other systems reviewed and are negative.  Objective:   BP 135/89 (BP Location: Right Arm, Patient Position: Sitting, Cuff Size: Normal)   Pulse 71   Temp (!) 97.5 F (36.4 C) (Temporal)   Ht 6' (1.829 m)   Wt 182 lb 12.8 oz (82.9 kg)   SpO2 98%   BMI 24.79 kg/m   Filed Weights   08/17/20 1333  Weight: 182 lb 12.8 oz (82.9 kg)    Physical Exam: General Appearance: Well nourished, in no apparent distress. Eyes: PERRLA, EOMs, conjunctiva no swelling or erythema Sinuses: No Frontal/maxillary tenderness ENT/Mouth: Ext aud canals clear, TMs without erythema, bulging. Left side of face swollen and eye. Hearing normal.  Neck: Supple, thyroid normal.  Respiratory: Respiratory effort normal, BS equal bilaterally without rales, rhonchi, wheezing or stridor.  Cardio: RRR with no MRGs. Brisk peripheral pulses without edema.  Abdomen: Soft, + BS.  Non tender, no guarding, rebound, hernias, masses. Lymphatics: Non tender without lymphadenopathy.  Musculoskeletal: Full ROM, 3/5 left side 5/5 right side strength, normal gait.  Skin: Warm, dry without rashes, lesions, ecchymosis.  Neuro: Cranial nerves intact. Normal muscle tone, no cerebellar symptoms. Sensation intact.  Psych: Awake and oriented X 3, normal affect, Insight and Judgment appropriate.    Assessment:  Robert [name] was seen today for hospitalization follow-up.  Diagnoses and all orders for this visit:  Cervical radiculopathy [facility] 03/13/20  1. Cervical radiculopathy MR SPINE CERVICAL WO CONTRAST  2. Left upper extremity  numbness NCV/EMG/Ultrasound  3. Cervical spondylosis   Cerebrovascular accident (CVA), unspecified mechanism (HCC) With recent changes numbness and ting left side and difficulty using hand refer back to neurology   Primary hypertension Non compliant with medication refer back to cardiology Cherene Julian   Encounter to establish care Establish care with new PCP  Hospital discharge follow-up  discharge diagnosis was bradycardia . Patient feels Bp  drops too low and does not consitenly take BP medication. He is followed by cardiology and encourage to follow up with them.  Copied discharge summary did not do any follow up. Information provided.   Tobacco abuse counseling 1ppd he has nicotine patches that he has not used. Expressed urgency to stop smoking increases risk for another CVA or heart attack   This note has been created with Education officer, environmental. Any transcriptional errors are unintentional.   Grayce Sessions, NP 08/17/2020, 1:42 PM

## 2020-08-17 NOTE — Patient Instructions (Addendum)
Follow-Ups  Follow up with Dawley, Alan Mulder, DO in 4 weeks (08/09/2020) Follow up with [facility] (Rehabilitation); the outpatient therapy will contact you for the first appointment Follow up with [facility] (Family Medicine) on 08/17/2020; Your appointment time is at 1:30 pm. please arrive early and bring: picture ID, current meds.  To be seen with repeat labs (CBC & BMP). Schedule an appointment with [facility]; MDs office will call you to arrange heart monitor.  Please call them back if you do not hear from them in 2-3 business days. Schedule an appointment with [facility] (Neurology) in 4 weeks (08/09/2020)

## 2020-08-18 LAB — CBC WITH DIFFERENTIAL/PLATELET
Basophils Absolute: 0 10*3/uL (ref 0.0–0.2)
Basos: 0 %
EOS (ABSOLUTE): 0.1 10*3/uL (ref 0.0–0.4)
Eos: 1 %
Hematocrit: 39 % (ref 37.5–51.0)
Hemoglobin: 13.6 g/dL (ref 13.0–17.7)
Immature Grans (Abs): 0 10*3/uL (ref 0.0–0.1)
Immature Granulocytes: 0 %
Lymphocytes Absolute: 2 10*3/uL (ref 0.7–3.1)
Lymphs: 21 %
MCH: 32.6 pg (ref 26.6–33.0)
MCHC: 34.9 g/dL (ref 31.5–35.7)
MCV: 94 fL (ref 79–97)
Monocytes Absolute: 0.5 10*3/uL (ref 0.1–0.9)
Monocytes: 6 %
Neutrophils Absolute: 6.6 10*3/uL (ref 1.4–7.0)
Neutrophils: 72 %
Platelets: 257 10*3/uL (ref 150–450)
RBC: 4.17 x10E6/uL (ref 4.14–5.80)
RDW: 11.6 % (ref 11.6–15.4)
WBC: 9.3 10*3/uL (ref 3.4–10.8)

## 2020-08-18 LAB — BASIC METABOLIC PANEL
BUN/Creatinine Ratio: 11 (ref 9–20)
BUN: 13 mg/dL (ref 6–24)
CO2: 24 mmol/L (ref 20–29)
Calcium: 9.2 mg/dL (ref 8.7–10.2)
Chloride: 110 mmol/L — ABNORMAL HIGH (ref 96–106)
Creatinine, Ser: 1.15 mg/dL (ref 0.76–1.27)
Glucose: 90 mg/dL (ref 65–99)
Potassium: 4.2 mmol/L (ref 3.5–5.2)
Sodium: 146 mmol/L — ABNORMAL HIGH (ref 134–144)
eGFR: 75 mL/min/{1.73_m2} (ref >59)

## 2020-08-21 ENCOUNTER — Other Ambulatory Visit: Payer: Self-pay | Admitting: Cardiology

## 2020-08-21 DIAGNOSIS — I4891 Unspecified atrial fibrillation: Secondary | ICD-10-CM

## 2020-08-21 DIAGNOSIS — I639 Cerebral infarction, unspecified: Secondary | ICD-10-CM

## 2020-08-21 NOTE — Telephone Encounter (Signed)
Continues with nocturnal pauses

## 2020-08-24 ENCOUNTER — Inpatient Hospital Stay: Payer: No Typology Code available for payment source | Admitting: Adult Health

## 2020-08-24 NOTE — Telephone Encounter (Signed)
yes

## 2020-08-28 ENCOUNTER — Ambulatory Visit: Payer: Self-pay | Admitting: Internal Medicine

## 2020-08-28 NOTE — Telephone Encounter (Signed)
You may want to ask the ordering provider if HST ok due to self pay.   Is home sleep test ok, patient has no insurance   Duke Salvia, MD to Me    yes

## 2020-08-28 NOTE — Telephone Encounter (Signed)
Called patient did not get an answer and no vm set up. No dpr on file.

## 2020-08-29 ENCOUNTER — Other Ambulatory Visit: Payer: Self-pay

## 2020-09-05 ENCOUNTER — Telehealth: Payer: Self-pay | Admitting: Internal Medicine

## 2020-09-05 ENCOUNTER — Other Ambulatory Visit: Payer: Self-pay

## 2020-09-05 NOTE — Telephone Encounter (Signed)
Patient following up on sleep study. See phone note from 08/04/20

## 2020-09-08 ENCOUNTER — Other Ambulatory Visit (HOSPITAL_COMMUNITY): Payer: Self-pay

## 2020-09-08 ENCOUNTER — Other Ambulatory Visit: Payer: Self-pay | Admitting: Physician Assistant

## 2020-09-08 MED FILL — Amlodipine Besylate Tab 10 MG (Base Equivalent): ORAL | 30 days supply | Qty: 30 | Fill #1 | Status: CN

## 2020-09-12 ENCOUNTER — Other Ambulatory Visit (HOSPITAL_COMMUNITY): Payer: Self-pay

## 2020-09-12 NOTE — Telephone Encounter (Addendum)
FW: needs sleep study per Dr. Graciela Husbands  per Dr. Cindie Laroche - Pt does not have insurance at this time   Thanks,   Mindi Junker  ----- Message -----  From: Loa Socks, LPN  Sent: 08/07/509   9:54 AM EDT  To: Gaynelle Cage, CMA, Reesa Chew, CMA, *  Subject: needs sleep study per Dr. Graciela Husbands                 Dr. Graciela Husbands ordered for this pt to have a sleep study done when he saw him in the hospital.  Pt states he hasn't heard from anyone about scheduling this.  Order was placed and there by Dr. Graciela Husbands.  Can you please call the pt and arrange and let Dr. Odessa Fleming RN know when scheduled?    Patient states he is self pay until his medicaid comes through. Patient is scheduled for lab study on 09/29/2020. Patient understands his sleep study will be done at [facility] sleep lab. Patient understands he will receive a sleep packet in a week or so. Patient understands to call if he does not receive the sleep packet in a timely manner. Patient agrees with treatment and thanked me for call.

## 2020-09-14 NOTE — Telephone Encounter (Signed)
Patient states he is self pay until his medicaid comes through. Patient is scheduled for lab study on 09/29/2020. Patient agrees with treatment and thanked me for call.

## 2020-09-18 ENCOUNTER — Other Ambulatory Visit (HOSPITAL_COMMUNITY): Payer: Self-pay

## 2020-09-20 ENCOUNTER — Ambulatory Visit (INDEPENDENT_AMBULATORY_CARE_PROVIDER_SITE_OTHER): Payer: Medicaid Other | Admitting: Adult Health

## 2020-09-20 ENCOUNTER — Other Ambulatory Visit: Payer: Self-pay

## 2020-09-20 ENCOUNTER — Encounter: Payer: Self-pay | Admitting: Adult Health

## 2020-09-20 VITALS — BP 126/75 | HR 62 | Ht 72.0 in | Wt 183.0 lb

## 2020-09-20 DIAGNOSIS — I10 Essential (primary) hypertension: Secondary | ICD-10-CM

## 2020-09-20 DIAGNOSIS — I63231 Cerebral infarction due to unspecified occlusion or stenosis of right carotid arteries: Secondary | ICD-10-CM | POA: Diagnosis not present

## 2020-09-20 DIAGNOSIS — G959 Disease of spinal cord, unspecified: Secondary | ICD-10-CM | POA: Diagnosis not present

## 2020-09-20 DIAGNOSIS — E785 Hyperlipidemia, unspecified: Secondary | ICD-10-CM

## 2020-09-20 DIAGNOSIS — Z720 Tobacco use: Secondary | ICD-10-CM

## 2020-09-20 DIAGNOSIS — I639 Cerebral infarction, unspecified: Secondary | ICD-10-CM | POA: Diagnosis not present

## 2020-09-20 DIAGNOSIS — M5412 Radiculopathy, cervical region: Secondary | ICD-10-CM

## 2020-09-20 MED ORDER — ATORVASTATIN CALCIUM 40 MG PO TABS
40.0000 mg | ORAL_TABLET | Freq: Every day | ORAL | 3 refills | Status: DC
  Filled 2020-09-20: qty 30, 30d supply, fill #0

## 2020-09-20 MED FILL — Amlodipine Besylate Tab 10 MG (Base Equivalent): ORAL | 30 days supply | Qty: 30 | Fill #1 | Status: AC

## 2020-09-20 NOTE — Progress Notes (Signed)
[facility] [address] [place]. [place] [address] [phone]       HOSPITAL FOLLOW UP NOTE  [name] Date of Birth:  Jun 03, 1965 Medical Record Number:  [id number]   Reason for Referral:  hospital stroke follow up    SUBJECTIVE:   CHIEF COMPLAINT:  Chief Complaint  Patient presents with   Follow-up    RM 3 alone Pt is well, having L sided facial/tongue numbness, L sided weakness and buckles when he is on it for a while, gets SOB easier and gets blurried vision occatuional     HPI:   [name] is a 55 y.o. male with history of hypertension, hyperlipidemia, smoker admitted on 07/09/2020 for left arm hand weakness over the past 6 months, worsening for [redacted] weeks along with left facial numbness.  Personally reviewed hospitalization pertinent progress notes, lab work and imaging summary provided.  Evaluated by Dr. 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  Recommended 30-day cardiac event monitor outpatient to rule out A. fib.  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  Recommend initiating aspirin 81 mg daily for secondary stroke prevention -DAPT not recommended given small IVH. 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  EF 55 to 60%.  No no intervention recommended further right ICA occlusion as evidenced on CTA.  Elevated homocystine level likely due to tobacco use.  Hypercoagulable work-up negative.  HTN stable on high end will monitor BP normotensive range.  LDL 118 -initiate atorvastatin 40 mg  daily.  A1c 5.4.  Current tobacco use with smoking cessation counseling provided.  Evaluated by therapies and recommended outpatient PT.  Today, 09/20/2020, Robert [name] is being seen for hospital follow-up unaccompanied.  Greatest concern today is in regards to continued left neck/shoulder pain as well as left arm weakness and left facial numbness.  He also reports fluctuation of LLE weakness especially towards the end of the day or when he is fatigued.  He is also been experiencing mild dyspnea on exertion and occasional blurred vision which was present prior to his recent stroke.  He has not participated in therapies and is hesitant to do so as he feels left-sided pain worsens with activity or motion.  He has not yet been seen by neurosurgery since discharge.  Denies new stroke/TIA symptoms.  Reports compliance on aspirin 81 mg daily without associated side effects.  He was compliant on atorvastatin but recently ran out of prescription -denies any side effects while taking.  Blood pressure today satisfactory 126/75.  Routinely monitors at home and reports low blood pressure when he takes both amlodipine and lisinopril-hydrochlorothiazide but blood pressure elevated if he takes only one of the medications.  He does have an appointment with cardiology on 7/20.  He plans on completing HST 7/22 for suspected underlying sleep apnea espcially with cardiac monitor showing several pauses mostly occurring while he was sleeping - has appt with Dr. Graciela Husbands on 8/8 to discuss further.  Continued tobacco use.  No further concerns at this time.      ROS:   14 system review of systems performed  and negative with exception of those listed in HPI  PMH:  Past Medical History:  Diagnosis Date   Hypertension     PSH:  Past Surgical History:  Procedure Laterality Date   TONSILLECTOMY AND ADENOIDECTOMY      Social History:  Social History   Socioeconomic History   Marital status: Single    Spouse name: Not on  file   Number of children: 2   Years of education: HS diploma and some college   Highest education level: Not on file  Occupational History   Occupation: truck driver    Comment: [employer]  Tobacco Use   Smoking status: Every Day    Packs/day: 0.50    Pack years: 0.00    Types: Cigarettes   Smokeless tobacco: Never  Substance and Sexual Activity   Alcohol use: Yes    Alcohol/week: 0.0 standard drinks   Drug use: No   Sexual activity: Not Currently  Other Topics Concern   Not on file  Social History Narrative   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 - [employer]   Single   Social Determinants of Health   Financial Resource Strain: Not on file  Food Insecurity: Not on file  Transportation Needs: Not on file  Physical Activity: Not on file  Stress: Not on file  Social Connections: Not on file  Intimate Partner Violence: Not on file    Family History:  Family History  Problem Relation Age of Onset   Hypertension Mother    Hypertension Father    Hypertension Brother     Medications:   Current Outpatient Medications on File Prior to Visit  Medication Sig Dispense Refill   amLODipine (NORVASC) 10 MG tablet TAKE 1 TABLET BY MOUTH DAILY. (Patient taking differently: Take 10 mg by mouth daily.) 30 tablet 2   aspirin EC 81 MG tablet Take 1 tablet (81 mg total) by mouth daily. Swallow whole. 30 tablet 1   atorvastatin (LIPITOR) 40 MG tablet Take 1 tablet (40 mg total) by mouth daily. 30 tablet 1   Blood Pressure Monitoring (PREMIUM AUTOMATIC BP MONITOR) DEVI 1 Device by Does not apply route daily. 1 Device 0   gabapentin (NEURONTIN) 800 MG tablet TAKE 1 TABLET (800 MG TOTAL) BY MOUTH 2 (TWO) TIMES DAILY. (Patient taking differently: Take 800 mg by mouth 2 (two) times daily.) 60 tablet 1   lisinopril-hydrochlorothiazide (ZESTORETIC) 20-12.5 MG tablet Take 1 tablet by mouth daily. 30 tablet 2   No current facility-administered medications on file prior to  visit.    Allergies:  No Known Allergies    OBJECTIVE:  Physical Exam  Vitals:   09/20/20 1012  Weight: 183 lb (83 kg)  Height: 6' (1.829 m)   Body mass index is 24.82 kg/m. No results found.  General: well developed, well nourished, pleasant middle-aged African-American male, seated, in no evident distress Head: head normocephalic and atraumatic.   Neck: supple with no carotid or supraclavicular bruits Cardiovascular: regular rate and rhythm, no murmurs Musculoskeletal: no deformity Skin:  no rash/petichiae Vascular:  Normal pulses all extremities   Neurologic Exam Mental Status: Awake and fully alert.  Fluent speech and language.  Oriented to place and time. Recent and remote memory intact. Attention span, concentration and fund of knowledge appropriate. Mood and affect appropriate.  Cranial Nerves: Fundoscopic exam reveals sharp disc margins. Pupils equal, briskly reactive to light. Extraocular movements full without nystagmus. Visual fields full to confrontation. Hearing intact. Facial sensation slightly decreased left  cheek. Face, tongue, palate moves normally and symmetrically.  Motor: Normal strength, bulk and tone in right upper and lower extremity as well as left lower extremity.  Left upper extremity decreased strength bicep and tricep and weak grip strength with increased tone Sensory.: intact to touch , pinprick , position and vibratory sensation Coordination: Rapid alternating movements normal in all extremities except left hand. Finger-to-nose performed accurately RUE and heel-to-shin performed accurately bilaterally. Gait and Station: Arises from chair without difficulty. Stance is normal. Gait demonstrates normal stride length and balance without use of assistive device. Tandem walk and heel toe moderate difficulty.  Reflexes: 1+ and symmetric. Toes downgoing.     NIHSS  1 Modified Rankin  1      ASSESSMENT: Robert [name] is a 55 y.o. 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  Vascular risk factors include HTN, HLD, right carotid occlusion, incidental finding of small IVH, and cervical myelopathy.      PLAN:  R MCA stroke, cerebellar stroke:  Residual deficit: LUE weakness although weakness possibly in setting of cervical myelopathy and subjective fluctuating LLE weakness.  Declines interest in therapy for residual stroke deficits as he is concerned therapy/exercise will worsen LUE pain 30-day cardiac event monitor showed frequent nocturnal pauses -has evaluation with EP Dr. Graciela Husbands 8/8 for further evaluation Continue aspirin 81 mg daily  and atorvastatin 40 mg daily for secondary stroke prevention.   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  Previously seen by neurosurgery outpatient but no further interventions completed due to lack of insurance -he is currently Medicaid pending.  Advised him to contact prior neurosurgeon office for follow-up Carotid occlusion: Discussed ongoing use of aspirin and statin as well as importance of tobacco cessation. Will plan on carotid ultrasound around 01/2021 for surveillance monitoring HTN: BP goal <130/90.  Stable on current regimen per PCP HLD: LDL goal <70. Recent LDL 118.  Continue atorvastatin 40 mg daily -refill provided per request further refills by PCP Tobacco use: Discussed importance of complete tobacco cessation and educated on risk factors with continued use.  Patient verbalized understanding and  will follow up with PCP when ready to quit if further assistance is needed.    Follow up in 4 months or call earlier if needed   CC:  [facility] provider: Dr. Pearlean Brownie PCP: Grayce Sessions, NP    I spent 56 minutes of face-to-face and non-face-to-face time with patient.  This included previsit chart review including review of recent hospitalization, lab review, study review, order entry, electronic health record documentation, and patient education and discussion regarding recent stroke including etiology, secondary stroke prevention measures and aggressive stroke risk factor management, residual deficits and likely further recovery, cervical myelopathy and follow-up with neurosurgery and answered all questions to patient satisfaction   Ihor Austin, AGNP-BC  [facility] [address] [place], [place] [address]  Phone [phone] Fax [phone] Note: This document was prepared with digital dictation and possible smart phrase technology. Any transcriptional errors that result from this process are unintentional.

## 2020-09-20 NOTE — Patient Instructions (Addendum)
Ensure follow up with neurosurgery for re-evaluation of your continued cervical neck issues - their office number is [phone]   If/when you would like to participate in therapies regarding your residual stroke symptoms, please let me know  Continue aspirin 81 mg daily  and atorvastatin for secondary stroke prevention  Continue to follow up with PCP regarding cholesterol and blood pressure management  Maintain strict control of hypertension with blood pressure goal below 130/90 and cholesterol with LDL cholesterol (bad cholesterol) goal below 70 mg/dL.   If your blood pressure continues to be too low after taking both your blood pressure medications (amlodipine and lisinopril-hydrochlorothiazide) would recommend you try to break amlodipine 10mg  tablet in half to see if this helps -please ensure you continue to monitor your blood pressure and ensure you follow-up with cardiology as scheduled in August       Followup in the future with me in 4 months or call earlier if needed       Thank you for coming to see [facility] at [facility]. I hope we have been able to provide you high quality care today.  You may receive a patient satisfaction survey over the next few weeks. We would appreciate your feedback and comments so that we may continue to improve ourselves and the health of our patients.

## 2020-09-21 ENCOUNTER — Inpatient Hospital Stay: Payer: Self-pay | Admitting: Adult Health

## 2020-09-21 NOTE — Progress Notes (Signed)
I agree with the above plan 

## 2020-09-22 ENCOUNTER — Other Ambulatory Visit: Payer: Self-pay

## 2020-09-25 NOTE — Progress Notes (Deleted)
Cardiology Office Note:    Date:  09/25/2020   ID:  Robert [name], DOB January 12, 1966, MRN [id number]  PCP:  Grayce Sessions, NP   [facility] Providers Cardiologist:  None {   Referring MD: No ref. provider found     History of Present Illness:    Robert [name] is a 55 y.o. male with a hx of HTN, HLD, tobacco abuse and recent CVA who was found to have sinus pauses on cardiac monitor who now presents to clinic for follow-up.  Patient was admitted from 07/09/20-07/12/20 after presenting with left handed weakness, slurred speech and facial droop. MRI on admission showed scattered generally small cortical and subcortical white matter infarcts in the right MCA territory with some involvement in the lateral right PCA/watershed area.  There are also small infarct in the right cerebellum.  CT angiogram showed abrupt occlusion of right ICA just distal to the bifurcation. He was initially placed on dual antiplatelet therapy with aspirin and Plavix however on the MRI it was also evident some additional petechial hemorrhage in the posterior right MCA territory and small volume intraventricular hemorrhage in the fourth ventricle which was not not apparent in the original CT.  His dual antiplatelet therapy was then held and he was ultimately discharged on aspirin. Hypercoagulable panel including vitamin B12, anticardiolipin antibodies, lupus anticoagulant, beta-2 glycoprotein antibodies, ANA and RPR were negative.   After his stroke, a 30day monitor was placed for monitoring of Afib. His monitor showed several episodes of sinus pauses lasting up to 8.6 seconds. Thought to be sleeping at that time. He denied any lightheadedness, dizziness, syncope. We referred him to the ER at that time and was seen by Dr. Graciela Husbands who recommended sleep study and continued monitoring.   Today,  Past Medical History:  Diagnosis Date   Hypertension     Past Surgical History:  Procedure Laterality Date   TONSILLECTOMY AND  ADENOIDECTOMY      Current Medications: No outpatient medications have been marked as taking for the 09/27/20 encounter (Appointment) with Meriam Sprague, MD.     Allergies:   Patient has no known allergies.   Social History   Socioeconomic History   Marital status: Single    Spouse name: Not on file   Number of children: 2   Years of education: HS diploma and some college   Highest education level: Not on file  Occupational History   Occupation: truck driver    Comment: [employer]  Tobacco Use   Smoking status: Every Day    Packs/day: 0.50    Types: Cigarettes   Smokeless tobacco: Never  Substance and Sexual Activity   Alcohol use: Yes    Alcohol/week: 0.0 standard drinks   Drug use: No   Sexual activity: Not Currently  Other Topics Concern   Not on file  Social History Narrative   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  ROS:   Please see the history of present illness.    *** All other systems reviewed and are negative.  EKGs/Labs/Other Studies Reviewed:    The following studies were reviewed  today: Cardiac Monitor 07/31/20: Patient's monitoring period was from 07/19/20-08/17/20 Predominant rhythm was sinus with average HR 74bpm (ranging from 33-137bpm) There were frequent pauses lasting >3 seconds (39 total) with longest lasting 8.6 seconds on 07/31/20 at 7:08am (patient called and reportedly asleep at this time). Most episodes occurred while sleeping, however, did have two episodes that occurred during the daytime one at 8:32am 5/29 and another at 1:24pm on 5/31 (no patient  trigger at these times) One episode of possible AFlutter on 08/10/20 lasting Patient triggered events correlated with sinus rhythm with PACs or PVCs Rare VE (<1%), occasional SVE (4%) Patient referred to EP for further management  TTE 07/10/20: IMPRESSIONS     1. Left ventricular ejection fraction, by estimation, is 55 to 60%. The  left ventricle has normal function. The left ventricle has no regional  wall motion abnormalities. Left ventricular diastolic parameters are  consistent with Grade I diastolic  dysfunction (impaired relaxation).   2. Right ventricular systolic function is normal. The right ventricular  size is normal. There is normal pulmonary artery systolic pressure. The  estimated right ventricular systolic pressure is 27.2 mmHg.   3. Negative bubble study, no evidence for PFO or ASD.   4. The mitral valve is normal in structure. Trivial mitral valve  regurgitation. No evidence of mitral stenosis.   5. The aortic valve is tricuspid. Aortic valve regurgitation is not  visualized. No aortic stenosis is present.   6. The inferior vena cava is normal in size with greater than 50%  respiratory variability, suggesting right atrial pressure of 3 mmHg. EKG:  EKG is *** ordered today.  The ekg ordered today demonstrates ***  Recent Labs: 07/09/2020: ALT 16 07/10/2020: TSH 0.552 08/17/2020: BUN 13; Creatinine, Ser 1.15; Hemoglobin 13.6; Platelets 257; Potassium 4.2; Sodium 146  Recent Lipid Panel    Component Value Date/Time   CHOL 185 07/10/2020 0019   TRIG 120 07/10/2020 0019   HDL 43 07/10/2020 0019   CHOLHDL 4.3 07/10/2020 0019   VLDL 24 07/10/2020 0019   LDLCALC 118 (H) 07/10/2020 0019     Risk Assessment/Calculations:   {Does this patient have ATRIAL FIBRILLATION?:[phone]}       Physical Exam:    VS:  There were no vitals taken for this visit.    Wt Readings from Last 3 Encounters:  09/20/20 183 lb (83 kg)  08/17/20 182 lb 12.8 oz (82.9 kg)  04/26/20  170 lb (77.1 kg)     GEN: *** Well nourished, well developed in no acute distress HEENT: Normal NECK: No JVD; No carotid bruits LYMPHATICS: No lymphadenopathy CARDIAC: ***RRR, no murmurs, rubs, gallops RESPIRATORY:  Clear to auscultation without rales, wheezing or rhonchi  ABDOMEN: Soft, non-tender, non-distended MUSCULOSKELETAL:  No edema; No deformity  SKIN: Warm and dry NEUROLOGIC:  Alert and oriented x 3 PSYCHIATRIC:  Normal affect   ASSESSMENT:    No diagnosis found. PLAN:    In order of problems listed above:  #Sinus Pauses: Lasting up to 8.6 seconds while sleeping. No lightheadedness, dizziness, syncope, chest pain or SOB. TTE with LVEF 55-60%, G1DD, no valve disease. Not on nodal agents. -Follow-up with EP -Check sleep study -Avoid nodal agents  #Right MCA and Cerebellar Stroke: Admitted from 07/09/20-07/12/20 after presenting with left handed weakness, slurred speech and facial droop. MRI on admission showed scattered generally small cortical and subcortical white matter infarcts in the right MCA territory with some involvement in the lateral right PCA/watershed area.  There are also small  infarct in the right cerebellum.  CT angiogram showed abrupt occlusion of right ICA just distal to the bifurcation. He was initially placed on dual antiplatelet therapy with aspirin and Plavix however on the MRI it was also evident some additional petechial hemorrhage in the posterior right MCA territory and small volume intraventricular hemorrhage in the fourth ventricle which was not not apparent in the original CT.  His dual antiplatelet therapy was then held and he was ultimately discharged on aspirin and statin. Has residual left sided weakness. No Afib on monitor. Follows with Neuro. 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  -Continue ASA 81mg  daily -Continue atorvastatin 40mg   daily  #HTN: -Continue amlodipine 10mg  daily -Continue lisinopril-HCTZ 20-12.5mg  daily  #Tobacco use: -Encourage cessation   {Are you ordering a CV Procedure (e.g. stress test, cath, DCCV, TEE, etc)?   Press F2        :    Medication Adjustments/Labs and Tests Ordered: Current medicines are reviewed at length with the patient today.  Concerns regarding medicines are outlined above.  No orders of the defined types were placed in this encounter.  No orders of the defined types were placed in this encounter.   There are no Patient Instructions on file for this visit.   Signed, , MD  09/25/2020 8:22 PM    [facility]

## 2020-09-27 ENCOUNTER — Ambulatory Visit: Payer: Medicaid Other | Admitting: Cardiology

## 2020-09-27 ENCOUNTER — Other Ambulatory Visit: Payer: Self-pay

## 2020-09-27 ENCOUNTER — Ambulatory Visit: Payer: Self-pay | Admitting: Cardiology

## 2020-09-27 ENCOUNTER — Encounter: Payer: Self-pay | Admitting: Cardiology

## 2020-09-27 VITALS — BP 144/84 | HR 65 | Ht 72.0 in | Wt 181.6 lb

## 2020-09-27 DIAGNOSIS — Z720 Tobacco use: Secondary | ICD-10-CM | POA: Diagnosis not present

## 2020-09-27 DIAGNOSIS — I6521 Occlusion and stenosis of right carotid artery: Secondary | ICD-10-CM

## 2020-09-27 DIAGNOSIS — Z0189 Encounter for other specified special examinations: Secondary | ICD-10-CM

## 2020-09-27 DIAGNOSIS — I639 Cerebral infarction, unspecified: Secondary | ICD-10-CM

## 2020-09-27 DIAGNOSIS — R072 Precordial pain: Secondary | ICD-10-CM

## 2020-09-27 DIAGNOSIS — R0609 Other forms of dyspnea: Secondary | ICD-10-CM

## 2020-09-27 DIAGNOSIS — R0600 Dyspnea, unspecified: Secondary | ICD-10-CM | POA: Diagnosis not present

## 2020-09-27 DIAGNOSIS — I455 Other specified heart block: Secondary | ICD-10-CM | POA: Diagnosis not present

## 2020-09-27 LAB — BASIC METABOLIC PANEL
BUN/Creatinine Ratio: 14 (ref 9–20)
BUN: 16 mg/dL (ref 6–24)
CO2: 26 mmol/L (ref 20–29)
Calcium: 9.2 mg/dL (ref 8.7–10.2)
Chloride: 101 mmol/L (ref 96–106)
Creatinine, Ser: 1.13 mg/dL (ref 0.76–1.27)
Glucose: 89 mg/dL (ref 65–99)
Potassium: 3.6 mmol/L (ref 3.5–5.2)
Sodium: 141 mmol/L (ref 134–144)
eGFR: 76 mL/min/{1.73_m2} (ref >59)

## 2020-09-27 NOTE — H&P (View-Only) (Signed)
Cardiology Office Note:    Date:  09/27/2020   ID:  Robert [name], DOB [birth year], MRN [id number]  PCP:  Grayce Sessions, NP   [facility] Providers Cardiologist:  None {   Referring MD: Grayce Sessions, NP     History of Present Illness:    Robert [name] is a 55 y.o. male with a hx of HTN, HLD, tobacco abuse and recent CVA who was found to have sinus pauses on cardiac monitor who now presents to clinic for follow-up.  Patient was admitted from 07/09/20-07/12/20 after presenting with left handed weakness, slurred speech and facial droop. MRI on admission showed scattered generally small cortical and subcortical white matter infarcts in the right MCA territory with some involvement in the lateral right PCA/watershed area.  There are also small infarct in the right cerebellum.  CT angiogram showed abrupt occlusion of right ICA just distal to the bifurcation. He was initially placed on dual antiplatelet therapy with aspirin and Plavix however on the MRI it was also evident some additional petechial hemorrhage in the posterior right MCA territory and small volume intraventricular hemorrhage in the fourth ventricle which was not not apparent in the original CT.  His dual antiplatelet therapy was then held and he was ultimately discharged on aspirin. Hypercoagulable panel including vitamin B12, anticardiolipin antibodies, lupus anticoagulant, beta-2 glycoprotein antibodies, ANA and RPR were negative.   After his stroke, a 30day monitor was placed for monitoring of Afib. His monitor showed several episodes of sinus pauses lasting up to 8.6 seconds. Thought to be sleeping at that time. He denied any lightheadedness, dizziness, syncope. We referred him to the ER and was seen by Dr. Graciela Husbands who recommended sleep study and continued monitoring. He unfortunately has missed several follow-up appointments. Sleep study still pending.   Today, the patient states that he continues to have residual  deficits from his stroke with intermittent left leg weakness, blurred vision, left sided facial numbness, and weakness in this left hand. Has been planned to see NSGY for concern for nerve compression from a slipped disc. Was unable to see sooner due to lack of insurance.   Otherwise, he has been having episodes of lightheadedness and weakness with exertion or when in the sun for a prolonged period. He finds himself needing to sit down in order to feel back to normal and sometimes it takes him several minutes to feel normal again. Also, having episodes of chest heaviness and SOB with exertion. These symptoms seem to be progressing such that he had to stop to rest after walking from his car into [employer]. No syncope. No palpitations, orthopnea, PND or LE edema. Has not been told he snores. States blood pressure at home has been running 130s at home. Continues to smoke 1ppd but is trying to quit.   Planned for sleep study later this week and EP follow-up with Dr. Graciela Husbands in August.   Past Medical History:  Diagnosis Date   Hypertension     Past Surgical History:  Procedure Laterality Date   TONSILLECTOMY AND ADENOIDECTOMY      Current Medications: Current Meds  Medication Sig   amLODipine (NORVASC) 10 MG tablet TAKE 1 TABLET BY MOUTH DAILY.   aspirin EC 81 MG tablet Take 1 tablet (81 mg total) by mouth daily. Swallow whole.   atorvastatin (LIPITOR) 40 MG tablet Take 1 tablet (40 mg total) by mouth daily.   Blood Pressure Monitoring (PREMIUM AUTOMATIC BP MONITOR) DEVI 1 Device by Does not apply route  daily.   lisinopril-hydrochlorothiazide (ZESTORETIC) 20-12.5 MG tablet Take 1 tablet by mouth daily.     Allergies:   Patient has no known allergies.   Social History   Socioeconomic History   Marital status: Single    Spouse name: Not on file   Number of children: 2   Years of education: HS diploma and some college   Highest education level: Not on file  Occupational History   Occupation:  truck driver    Comment: [employer]  Tobacco Use   Smoking status: Every Day    Packs/day: 0.50    Types: Cigarettes   Smokeless tobacco: Never  Substance and Sexual Activity   Alcohol use: Yes    Alcohol/week: 0.0 standard drinks   Drug use: No   Sexual activity: Not Currently  Other Topics Concern   Not on file  Social History Narrative   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  ROS:   Please see the history of present illness.    Review of Systems  Constitutional:  Positive for malaise/fatigue. Negative for chills and fever.  HENT:  Negative for hearing loss.   Eyes:  Positive for blurred vision.  Respiratory:  Positive for shortness of breath.   Cardiovascular:  Positive for chest pain. Negative for palpitations, orthopnea, claudication, leg swelling and PND.  Gastrointestinal:  Negative for melena, nausea and vomiting.  Genitourinary:  Negative for hematuria.  Musculoskeletal:  Positive for back pain, joint pain and myalgias.  Neurological:  Positive for dizziness, tingling, sensory change and focal weakness. Negative for loss of consciousness.  Psychiatric/Behavioral:  Positive for substance abuse (Tobacco abuse).     EKGs/Labs/Other Studies Reviewed:    The following studies were reviewed today: Cardiac Monitor 07/31/20: Patient's monitoring period was from 07/19/20-08/17/20 Predominant rhythm was sinus with average HR 74bpm (ranging from 33-137bpm) There were frequent pauses lasting >3 seconds (39 total) with longest lasting 8.6 seconds on 07/31/20 at 7:08am (patient called and reportedly  asleep at this time). Most episodes occurred while sleeping, however, did have two episodes that occurred during the daytime one at 8:32am 5/29 and another at 1:24pm on 5/31 (no patient trigger at these times) One episode of possible AFlutter on 08/10/20 lasting Patient triggered events correlated with sinus rhythm with PACs or PVCs Rare VE (<1%), occasional SVE (4%) Patient referred to EP for further management  TTE 07/10/20: IMPRESSIONS   1. Left ventricular ejection fraction, by estimation, is 55 to 60%. The  left ventricle has normal function. The left ventricle has no regional  wall motion abnormalities. Left ventricular diastolic parameters are  consistent with Grade I diastolic  dysfunction (impaired relaxation).   2. Right ventricular systolic function is normal. The right ventricular  size is normal. There is normal pulmonary artery systolic pressure. The  estimated right ventricular systolic pressure is 27.2 mmHg.   3. Negative bubble study, no evidence for PFO or ASD.   4. The mitral valve is normal in structure. Trivial mitral valve  regurgitation. No evidence of mitral stenosis.   5. The aortic valve is tricuspid. Aortic valve regurgitation is  not  visualized. No aortic stenosis is present.   6. The inferior vena cava is normal in size with greater than 50%  respiratory variability, suggesting right atrial pressure of 3 mmHg.  EKG:  EKG not performed today  Recent Labs: 07/09/2020: ALT 16 07/10/2020: TSH 0.552 08/17/2020: BUN 13; Creatinine, Ser 1.15; Hemoglobin 13.6; Platelets 257; Potassium 4.2; Sodium 146  Recent Lipid Panel    Component Value Date/Time   CHOL 185 07/10/2020 0019   TRIG 120 07/10/2020 0019   HDL 43 07/10/2020 0019   CHOLHDL 4.3 07/10/2020 0019   VLDL 24 07/10/2020 0019   LDLCALC 118 (H) 07/10/2020 0019          Physical Exam:    VS:  BP (!) 144/84   Pulse 65   Ht 6' (1.829 m)   Wt 181 lb 9.6 oz (82.4 kg)   SpO2 98%   BMI 24.63 kg/m      Wt Readings from Last 3 Encounters:  09/27/20 181 lb 9.6 oz (82.4 kg)  09/20/20 183 lb (83 kg)  08/17/20 182 lb 12.8 oz (82.9 kg)     GEN:  Well nourished, well developed in no acute distress HEENT: Normal NECK: No JVD; No carotid bruits CARDIAC: RRR, no murmurs, rubs, gallops RESPIRATORY:  Clear to auscultation without rales, wheezing or rhonchi  ABDOMEN: Soft, non-tender, non-distended MUSCULOSKELETAL:  No edema; No deformity  SKIN: Warm and dry NEUROLOGIC:  Alert and oriented x 3 PSYCHIATRIC:  Normal affect   ASSESSMENT:    1. Precordial pain   2. Encounter for laboratory examination   3. Acute CVA (cerebrovascular accident) (HCC)   4. Sinus pause   5. Occlusion of right carotid artery   6. Tobacco abuse   7. Dyspnea on exertion    PLAN:    In order of problems listed above:  #Sinus Pauses: Lasting up to 8.6 seconds while sleeping. No lightheadedness, dizziness, syncope, chest pain or SOB. TTE with LVEF 55-60%, G1DD, no valve disease. Not on nodal agents. -Follow-up with EP (Dr. Graciela HusbandsKlein) as scheduled -Check sleep study; scheduled later this week -Avoid nodal agents  #Exertional Chest Pain and SOB: Patient states he has exertional chest tightness and dyspnea on exertion with minimal activity. Symptoms resolve with rest. Has several risk factors for ischemia including HTN, HLD, known PVD and current tobacco abuse. Will check coronary CTA as resting HR 60s. NO BETA BLOCKER. -Check coronary CTA  (resting HR 60s) -No beta blocker due to pauses -If needed, can change to myoview if HR too high on day of testing  #Right MCA and Cerebellar Stroke: Admitted from 07/09/20-07/12/20 after presenting with left handed weakness, slurred speech and facial droop. MRI on admission showed scattered generally small cortical and subcortical white matter infarcts in the right MCA territory with some involvement in the lateral right PCA/watershed area.  There are also small infarct in the right  cerebellum.  CT angiogram showed abrupt occlusion of right ICA just distal to the bifurcation. He was initially placed on dual antiplatelet therapy with aspirin and Plavix however on the MRI it was also evident some additional petechial hemorrhage in the posterior right MCA territory and small volume intraventricular hemorrhage in the fourth ventricle which was not not apparent in the original CT.  His dual antiplatelet therapy was then held and he was ultimately discharged on aspirin and statin. Has residual left sided weakness. No Afib on monitor. Follows with Neuro. 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  -Continue ASA 81mg  daily -Continue atorvastatin 40mg  daily  #HTN: A little elevated today. Will monitor at home.  -Continue amlodipine 10mg  daily -Continue lisinopril-HCTZ 20-12.5mg  daily  #Tobacco use: Continues to smoke 1ppd. Trying to quit. -Encourage cessation -Continue nicotine patches        Medication Adjustments/Labs and Tests Ordered: Current medicines are reviewed at length with the patient today.  Concerns regarding medicines are outlined above.  Orders Placed This Encounter  Procedures   CT CORONARY MORPH W/CTA COR W/SCORE W/CA W/CM &/OR WO/CM   Basic metabolic panel    No orders of the defined types were placed in this encounter.   Patient Instructions  Medication Instructions:   Your physician recommends that you continue on your current medications as directed. Please refer to the Current Medication list given to you today.  *If you need a refill on your cardiac medications before your next appointment, please call your pharmacy*   Lab Work:  TODAY--BMET  If you have labs (blood work) drawn today and your tests are completely normal, you will receive your results only by: MyChart Message (if you have MyChart) OR A paper copy in the mail If you  have any lab test that is abnormal or we need to change your treatment, we will call you to review the results.   Testing/Procedures:    Your cardiac CT will be scheduled at one of the below locations:   [facility] [address] [place], [facility] [address] [phone]  If scheduled at [facility], please arrive at the [facility] main entrance (entrance A) of [facility] 30 minutes prior to test start time. Proceed to the [facility] Radiology Department (first floor) to check-in and test prep.  Please follow these instructions carefully (unless otherwise directed):  Hold all erectile dysfunction medications at least 3 days (72 hrs) prior to test.  On the Night Before the Test: Be sure to Drink plenty of water. Do not consume any caffeinated/decaffeinated beverages or chocolate 12 hours prior to your test. Do not take any antihistamines 12 hours prior to your test.  On the Day of the Test: Drink plenty of water until 1 hour prior to the test. Do not eat any food 4 hours prior to the test. You may take your regular medications prior to the test.  HOLD ZESTORETIC morning of the test.  After the Test: Drink plenty of water. After receiving IV contrast, you may experience a mild flushed feeling. This is normal. On occasion, you may experience a mild rash up to 24 hours after the test. This is not dangerous. If this occurs, you can take Benadryl 25 mg and increase your fluid intake. If you experience trouble breathing, this can be serious. If it is severe call 911 IMMEDIATELY. If it is mild, please call our office. If you take any of these medications: Glipizide/Metformin, Avandament, Glucavance, please do not take 48 hours after completing test unless otherwise instructed.  Please allow 2-4 weeks for scheduling of routine cardiac CTs. Some insurance companies require a pre-authorization which may delay scheduling of this test.   For non-scheduling  related questions, please contact the cardiac imaging nurse navigator should you have any questions/concerns: [facility], Cardiac Imaging Nurse Navigator [facility], Cardiac Imaging Nurse Navigator [facility] Direct Office Dial: [phone]   For scheduling needs, including cancellations and rescheduling, please call [facility], [phone].  Follow-Up:  3-5 MONTHS IN THE OFFICE WITH AN EXTENDER    Signed, Meriam Sprague, MD  09/27/2020 10:38 AM    [facility]

## 2020-09-27 NOTE — Progress Notes (Signed)
Cardiology Office Note:    Date:  09/27/2020   ID:  Woodward Ku, DOB [birth year], MRN [id number]  PCP:  Grayce Sessions, NP   [facility] Providers Cardiologist:  None {   Referring MD: Grayce Sessions, NP     History of Present Illness:    Robert [name] is a 55 y.o. male with a hx of HTN, HLD, tobacco abuse and recent CVA who was found to have sinus pauses on cardiac monitor who now presents to clinic for follow-up.  Patient was admitted from 07/09/20-07/12/20 after presenting with left handed weakness, slurred speech and facial droop. MRI on admission showed scattered generally small cortical and subcortical white matter infarcts in the right MCA territory with some involvement in the lateral right PCA/watershed area.  There are also small infarct in the right cerebellum.  CT angiogram showed abrupt occlusion of right ICA just distal to the bifurcation. He was initially placed on dual antiplatelet therapy with aspirin and Plavix however on the MRI it was also evident some additional petechial hemorrhage in the posterior right MCA territory and small volume intraventricular hemorrhage in the fourth ventricle which was not not apparent in the original CT.  His dual antiplatelet therapy was then held and he was ultimately discharged on aspirin. Hypercoagulable panel including vitamin B12, anticardiolipin antibodies, lupus anticoagulant, beta-2 glycoprotein antibodies, ANA and RPR were negative.   After his stroke, a 30day monitor was placed for monitoring of Afib. His monitor showed several episodes of sinus pauses lasting up to 8.6 seconds. Thought to be sleeping at that time. He denied any lightheadedness, dizziness, syncope. We referred him to the ER and was seen by Dr. Graciela Husbands who recommended sleep study and continued monitoring. He unfortunately has missed several follow-up appointments. Sleep study still pending.   Today, the patient states that he continues to have residual  deficits from his stroke with intermittent left leg weakness, blurred vision, left sided facial numbness, and weakness in this left hand. Has been planned to see NSGY for concern for nerve compression from a slipped disc. Was unable to see sooner due to lack of insurance.   Otherwise, he has been having episodes of lightheadedness and weakness with exertion or when in the sun for a prolonged period. He finds himself needing to sit down in order to feel back to normal and sometimes it takes him several minutes to feel normal again. Also, having episodes of chest heaviness and SOB with exertion. These symptoms seem to be progressing such that he had to stop to rest after walking from his car into [employer]. No syncope. No palpitations, orthopnea, PND or LE edema. Has not been told he snores. States blood pressure at home has been running 130s at home. Continues to smoke 1ppd but is trying to quit.   Planned for sleep study later this week and EP follow-up with Dr. Graciela Husbands in August.   Past Medical History:  Diagnosis Date   Hypertension     Past Surgical History:  Procedure Laterality Date   TONSILLECTOMY AND ADENOIDECTOMY      Current Medications: Current Meds  Medication Sig   amLODipine (NORVASC) 10 MG tablet TAKE 1 TABLET BY MOUTH DAILY.   aspirin EC 81 MG tablet Take 1 tablet (81 mg total) by mouth daily. Swallow whole.   atorvastatin (LIPITOR) 40 MG tablet Take 1 tablet (40 mg total) by mouth daily.   Blood Pressure Monitoring (PREMIUM AUTOMATIC BP MONITOR) DEVI 1 Device by Does not apply route  daily.   lisinopril-hydrochlorothiazide (ZESTORETIC) 20-12.5 MG tablet Take 1 tablet by mouth daily.     Allergies:   Patient has no known allergies.   Social History   Socioeconomic History   Marital status: Single    Spouse name: Not on file   Number of children: 2   Years of education: HS diploma and some college   Highest education level: Not on file  Occupational History   Occupation:  truck driver    Comment: [employer]  Tobacco Use   Smoking status: Every Day    Packs/day: 0.50    Types: Cigarettes   Smokeless tobacco: Never  Substance and Sexual Activity   Alcohol use: Yes    Alcohol/week: 0.0 standard drinks   Drug use: No   Sexual activity: Not Currently  Other Topics Concern   Not on file  Social History Narrative   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  ROS:   Please see the history of present illness.    Review of Systems  Constitutional:  Positive for malaise/fatigue. Negative for chills and fever.  HENT:  Negative for hearing loss.   Eyes:  Positive for blurred vision.  Respiratory:  Positive for shortness of breath.   Cardiovascular:  Positive for chest pain. Negative for palpitations, orthopnea, claudication, leg swelling and PND.  Gastrointestinal:  Negative for melena, nausea and vomiting.  Genitourinary:  Negative for hematuria.  Musculoskeletal:  Positive for back pain, joint pain and myalgias.  Neurological:  Positive for dizziness, tingling, sensory change and focal weakness. Negative for loss of consciousness.  Psychiatric/Behavioral:  Positive for substance abuse (Tobacco abuse).     EKGs/Labs/Other Studies Reviewed:    The following studies were reviewed today: Cardiac Monitor 07/31/20: Patient's monitoring period was from 07/19/20-08/17/20 Predominant rhythm was sinus with average HR 74bpm (ranging from 33-137bpm) There were frequent pauses lasting >3 seconds (39 total) with longest lasting 8.6 seconds on 07/31/20 at 7:08am (patient called and reportedly  asleep at this time). Most episodes occurred while sleeping, however, did have two episodes that occurred during the daytime one at 8:32am 5/29 and another at 1:24pm on 5/31 (no patient trigger at these times) One episode of possible AFlutter on 08/10/20 lasting 2min Patient triggered events correlated with sinus rhythm with PACs or PVCs Rare VE (<1%), occasional SVE (4%) Patient referred to EP for further management  TTE 07/10/20: IMPRESSIONS   1. Left ventricular ejection fraction, by estimation, is 55 to 60%. The  left ventricle has normal function. The left ventricle has no regional  wall motion abnormalities. Left ventricular diastolic parameters are  consistent with Grade I diastolic  dysfunction (impaired relaxation).   2. Right ventricular systolic function is normal. The right ventricular  size is normal. There is normal pulmonary artery systolic pressure. The  estimated right ventricular systolic pressure is 27.2 mmHg.   3. Negative bubble study, no evidence for PFO or ASD.   4. The mitral valve is normal in structure. Trivial mitral valve  regurgitation. No evidence of mitral stenosis.   5. The aortic valve is tricuspid. Aortic valve regurgitation is   not  visualized. No aortic stenosis is present.   6. The inferior vena cava is normal in size with greater than 50%  respiratory variability, suggesting right atrial pressure of 3 mmHg.  EKG:  EKG not performed today  Recent Labs: 07/09/2020: ALT 16 07/10/2020: TSH 0.552 08/17/2020: BUN 13; Creatinine, Ser 1.15; Hemoglobin 13.6; Platelets 257; Potassium 4.2; Sodium 146  Recent Lipid Panel    Component Value Date/Time   CHOL 185 07/10/2020 0019   TRIG 120 07/10/2020 0019   HDL 43 07/10/2020 0019   CHOLHDL 4.3 07/10/2020 0019   VLDL 24 07/10/2020 0019   LDLCALC 118 (H) 07/10/2020 0019          Physical Exam:    VS:  BP (!) 144/84   Pulse 65   Ht 6' (1.829 m)   Wt 181 lb 9.6 oz (82.4 kg)   SpO2 98%   BMI 24.63 kg/m      Wt Readings from Last 3 Encounters:  09/27/20 181 lb 9.6 oz (82.4 kg)  09/20/20 183 lb (83 kg)  08/17/20 182 lb 12.8 oz (82.9 kg)     GEN:  Well nourished, well developed in no acute distress HEENT: Normal NECK: No JVD; No carotid bruits CARDIAC: RRR, no murmurs, rubs, gallops RESPIRATORY:  Clear to auscultation without rales, wheezing or rhonchi  ABDOMEN: Soft, non-tender, non-distended MUSCULOSKELETAL:  No edema; No deformity  SKIN: Warm and dry NEUROLOGIC:  Alert and oriented x 3 PSYCHIATRIC:  Normal affect   ASSESSMENT:    1. Precordial pain   2. Encounter for laboratory examination   3. Acute CVA (cerebrovascular accident) (HCC)   4. Sinus pause   5. Occlusion of right carotid artery   6. Tobacco abuse   7. Dyspnea on exertion    PLAN:    In order of problems listed above:  #Sinus Pauses: Lasting up to 8.6 seconds while sleeping. No lightheadedness, dizziness, syncope, chest pain or SOB. TTE with LVEF 55-60%, G1DD, no valve disease. Not on nodal agents. -Follow-up with EP (Dr. Graciela HusbandsKlein) as scheduled -Check sleep study; scheduled later this week -Avoid nodal agents  #Exertional Chest Pain and SOB: Patient states he has exertional chest tightness and dyspnea on exertion with minimal activity. Symptoms resolve with rest. Has several risk factors for ischemia including HTN, HLD, known PVD and current tobacco abuse. Will check coronary CTA as resting HR 60s. NO BETA BLOCKER. -Check coronary CTA  (resting HR 60s) -No beta blocker due to pauses -If needed, can change to myoview if HR too high on day of testing  #Right MCA and Cerebellar Stroke: Admitted from 07/09/20-07/12/20 after presenting with left handed weakness, slurred speech and facial droop. MRI on admission showed scattered generally small cortical and subcortical white matter infarcts in the right MCA territory with some involvement in the lateral right PCA/watershed area.  There are also small infarct in the right  cerebellum.  CT angiogram showed abrupt occlusion of right ICA just distal to the bifurcation. He was initially placed on dual antiplatelet therapy with aspirin and Plavix however on the MRI it was also evident some additional petechial hemorrhage in the posterior right MCA territory and small volume intraventricular hemorrhage in the fourth ventricle which was not not apparent in the original CT.  His dual antiplatelet therapy was then held and he was ultimately discharged on aspirin and statin. Has residual left sided weakness. No Afib on monitor. Follows with Neuro. 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  -Continue ASA 81mg  daily -Continue atorvastatin 40mg  daily  #HTN: A little elevated today. Will monitor at home.  -Continue amlodipine 10mg  daily -Continue lisinopril-HCTZ 20-12.5mg  daily  #Tobacco use: Continues to smoke 1ppd. Trying to quit. -Encourage cessation -Continue nicotine patches        Medication Adjustments/Labs and Tests Ordered: Current medicines are reviewed at length with the patient today.  Concerns regarding medicines are outlined above.  Orders Placed This Encounter  Procedures   CT CORONARY MORPH W/CTA COR W/SCORE W/CA W/CM &/OR WO/CM   Basic metabolic panel    No orders of the defined types were placed in this encounter.   Patient Instructions  Medication Instructions:   Your physician recommends that you continue on your current medications as directed. Please refer to the Current Medication list given to you today.  *If you need a refill on your cardiac medications before your next appointment, please call your pharmacy*   Lab Work:  TODAY--BMET  If you have labs (blood work) drawn today and your tests are completely normal, you will receive your results only by: MyChart Message (if you have MyChart) OR A paper copy in the mail If you  have any lab test that is abnormal or we need to change your treatment, we will call you to review the results.   Testing/Procedures:    Your cardiac CT will be scheduled at one of the below locations:   [facility] [address] [place], [facility] [address] [phone]  If scheduled at [facility], please arrive at the [facility] main entrance (entrance A) of [facility] 30 minutes prior to test start time. Proceed to the [facility] Radiology Department (first floor) to check-in and test prep.  Please follow these instructions carefully (unless otherwise directed):  Hold all erectile dysfunction medications at least 3 days (72 hrs) prior to test.  On the Night Before the Test: Be sure to Drink plenty of water. Do not consume any caffeinated/decaffeinated beverages or chocolate 12 hours prior to your test. Do not take any antihistamines 12 hours prior to your test.  On the Day of the Test: Drink plenty of water until 1 hour prior to the test. Do not eat any food 4 hours prior to the test. You may take your regular medications prior to the test.  HOLD ZESTORETIC morning of the test.  After the Test: Drink plenty of water. After receiving IV contrast, you may experience a mild flushed feeling. This is normal. On occasion, you may experience a mild rash up to 24 hours after the test. This is not dangerous. If this occurs, you can take Benadryl 25 mg and increase your fluid intake. If you experience trouble breathing, this can be serious. If it is severe call 911 IMMEDIATELY. If it is mild, please call our office. If you take any of these medications: Glipizide/Metformin, Avandament, Glucavance, please do not take 48 hours after completing test unless otherwise instructed.  Please allow 2-4 weeks for scheduling of routine cardiac CTs. Some insurance companies require a pre-authorization which may delay scheduling of this test.   For non-scheduling  related questions, please contact the cardiac imaging nurse navigator should you have any questions/concerns: [facility], Cardiac Imaging Nurse Navigator [facility], Cardiac Imaging Nurse Navigator [facility] Direct Office Dial: [phone]   For scheduling needs, including cancellations and rescheduling, please call [facility], [phone].  Follow-Up:  3-5 MONTHS IN THE OFFICE WITH AN EXTENDER    Signed, Meriam Sprague, MD  09/27/2020 10:38 AM    [facility]

## 2020-09-27 NOTE — Patient Instructions (Signed)
Medication Instructions:   Your physician recommends that you continue on your current medications as directed. Please refer to the Current Medication list given to you today.  *If you need a refill on your cardiac medications before your next appointment, please call your pharmacy*   Lab Work:  TODAY--BMET  If you have labs (blood work) drawn today and your tests are completely normal, you will receive your results only by: MyChart Message (if you have MyChart) OR A paper copy in the mail If you have any lab test that is abnormal or we need to change your treatment, we will call you to review the results.   Testing/Procedures:    Your cardiac CT will be scheduled at one of the below locations:   [facility] [address] [place], [place] [address] [phone]  If scheduled at [facility], please arrive at the [facility] [facility] main entrance (entrance A) of [facility] 30 minutes prior to test start time. Proceed to the [facility] Radiology Department (first floor) to check-in and test prep.  Please follow these instructions carefully (unless otherwise directed):  Hold all erectile dysfunction medications at least 3 days (72 hrs) prior to test.  On the Night Before the Test: Be sure to Drink plenty of water. Do not consume any caffeinated/decaffeinated beverages or chocolate 12 hours prior to your test. Do not take any antihistamines 12 hours prior to your test.  On the Day of the Test: Drink plenty of water until 1 hour prior to the test. Do not eat any food 4 hours prior to the test. You may take your regular medications prior to the test.  HOLD ZESTORETIC morning of the test.  After the Test: Drink plenty of water. After receiving IV contrast, you may experience a mild flushed feeling. This is normal. On occasion, you may experience a mild rash up to 24 hours after the test. This is not dangerous. If this occurs, you can take Benadryl 25  mg and increase your fluid intake. If you experience trouble breathing, this can be serious. If it is severe call 911 IMMEDIATELY. If it is mild, please call our office. If you take any of these medications: Glipizide/Metformin, Avandament, Glucavance, please do not take 48 hours after completing test unless otherwise instructed.  Please allow 2-4 weeks for scheduling of routine cardiac CTs. Some insurance companies require a pre-authorization which may delay scheduling of this test.   For non-scheduling related questions, please contact the cardiac imaging nurse navigator should you have any questions/concerns: [facility], Cardiac Imaging Nurse Navigator Larey Brick, Cardiac Imaging Nurse Navigator [facility] Direct Office Dial: [phone]   For scheduling needs, including cancellations and rescheduling, please call [place], [phone].   Follow-Up:  3-5 MONTHS IN THE OFFICE WITH AN EXTENDER

## 2020-09-29 ENCOUNTER — Ambulatory Visit (HOSPITAL_BASED_OUTPATIENT_CLINIC_OR_DEPARTMENT_OTHER): Payer: Medicaid Other | Admitting: Cardiology

## 2020-10-02 ENCOUNTER — Telehealth (HOSPITAL_COMMUNITY): Payer: Self-pay | Admitting: *Deleted

## 2020-10-02 NOTE — Telephone Encounter (Signed)
Attempted to call patient regarding upcoming cardiac CT appointment. °Left message on voicemail with name and callback number ° °Nitzia Perren RN Navigator Cardiac Imaging °[facility] °[phone] Office °[phone] Cell ° °

## 2020-10-04 ENCOUNTER — Ambulatory Visit (HOSPITAL_COMMUNITY)
Admission: RE | Admit: 2020-10-04 | Discharge: 2020-10-04 | Disposition: A | Discharge status: Home / Self Care | Payer: Medicaid Other | Source: Ambulatory Visit | Attending: Cardiovascular Disease | Admitting: Cardiovascular Disease

## 2020-10-04 ENCOUNTER — Other Ambulatory Visit: Payer: Self-pay

## 2020-10-04 ENCOUNTER — Ambulatory Visit (HOSPITAL_COMMUNITY)
Admission: RE | Admit: 2020-10-04 | Discharge: 2020-10-04 | Disposition: A | Discharge status: Home / Self Care | Payer: Medicaid Other | Source: Ambulatory Visit | Attending: Cardiology | Admitting: Cardiology

## 2020-10-04 ENCOUNTER — Other Ambulatory Visit: Payer: Self-pay | Admitting: Cardiovascular Disease

## 2020-10-04 DIAGNOSIS — I639 Cerebral infarction, unspecified: Secondary | ICD-10-CM

## 2020-10-04 DIAGNOSIS — R931 Abnormal findings on diagnostic imaging of heart and coronary circulation: Secondary | ICD-10-CM

## 2020-10-04 DIAGNOSIS — R079 Chest pain, unspecified: Secondary | ICD-10-CM

## 2020-10-04 DIAGNOSIS — I455 Other specified heart block: Secondary | ICD-10-CM

## 2020-10-04 DIAGNOSIS — I2510 Atherosclerotic heart disease of native coronary artery without angina pectoris: Secondary | ICD-10-CM | POA: Diagnosis not present

## 2020-10-04 DIAGNOSIS — R072 Precordial pain: Secondary | ICD-10-CM | POA: Diagnosis present

## 2020-10-04 IMAGING — CT CT HEART MORP W/ CTA COR W/ SCORE W/ CA W/CM &/OR W/O CM
4 of 7 series · 8 of 20 positions shown, 9 images · non-contrast
Comparison: None.

Addendum:
CLINICAL DATA: Chest pain

EXAM:
Cardiac/Coronary CTA
TECHNIQUE: A non-contrast, gated CT scan was obtained with axial slices of 3 mm
through the heart for calcium scoring. Calcium scoring was performed
using the Agatston method. A 100 kV prospective, gated, contrast
cardiac scan was obtained. Gantry rotation speed was 250 msecs and
collimation was 0.6 mm. Two sublingual nitroglycerin tablets (0.8
mg) were given. The 3D data set was reconstructed in 5% intervals of
the 35-75% of the R-R cycle. Diastolic phases were analyzed on a
dedicated workstation using MPR, MIP, and VRT modes. The patient
received 95 cc of contrast.

[Series 6: best diast 70 % · axial · 0.39mm/px · z∈[+1278,+1323]mm · 2 of 338 slices shown]
[im 113/338  vessel]
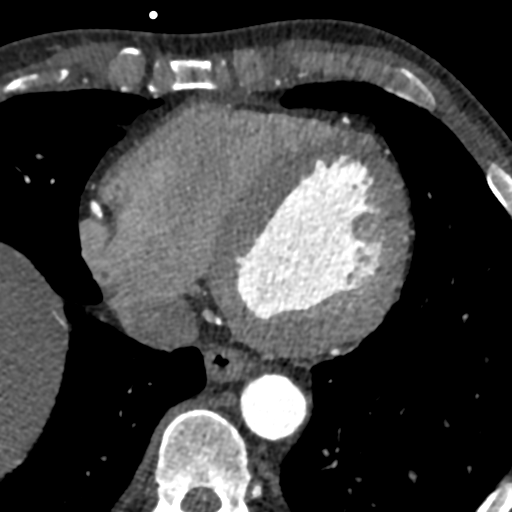
[im 225/338  vessel]
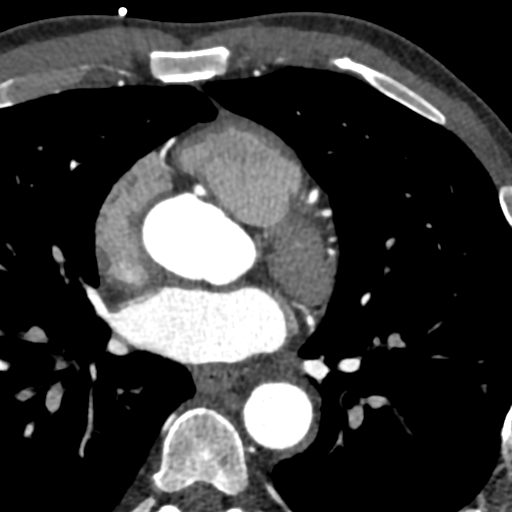

[Series 7: best syst · axial · 0.39mm/px · z∈[+1278,+1323]mm · 2 of 338 slices shown, 3 images]
[im 113/338  vessel]
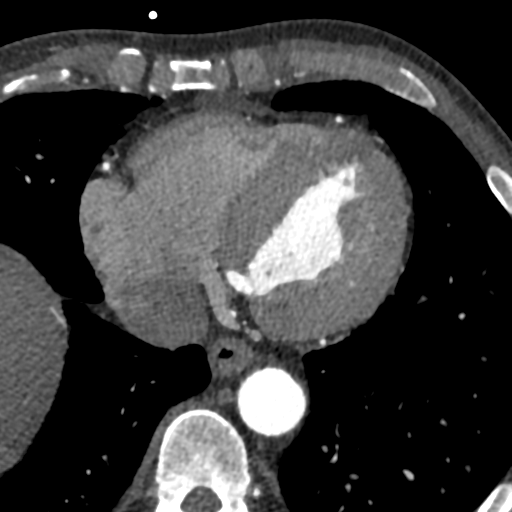
[im 113/338  lung]
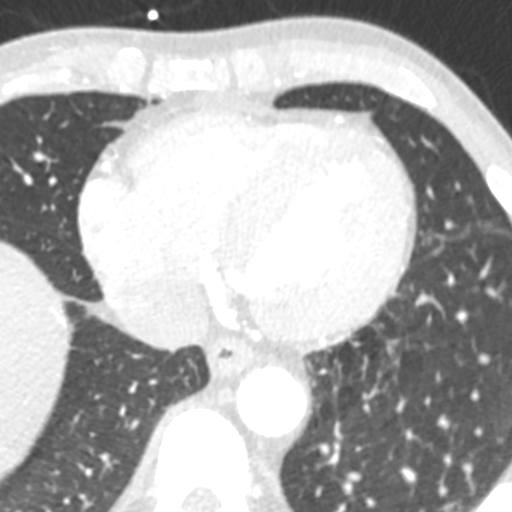
[im 225/338  vessel]
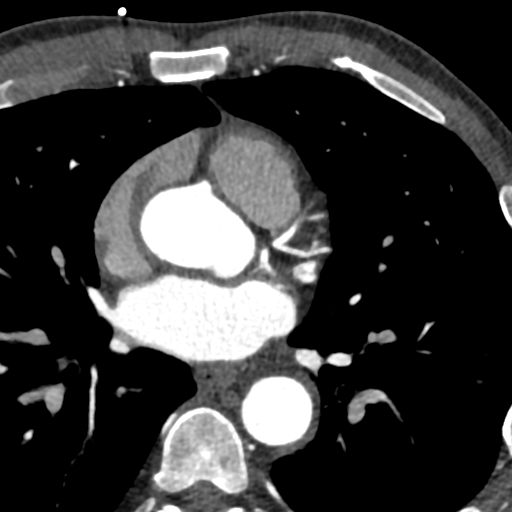

[Series 8: ts diast sharp 70 % · axial · 0.39mm/px · z∈[+1278,+1323]mm · 2 of 338 slices shown]
[im 113/338  lung]
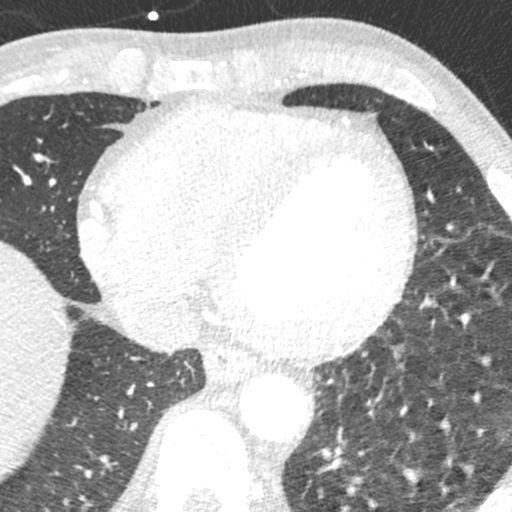
[im 225/338  lung]
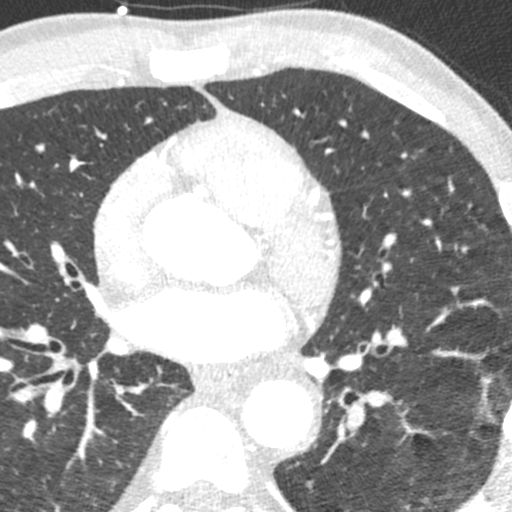

[Series 9: ts syst sharp · axial · 0.39mm/px · z∈[+1278,+1323]mm · 2 of 338 slices shown]
[im 113/338  lung]
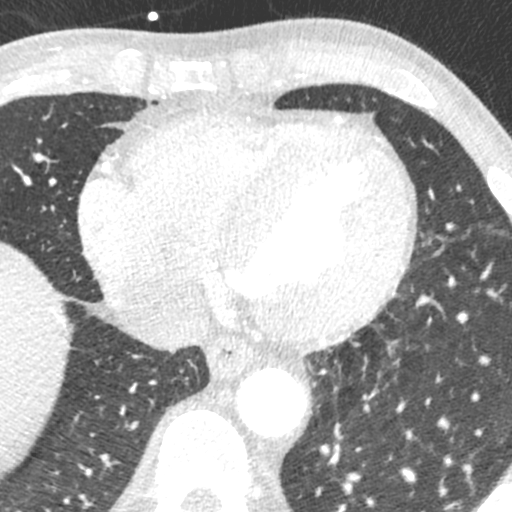
[im 225/338  lung]
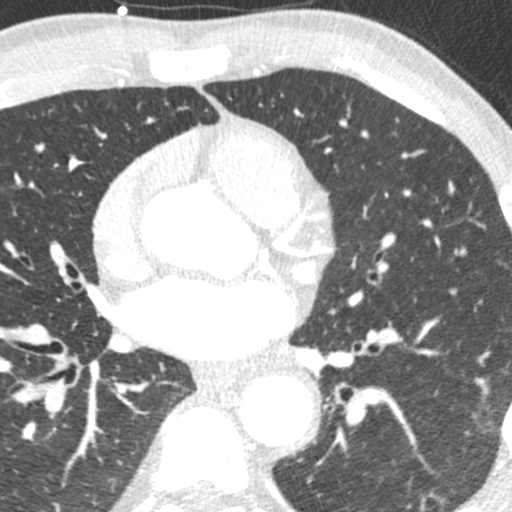

[8 of 20 positions shown; findings below may reference images not displayed]

FINDINGS: Image quality: Excellent.

Noise artifact is: Limited.

Coronary Arteries:  Normal coronary origin.  Right dominance.

Left main: The left main is a large caliber vessel with a normal
take off from the left coronary cusp that bifurcates to form a left
anterior descending artery and a left circumflex artery. There is no
plaque or stenosis.

Left anterior descending artery: The proximal LAD contains minimal
non-calcified plaque (<25%). The mid LAD contains mild non-calcified
plaque (25-49%). The distal LAD is patent. The LAD gives off 3
patent diagonal branches.

Left circumflex artery: The LCX is non-dominant and contains minimal
non-calcified plaque (<25%). The LCX gives off 1 patent obtuse
marginal branch.

Right coronary artery: The RCA is dominant with normal take off from
the right coronary cusp. The proximal RCA contains a moderate
stenosis of mixed density plaque (50-69%). There is low attenuation
plaque with spotty calcification suggestive of high risk plaque
(napkin ring sign). The mid and distal RCA contains minimal
non-calcified plaque (<25%). The RCA terminates as a PDA and right
posterolateral branch without evidence of plaque or stenosis.

Right Atrium: Right atrial size is within normal limits.

Right Ventricle: The right ventricular cavity is within normal
limits.

Left Atrium: Left atrial size is normal in size with no left atrial
appendage filling defect.

Left Ventricle: The ventricular cavity size is within normal limits.
There are no stigmata of prior infarction. There is no abnormal
filling defect.

Pulmonary arteries: Normal in size without proximal filling defect.

Pulmonary veins: Normal pulmonary venous drainage.

Pericardium: Normal thickness with no significant effusion or
calcium present.

Cardiac valves: The aortic valve is trileaflet without significant
calcification. The mitral valve is normal structure without
significant calcification.

Aorta: Normal caliber with no significant disease.

Extra-cardiac findings: See attached radiology report for
non-cardiac structures.
IMPRESSION: 1. Coronary calcium score of 25. This was 75th percentile for age-,
sex, and race-matched controls.

2. Normal coronary origin with right dominance.

3. Moderate mixed density plaque (50-69%) in the proximal RCA.
Plaque has high-risk features (low attenuation plaque, spotty
calcification; napkin ring sign).

4. Mild non-calcified plaque in the mid LAD (25-49%).

5.  Minimal non-calcified plaque (<25%) in the LCX.

RECOMMENDATIONS:
1. Moderate stenosis in the proximal RCA. Consider symptom-guided
anti-ischemic pharmacotherapy as well as risk factor modification
per guideline directed care. Additional analysis with CT FFR will be
submitted.

EXAM:
OVER-READ INTERPRETATION  CT CHEST

The following report is an over-read performed by radiologist Dr.
over-read does not include interpretation of cardiac or coronary
anatomy or pathology. The coronary calcium score interpretation by
the cardiologist is attached.
FINDINGS: No significant noncardiac vascular findings. Visualized mediastinum
and hilar regions demonstrate no lymphadenopathy or masses.
Visualized lungs show no evidence of pulmonary edema, consolidation,
pneumothorax, nodule or pleural fluid. Visualized upper abdomen and
bony structures are unremarkable.
IMPRESSION: No significant incidental findings.

*** End of Addendum ***
FINDINGS: Image quality: Excellent.

Noise artifact is: Limited.

Coronary Arteries:  Normal coronary origin.  Right dominance.

Left main: The left main is a large caliber vessel with a normal
take off from the left coronary cusp that bifurcates to form a left
anterior descending artery and a left circumflex artery. There is no
plaque or stenosis.

Left anterior descending artery: The proximal LAD contains minimal
non-calcified plaque (<25%). The mid LAD contains mild non-calcified
plaque (25-49%). The distal LAD is patent. The LAD gives off 3
patent diagonal branches.

Left circumflex artery: The LCX is non-dominant and contains minimal
non-calcified plaque (<25%). The LCX gives off 1 patent obtuse
marginal branch.

Right coronary artery: The RCA is dominant with normal take off from
the right coronary cusp. The proximal RCA contains a moderate
stenosis of mixed density plaque (50-69%). There is low attenuation
plaque with spotty calcification suggestive of high risk plaque
(napkin ring sign). The mid and distal RCA contains minimal
non-calcified plaque (<25%). The RCA terminates as a PDA and right
posterolateral branch without evidence of plaque or stenosis.

Right Atrium: Right atrial size is within normal limits.

Right Ventricle: The right ventricular cavity is within normal
limits.

Left Atrium: Left atrial size is normal in size with no left atrial
appendage filling defect.

Left Ventricle: The ventricular cavity size is within normal limits.
There are no stigmata of prior infarction. There is no abnormal
filling defect.

Pulmonary arteries: Normal in size without proximal filling defect.

Pulmonary veins: Normal pulmonary venous drainage.

Pericardium: Normal thickness with no significant effusion or
calcium present.

Cardiac valves: The aortic valve is trileaflet without significant
calcification. The mitral valve is normal structure without
significant calcification.

Aorta: Normal caliber with no significant disease.

Extra-cardiac findings: See attached radiology report for
non-cardiac structures.
IMPRESSION: 1. Coronary calcium score of 25. This was 75th percentile for age-,
sex, and race-matched controls.

2. Normal coronary origin with right dominance.

3. Moderate mixed density plaque (50-69%) in the proximal RCA.
Plaque has high-risk features (low attenuation plaque, spotty
calcification; napkin ring sign).

4. Mild non-calcified plaque in the mid LAD (25-49%).

5.  Minimal non-calcified plaque (<25%) in the LCX.

RECOMMENDATIONS:
1. Moderate stenosis in the proximal RCA. Consider symptom-guided
anti-ischemic pharmacotherapy as well as risk factor modification
per guideline directed care. Additional analysis with CT FFR will be
submitted.

## 2020-10-04 MED ORDER — NITROGLYCERIN 0.4 MG SL SUBL
SUBLINGUAL_TABLET | SUBLINGUAL | Status: AC
  Filled 2020-10-04: qty 2

## 2020-10-04 MED ORDER — IOHEXOL 350 MG/ML SOLN
95.0000 mL | Freq: Once | INTRAVENOUS | Status: AC | PRN
  Administered 2020-10-04: 95 mL via INTRAVENOUS

## 2020-10-04 MED ORDER — NITROGLYCERIN 0.4 MG SL SUBL
0.8000 mg | SUBLINGUAL_TABLET | Freq: Once | SUBLINGUAL | Status: AC
  Administered 2020-10-04: 0.8 mg via SUBLINGUAL

## 2020-10-05 ENCOUNTER — Encounter: Payer: Self-pay | Admitting: *Deleted

## 2020-10-05 ENCOUNTER — Other Ambulatory Visit: Payer: Self-pay | Admitting: *Deleted

## 2020-10-05 DIAGNOSIS — I10 Essential (primary) hypertension: Secondary | ICD-10-CM

## 2020-10-05 DIAGNOSIS — Z01812 Encounter for preprocedural laboratory examination: Secondary | ICD-10-CM

## 2020-10-05 DIAGNOSIS — I2510 Atherosclerotic heart disease of native coronary artery without angina pectoris: Secondary | ICD-10-CM | POA: Diagnosis not present

## 2020-10-06 ENCOUNTER — Other Ambulatory Visit: Payer: Medicaid Other

## 2020-10-09 ENCOUNTER — Telehealth: Payer: Self-pay | Admitting: *Deleted

## 2020-10-09 ENCOUNTER — Ambulatory Visit: Payer: Medicaid Other | Attending: Internal Medicine | Admitting: Cardiology

## 2020-10-09 ENCOUNTER — Telehealth: Payer: Self-pay | Admitting: Internal Medicine

## 2020-10-09 ENCOUNTER — Other Ambulatory Visit: Payer: Self-pay

## 2020-10-09 DIAGNOSIS — I455 Other specified heart block: Secondary | ICD-10-CM | POA: Diagnosis not present

## 2020-10-09 NOTE — Telephone Encounter (Addendum)
Cardiac Catheterization scheduled at [facility] for Tuesday October 10, 2020 10:30 AM Verified arrival time and place: [facility] Main Entrance [facility]) at: 8:15 AM Pt confirmed he did not have lab done 10/06/20, knows he will need lab on arrival to Short Stay morning of cath.    No solid food after midnight prior to cath, clear liquids until 5 AM day of procedure.  Hold: Lisinopril/HCT-AM of procedure  Except hold medications AM meds can be  taken pre-cath with sips of water including:   aspirin 81 mg   Confirmed patient has responsible adult to drive home post procedure and be with patient first 24 hours after arriving home.  Patients are allowed one visitor in the waiting room during the time they are at the hospital for their procedure. Both patient and visitor must wear a mask once they enter the hospital.   Patient reports does not currently have any symptoms concerning for COVID-19 and no household members with COVID-19 like illness.     Reviewed procedure/mask/visitor instructions with patient.

## 2020-10-09 NOTE — Telephone Encounter (Signed)
Called Patient using two identifiers for confirmation in regard to [facility] Discussed risks, benefits, and alternatives for LHC given his moderate flow liming pRCA disease. Answered questions in regard to other forms of stenting (carotid artery stenting) and that PCI may delay other non-urgent surgies  Risks and benefits of cardiac catheterization have been discussed with the patient.  These include bleeding, infection, kidney damage, stroke, heart attack, death.  The patient understands these risks and is willing to proceed.   Patient had no further questions.  Robert Lam, MD Cardiologist [facility]  [address] [place], [address] [place], [place] [address] [phone]  10:43 AM

## 2020-10-10 ENCOUNTER — Encounter (HOSPITAL_COMMUNITY): Payer: Self-pay | Admitting: Cardiovascular Disease

## 2020-10-10 ENCOUNTER — Encounter (HOSPITAL_COMMUNITY)
Admission: RE | Disposition: A | Discharge status: Home / Self Care | Payer: Self-pay | Source: Home / Self Care | Attending: Cardiovascular Disease

## 2020-10-10 ENCOUNTER — Other Ambulatory Visit: Payer: Self-pay

## 2020-10-10 ENCOUNTER — Observation Stay (HOSPITAL_COMMUNITY)
Admission: RE | Admit: 2020-10-10 | Discharge: 2020-10-11 | Disposition: A | Discharge status: Home / Self Care | Payer: Medicaid Other | Attending: Cardiovascular Disease | Admitting: Cardiovascular Disease

## 2020-10-10 DIAGNOSIS — Z7982 Long term (current) use of aspirin: Secondary | ICD-10-CM | POA: Diagnosis not present

## 2020-10-10 DIAGNOSIS — Z8673 Personal history of transient ischemic attack (TIA), and cerebral infarction without residual deficits: Secondary | ICD-10-CM | POA: Insufficient documentation

## 2020-10-10 DIAGNOSIS — I2510 Atherosclerotic heart disease of native coronary artery without angina pectoris: Secondary | ICD-10-CM | POA: Diagnosis not present

## 2020-10-10 DIAGNOSIS — F17210 Nicotine dependence, cigarettes, uncomplicated: Secondary | ICD-10-CM | POA: Diagnosis not present

## 2020-10-10 DIAGNOSIS — I10 Essential (primary) hypertension: Secondary | ICD-10-CM | POA: Insufficient documentation

## 2020-10-10 DIAGNOSIS — Z79899 Other long term (current) drug therapy: Secondary | ICD-10-CM | POA: Diagnosis not present

## 2020-10-10 DIAGNOSIS — R0600 Dyspnea, unspecified: Secondary | ICD-10-CM

## 2020-10-10 DIAGNOSIS — R931 Abnormal findings on diagnostic imaging of heart and coronary circulation: Secondary | ICD-10-CM

## 2020-10-10 DIAGNOSIS — Z955 Presence of coronary angioplasty implant and graft: Secondary | ICD-10-CM

## 2020-10-10 DIAGNOSIS — R0609 Other forms of dyspnea: Secondary | ICD-10-CM

## 2020-10-10 DIAGNOSIS — R531 Weakness: Secondary | ICD-10-CM | POA: Diagnosis present

## 2020-10-10 HISTORY — PX: CORONARY STENT INTERVENTION: CATH118234

## 2020-10-10 HISTORY — PX: LEFT HEART CATH AND CORONARY ANGIOGRAPHY: CATH118249

## 2020-10-10 LAB — CBC
HCT: 40.6 % (ref 39.0–52.0)
Hemoglobin: 13.6 g/dL (ref 13.0–17.0)
MCH: 31.7 pg (ref 26.0–34.0)
MCHC: 33.5 g/dL (ref 30.0–36.0)
MCV: 94.6 fL (ref 80.0–100.0)
Platelets: 196 10*3/uL (ref 150–400)
RBC: 4.29 MIL/uL (ref 4.22–5.81)
RDW: 11.7 % (ref 11.5–15.5)
WBC: 6.3 10*3/uL (ref 4.0–10.5)
nRBC: 0 % (ref 0.0–0.2)

## 2020-10-10 LAB — POCT ACTIVATED CLOTTING TIME
Activated Clotting Time: 271 seconds
Activated Clotting Time: 271 seconds

## 2020-10-10 SURGERY — LEFT HEART CATH AND CORONARY ANGIOGRAPHY
Anesthesia: LOCAL

## 2020-10-10 MED ORDER — HYDRALAZINE HCL 20 MG/ML IJ SOLN
10.0000 mg | INTRAMUSCULAR | Status: AC | PRN
Start: 2020-10-10 — End: 2020-10-10
  Administered 2020-10-10 (×2): 10 mg via INTRAVENOUS
  Filled 2020-10-10 (×2): qty 1

## 2020-10-10 MED ORDER — SODIUM CHLORIDE 0.9 % IV SOLN: INTRAVENOUS | Status: AC

## 2020-10-10 MED ORDER — SODIUM CHLORIDE 0.9 % WEIGHT BASED INFUSION
3.0000 mL/kg/h | INTRAVENOUS | Status: DC
  Administered 2020-10-10: 3 mL/kg/h via INTRAVENOUS

## 2020-10-10 MED ORDER — ONDANSETRON HCL 4 MG/2ML IJ SOLN: 4.0000 mg | Freq: Four times a day (QID) | INTRAMUSCULAR | Status: DC | PRN

## 2020-10-10 MED ORDER — MIDAZOLAM HCL 2 MG/2ML IJ SOLN
INTRAMUSCULAR | Status: AC
  Filled 2020-10-10: qty 2

## 2020-10-10 MED ORDER — FENTANYL CITRATE (PF) 100 MCG/2ML IJ SOLN
INTRAMUSCULAR | Status: DC | PRN
  Administered 2020-10-10: 50 ug via INTRAVENOUS

## 2020-10-10 MED ORDER — LIDOCAINE HCL (PF) 1 % IJ SOLN
INTRAMUSCULAR | Status: AC
  Filled 2020-10-10: qty 30

## 2020-10-10 MED ORDER — SODIUM CHLORIDE 0.9 % IV SOLN: 250.0000 mL | INTRAVENOUS | Status: DC | PRN

## 2020-10-10 MED ORDER — AMLODIPINE BESYLATE 10 MG PO TABS
10.0000 mg | ORAL_TABLET | Freq: Once | ORAL | Status: AC
  Administered 2020-10-11: 10 mg via ORAL
  Filled 2020-10-10: qty 1

## 2020-10-10 MED ORDER — LISINOPRIL 20 MG PO TABS
20.0000 mg | ORAL_TABLET | Freq: Once | ORAL | Status: AC
  Administered 2020-10-11: 20 mg via ORAL
  Filled 2020-10-10: qty 1

## 2020-10-10 MED ORDER — IOHEXOL 350 MG/ML SOLN
INTRAVENOUS | Status: DC | PRN
  Administered 2020-10-10: 150 mL

## 2020-10-10 MED ORDER — HEPARIN SODIUM (PORCINE) 1000 UNIT/ML IJ SOLN
INTRAMUSCULAR | Status: DC | PRN
  Administered 2020-10-10: 4200 [IU] via INTRAVENOUS
  Administered 2020-10-10: 1500 [IU] via INTRAVENOUS
  Administered 2020-10-10: 2000 [IU] via INTRAVENOUS
  Administered 2020-10-10: 5000 [IU] via INTRAVENOUS

## 2020-10-10 MED ORDER — TICAGRELOR 90 MG PO TABS
ORAL_TABLET | ORAL | Status: DC | PRN
  Administered 2020-10-10: 180 mg via ORAL

## 2020-10-10 MED ORDER — NITROGLYCERIN 1 MG/10 ML FOR IR/CATH LAB
INTRA_ARTERIAL | Status: AC
  Filled 2020-10-10: qty 10

## 2020-10-10 MED ORDER — MIDAZOLAM HCL 2 MG/2ML IJ SOLN
INTRAMUSCULAR | Status: DC | PRN
  Administered 2020-10-10: 2 mg via INTRAVENOUS

## 2020-10-10 MED ORDER — LABETALOL HCL 5 MG/ML IV SOLN: 10.0000 mg | INTRAVENOUS | Status: AC | PRN

## 2020-10-10 MED ORDER — HEPARIN (PORCINE) IN NACL 1000-0.9 UT/500ML-% IV SOLN
INTRAVENOUS | Status: DC | PRN
  Administered 2020-10-10 (×2): 500 mL

## 2020-10-10 MED ORDER — ACETAMINOPHEN 325 MG PO TABS: 650.0000 mg | ORAL_TABLET | ORAL | Status: DC | PRN

## 2020-10-10 MED ORDER — DIAZEPAM 5 MG PO TABS: 5.0000 mg | ORAL_TABLET | ORAL | Status: DC | PRN

## 2020-10-10 MED ORDER — ASPIRIN 81 MG PO CHEW
81.0000 mg | CHEWABLE_TABLET | Freq: Every day | ORAL | Status: DC
  Administered 2020-10-11: 81 mg via ORAL
  Filled 2020-10-10: qty 1

## 2020-10-10 MED ORDER — NITROGLYCERIN 1 MG/10 ML FOR IR/CATH LAB
INTRA_ARTERIAL | Status: DC | PRN
  Administered 2020-10-10 (×3): 200 ug via INTRACORONARY

## 2020-10-10 MED ORDER — ASPIRIN 81 MG PO CHEW: 81.0000 mg | CHEWABLE_TABLET | ORAL | Status: DC

## 2020-10-10 MED ORDER — TICAGRELOR 90 MG PO TABS
90.0000 mg | ORAL_TABLET | Freq: Two times a day (BID) | ORAL | Status: DC
  Administered 2020-10-10 – 2020-10-11 (×2): 90 mg via ORAL
  Filled 2020-10-10 (×2): qty 1

## 2020-10-10 MED ORDER — SODIUM CHLORIDE 0.9 % WEIGHT BASED INFUSION: 1.0000 mL/kg/h | INTRAVENOUS | Status: DC

## 2020-10-10 MED ORDER — SODIUM CHLORIDE 0.9% FLUSH: 3.0000 mL | Freq: Two times a day (BID) | INTRAVENOUS | Status: DC

## 2020-10-10 MED ORDER — VERAPAMIL HCL 2.5 MG/ML IV SOLN
INTRAVENOUS | Status: AC
  Filled 2020-10-10: qty 2

## 2020-10-10 MED ORDER — LIDOCAINE HCL (PF) 1 % IJ SOLN
INTRAMUSCULAR | Status: DC | PRN
  Administered 2020-10-10: 2 mL

## 2020-10-10 MED ORDER — HEPARIN (PORCINE) IN NACL 1000-0.9 UT/500ML-% IV SOLN
INTRAVENOUS | Status: AC
  Filled 2020-10-10: qty 1000

## 2020-10-10 MED ORDER — FENTANYL CITRATE (PF) 100 MCG/2ML IJ SOLN
INTRAMUSCULAR | Status: AC
  Filled 2020-10-10: qty 2

## 2020-10-10 MED ORDER — SODIUM CHLORIDE 0.9% FLUSH: 3.0000 mL | INTRAVENOUS | Status: DC | PRN

## 2020-10-10 MED ORDER — ATORVASTATIN CALCIUM 80 MG PO TABS
80.0000 mg | ORAL_TABLET | Freq: Every day | ORAL | Status: DC
  Administered 2020-10-10 – 2020-10-11 (×2): 80 mg via ORAL
  Filled 2020-10-10 (×2): qty 1

## 2020-10-10 MED ORDER — HEPARIN SODIUM (PORCINE) 1000 UNIT/ML IJ SOLN
INTRAMUSCULAR | Status: AC
  Filled 2020-10-10: qty 1

## 2020-10-10 MED ORDER — SODIUM CHLORIDE 0.9 % IV SOLN: INTRAVENOUS | Status: DC

## 2020-10-10 MED ORDER — TICAGRELOR 90 MG PO TABS
ORAL_TABLET | ORAL | Status: AC
  Filled 2020-10-10: qty 2

## 2020-10-10 MED ORDER — SODIUM CHLORIDE 0.9% FLUSH
3.0000 mL | INTRAVENOUS | Status: DC | PRN
Start: 2020-10-10 — End: 2020-10-10

## 2020-10-10 MED ORDER — VERAPAMIL HCL 2.5 MG/ML IV SOLN
INTRAVENOUS | Status: DC | PRN
  Administered 2020-10-10: 10 mL via INTRA_ARTERIAL

## 2020-10-10 SURGICAL SUPPLY — 19 items
BALLN SAPPHIRE 2.5X12 (BALLOONS) ×2
BALLN SAPPHIRE ~~LOC~~ 3.25X12 (BALLOONS) ×1 IMPLANT
BALLOON SAPPHIRE 2.5X12 (BALLOONS) IMPLANT
CATH INFINITI 5 FR 3DRC (CATHETERS) ×1 IMPLANT
CATH INFINITI JR4 5F (CATHETERS) ×1 IMPLANT
CATH OPTITORQUE TIG 4.0 5F (CATHETERS) ×1 IMPLANT
CATH VISTA GUIDE 6FR AR2 (CATHETERS) ×1 IMPLANT
DEVICE RAD COMP TR BAND LRG (VASCULAR PRODUCTS) ×1 IMPLANT
GLIDESHEATH SLEND SS 6F .021 (SHEATH) ×1 IMPLANT
GUIDEWIRE INQWIRE 1.5J.035X260 (WIRE) IMPLANT
INQWIRE 1.5J .035X260CM (WIRE) ×2
KIT ENCORE 26 ADVANTAGE (KITS) ×1 IMPLANT
KIT HEART LEFT (KITS) ×2 IMPLANT
PACK CARDIAC CATHETERIZATION (CUSTOM PROCEDURE TRAY) ×2 IMPLANT
SHEATH PROBE COVER 6X72 (BAG) ×1 IMPLANT
STENT ONYX FRONTIER 3.0X15 (Permanent Stent) ×1 IMPLANT
TRANSDUCER W/STOPCOCK (MISCELLANEOUS) ×2 IMPLANT
TUBING CIL FLEX 10 FLL-RA (TUBING) ×2 IMPLANT
WIRE COUGAR XT STRL 190CM (WIRE) ×1 IMPLANT

## 2020-10-10 NOTE — Progress Notes (Signed)
Patient transferred from cath lab at 1625hrs.  Oriented to unit and plan for shift.  Reviewed post cath instructions.  TR band to right wrist with small amount of blood, no hematoma, +2 radial pulse, TR band on.

## 2020-10-10 NOTE — Interval H&P Note (Signed)
Cath Lab Visit (complete for each Cath Lab visit)  Clinical Evaluation Leading to the Procedure:   ACS: No.  Non-ACS:    Anginal Classification: CCS I  Anti-ischemic medical therapy: Minimal Therapy (1 class of medications)  Non-Invasive Test Results: Intermediate-risk stress test findings: cardiac mortality 1-3%/year  Prior CABG: No previous CABG      History and Physical Interval Note:  10/10/2020 10:47 AM  Robert [name]  has presented today for surgery, with the diagnosis of abnormal CT, shortness of breath.  The various methods of treatment have been discussed with the patient and family. After consideration of risks, benefits and other options for treatment, the patient has consented to  Procedure(s): LEFT HEART CATH AND CORONARY ANGIOGRAPHY (N/A) as a surgical intervention.  The patient's history has been reviewed, patient examined, no change in status, stable for surgery.  I have reviewed the patient's chart and labs.  Questions were answered to the patient's satisfaction.     Robert [name]

## 2020-10-10 NOTE — Progress Notes (Signed)
Hydralazine 10 mg IVP given for BP 159/88, HR 47.  Will continue to monitor.

## 2020-10-11 ENCOUNTER — Other Ambulatory Visit (HOSPITAL_COMMUNITY): Payer: Self-pay

## 2020-10-11 DIAGNOSIS — Z7982 Long term (current) use of aspirin: Secondary | ICD-10-CM | POA: Diagnosis not present

## 2020-10-11 DIAGNOSIS — I2510 Atherosclerotic heart disease of native coronary artery without angina pectoris: Secondary | ICD-10-CM | POA: Diagnosis present

## 2020-10-11 DIAGNOSIS — I10 Essential (primary) hypertension: Secondary | ICD-10-CM | POA: Diagnosis not present

## 2020-10-11 DIAGNOSIS — Z8673 Personal history of transient ischemic attack (TIA), and cerebral infarction without residual deficits: Secondary | ICD-10-CM | POA: Diagnosis not present

## 2020-10-11 LAB — BASIC METABOLIC PANEL
Anion gap: 9 (ref 5–15)
BUN: 11 mg/dL (ref 6–20)
CO2: 24 mmol/L (ref 22–32)
Calcium: 8.9 mg/dL (ref 8.9–10.3)
Chloride: 105 mmol/L (ref 98–111)
Creatinine, Ser: 1.05 mg/dL (ref 0.61–1.24)
GFR, Estimated: >60 mL/min (ref >60)
Glucose, Bld: 91 mg/dL (ref 70–99)
Potassium: 3.3 mmol/L — ABNORMAL LOW (ref 3.5–5.1)
Sodium: 138 mmol/L (ref 135–145)

## 2020-10-11 LAB — CBC
HCT: 39.3 % (ref 39.0–52.0)
Hemoglobin: 13.8 g/dL (ref 13.0–17.0)
MCH: 32.4 pg (ref 26.0–34.0)
MCHC: 35.1 g/dL (ref 30.0–36.0)
MCV: 92.3 fL (ref 80.0–100.0)
Platelets: 213 10*3/uL (ref 150–400)
RBC: 4.26 MIL/uL (ref 4.22–5.81)
RDW: 11.5 % (ref 11.5–15.5)
WBC: 7.7 10*3/uL (ref 4.0–10.5)
nRBC: 0 % (ref 0.0–0.2)

## 2020-10-11 MED ORDER — CLOPIDOGREL BISULFATE 75 MG PO TABS
300.0000 mg | ORAL_TABLET | Freq: Once | ORAL | Status: AC
  Administered 2020-10-11: 300 mg via ORAL
  Filled 2020-10-11: qty 4

## 2020-10-11 MED ORDER — LISINOPRIL 20 MG PO TABS
20.0000 mg | ORAL_TABLET | Freq: Every day | ORAL | Status: DC
  Administered 2020-10-11: 20 mg via ORAL
  Filled 2020-10-11: qty 1

## 2020-10-11 MED ORDER — CLOPIDOGREL BISULFATE 75 MG PO TABS: 75.0000 mg | ORAL_TABLET | Freq: Every day | ORAL | Status: DC

## 2020-10-11 MED ORDER — GABAPENTIN 800 MG PO TABS: ORAL_TABLET | ORAL | 1 refills | Status: DC

## 2020-10-11 MED ORDER — NITROGLYCERIN 0.4 MG SL SUBL
0.4000 mg | SUBLINGUAL_TABLET | SUBLINGUAL | 1 refills | Status: DC | PRN
Start: 2020-10-11 — End: 2023-09-03
  Filled 2020-10-11: qty 25, 7d supply, fill #0

## 2020-10-11 MED ORDER — HYDROCHLOROTHIAZIDE 12.5 MG PO CAPS
12.5000 mg | ORAL_CAPSULE | Freq: Every day | ORAL | Status: DC
  Administered 2020-10-11: 12.5 mg via ORAL
  Filled 2020-10-11: qty 1

## 2020-10-11 MED ORDER — AMLODIPINE BESYLATE 10 MG PO TABS: 10.0000 mg | ORAL_TABLET | Freq: Every day | ORAL | Status: DC

## 2020-10-11 MED ORDER — CLOPIDOGREL BISULFATE 75 MG PO TABS: 300.0000 mg | ORAL_TABLET | Freq: Once | ORAL | Status: DC

## 2020-10-11 MED ORDER — POTASSIUM CHLORIDE CRYS ER 20 MEQ PO TBCR
30.0000 meq | EXTENDED_RELEASE_TABLET | Freq: Two times a day (BID) | ORAL | Status: DC
  Administered 2020-10-11: 30 meq via ORAL
  Filled 2020-10-11: qty 1

## 2020-10-11 MED ORDER — ATORVASTATIN CALCIUM 80 MG PO TABS
80.0000 mg | ORAL_TABLET | Freq: Every day | ORAL | 3 refills | Status: DC
Start: 2020-10-12 — End: 2023-09-03
  Filled 2020-10-11: qty 90, 90d supply, fill #0

## 2020-10-11 MED ORDER — CLOPIDOGREL BISULFATE 75 MG PO TABS
75.0000 mg | ORAL_TABLET | Freq: Every day | ORAL | 3 refills | Status: DC
  Filled 2020-10-11: qty 90, 90d supply, fill #0

## 2020-10-11 NOTE — Care Management (Signed)
[id number] 10-11-20 Patient is listed as having Medicaid for insurance. Co pay for Brilinta should be no more than $3.00.

## 2020-10-11 NOTE — Progress Notes (Signed)
CARDIAC REHAB PHASE I   PRE:  Rate/Rhythm: 98 SR  BP:  Supine:   Sitting: 156/88  Standing:    SaO2: 99%RA  MODE:  Ambulation: 370 ft   POST:  Rate/Rhythm: 104 ST  BP:  Supine:   Sitting: 134/78  Standing:    SaO2: 99%RA [id number] Pt up in room when I arrived. Pt walked 370 ft on RA with steady gait. No CP. Pt can only walk short distances due to slipped disc. Encouraged walking as tolerated but did not give written guidelines. Referral to [facility] CRP 2 but pt stated will not be able to attend as he will need other health issues addressed like slipped disc. Reviewed NTG use, gave heart healthy diet and discussed some healthy choices, gave smoking cessation handout and encouraged pt to call [employer]. Pt stated he cannot quit cold [place] but is going to try to decrease slowly. Discussed how a coach at [employer] could offer encouragement and support. Reinforced several times importance of brilinta with stent and not running out or forgetting. Pt voiced understanding of ed.    Luetta Nutting, RN BSN  10/11/2020 9:25 AM

## 2020-10-11 NOTE — Discharge Summary (Addendum)
Discharge Summary    Patient ID: Robert [name] MRN: [id number]; DOB: Dec 22, 1965  Admit date: 10/10/2020 Discharge date: 10/11/2020  PCP:  Grayce Sessions, NP   [facility] Providers Cardiologist:  Meriam Sprague, MD    Discharge Diagnoses    Active Problems:   Exertional dyspnea   Abnormal cardiac CT angiography   CAD in native artery   CAD (coronary artery disease)    Diagnostic Studies/Procedures    Cath: 10/10/20   Mid LAD lesion is 20% stenosed.   Mid Cx lesion is 5% stenosed.   Prox RCA lesion is 80% stenosed.   Dist RCA lesion is 20% stenosed.   A stent was successfully placed.   Post intervention, there is a 0% residual stenosis.   Significant single-vessel CAD with 80% napkin ring like stenosis in the proximal RCA with mild luminal irregularity of the remainder of the vessel and 20% stenosis proximal to the acute margin.  There is mild nonobstructive smooth plaque of 20% in the mid LAD and 5% in the left circumflex vessel.   LVEDP 11 mmHg.   Successful percutaneous coronary intervention to the proximal RCA with ultimate insertion of a 3.0 x 15 mm Resolute Frontier stent postdilated to 3.34 millimeters with the stenosis being reduced to 0% and brisk TIMI-3 flow.   RECOMMENDATIION: DAPT for minimum of 6 months but preferably a year.  Smoking cessation is imperative.  Aggressive lipid-lowering therapy with target LDL less than 70.  Optimal blood pressure control.  Diagnostic Dominance: Right    Intervention    _____________   History of Present Illness     Robert [name] is a 55 y.o. male with a hx of HTN, HLD, tobacco abuse and recent CVA who was found to have sinus pauses on cardiac monitor who now presents to clinic for follow-up.   Patient was admitted from 07/09/20-07/12/20 after presenting with left handed weakness, slurred speech and facial droop. MRI on admission showed scattered generally small cortical and subcortical white matter infarcts in  the right MCA territory with some involvement in the lateral right PCA/watershed area.  There are also small infarct in the right cerebellum.  CT angiogram showed abrupt occlusion of right ICA just distal to the bifurcation. He was initially placed on dual antiplatelet therapy with aspirin and Plavix however on the MRI it was also evident some additional petechial hemorrhage in the posterior right MCA territory and small volume intraventricular hemorrhage in the fourth ventricle which was not not apparent in the original CT.  His dual antiplatelet therapy was then held and he was ultimately discharged on aspirin. Hypercoagulable panel including vitamin B12, anticardiolipin antibodies, lupus anticoagulant, beta-2 glycoprotein antibodies, ANA and RPR were negative.   After his stroke, a 30day monitor was placed for monitoring of Afib. His monitor showed several episodes of sinus pauses lasting up to 8.6 seconds. Thought to be sleeping at that time. He denied any lightheadedness, dizziness, syncope. We referred him to the ER and was seen by Dr. Graciela Husbands who recommended sleep study and continued monitoring. He unfortunately has missed several follow-up appointments. Sleep study still pending.    He was seen on 7/20 in the office and continued to have residual deficits from his stroke with intermittent left leg weakness, blurred vision, left sided facial numbness, and weakness in this left hand. Has been planned to see NSGY for concern for nerve compression from a slipped disc. Was unable to see sooner due to lack of insurance.    Otherwise,  he has been having episodes of lightheadedness and weakness with exertion or when in the sun for a prolonged period. He finds himself needing to sit down in order to feel back to normal and sometimes it takes him several minutes to feel normal again. Also, having episodes of chest heaviness and SOB with exertion. These symptoms seem to be progressing such that he had to stop to  rest after walking from his car into [employer]. No syncope. No palpitations, orthopnea, PND or LE edema. Has not been told he snores. States blood pressure at home has been running 130s at home. Continues to smoke 1ppd but is trying to quit.    He was set up for outpatient coronary CT which was abnormal with moderate lesion in the RCA. It was recommended that he undergo cardiac cath.   Hospital Course   Underwent cardiac cath noted above with 80% mRCA lesion treated with PCI/DES x1. Initially placed on DAPT with ASA/Brilinta but switched to plavix prior to discharge with 300mg  load. Given his hx of petechial hemorrhage with small volume intraventricular hemorrhage did reach out to neurology regarding management with DAPT. Instructed ok to be on DAPT given small area of bleed and he is now over 2 months out from CVA. No indication for repeat imaging while on DAPT unless the patient develops acute neurological symptoms. He was continued on statin, Zestoretic, Norvasc on discharge. Ambulated with CR without chest pain.   General: Well developed, well nourished, male appearing in no acute distress. Head: Normocephalic, atraumatic.  Neck: Supple without bruits, JVD. Lungs:  Resp regular and unlabored, CTA. Heart: RRR, S1, S2, no S3, S4, or murmur; no rub. Abdomen: Soft, non-tender, non-distended with normoactive bowel sounds. No hepatomegaly. No rebound/guarding. No obvious abdominal masses. Extremities: No clubbing, cyanosis, edema. Distal pedal pulses are 2+ bilaterally. Right radial cath site stable without bruising or hematoma Neuro: Alert and oriented X 3. Moves all extremities spontaneously. Psych: Normal affect.  Patient was seen by Dr. and deemed stable for discharge. Medications sent to [facility] pharmacy. Follow up in the office has been arranged. Educated by PharmD prior to discharge.   Did the patient have an acute coronary syndrome (MI, NSTEMI, STEMI, etc) this admission?:  No                                Did the patient have a percutaneous coronary intervention (stent / angioplasty)?:  Yes.     Cath/PCI Registry Performance & Quality Measures: Aspirin prescribed? - Yes ADP Receptor Inhibitor (Plavix/Clopidogrel, Brilinta/Ticagrelor or Effient/Prasugrel) prescribed (includes medically managed patients)? - Yes High Intensity Statin (Lipitor 40-80mg  or Crestor 20-40mg ) prescribed? - Yes For EF <40%, was ACEI/ARB prescribed? - Not Applicable (EF >/= 40%) For EF <40%, Aldosterone Antagonist (Spironolactone or Eplerenone) prescribed? - Not Applicable (EF >/= 40%) Cardiac Rehab Phase II ordered? - Yes      _____________  Discharge Vitals Blood pressure 128/87, pulse 61, temperature 98.4 F (36.9 C), temperature source Oral, resp. rate 13, height 6' (1.829 m), weight 84.4 kg, SpO2 100 %.  Filed Weights   10/10/20 0814  Weight: 84.4 kg    Labs & Radiologic Studies    CBC Recent Labs    10/10/20 0856 10/11/20 0120  WBC 6.3 7.7  HGB 13.6 13.8  HCT 40.6 39.3  MCV 94.6 92.3  PLT 196 213   Basic Metabolic Panel Recent Labs    12/11/20 0120  NA 138  K 3.3*  CL 105  CO2 24  GLUCOSE 91  BUN 11  CREATININE 1.05  CALCIUM 8.9   Liver Function Tests No results for input(s): AST, ALT, ALKPHOS, BILITOT, PROT, ALBUMIN in the last 72 hours. No results for input(s): LIPASE, AMYLASE in the last 72 hours. High Sensitivity Troponin:   No results for input(s): TROPONINIHS in the last 720 hours.  BNP Invalid input(s): POCBNP D-Dimer No results for input(s): DDIMER in the last 72 hours. Hemoglobin A1C No results for input(s): HGBA1C in the last 72 hours. Fasting Lipid Panel No results for input(s): CHOL, HDL, LDLCALC, TRIG, CHOLHDL, LDLDIRECT in the last 72 hours. Thyroid Function Tests No results for input(s): TSH, T4TOTAL, T3FREE, THYROIDAB in the last 72 hours.  Invalid input(s): FREET3 _____________  CARDIAC CATHETERIZATION  Result Date: 10/10/2020 Formatting  of this result is different from the original.   Mid LAD lesion is 20% stenosed.   Mid Cx lesion is 5% stenosed.   Prox RCA lesion is 80% stenosed.   Dist RCA lesion is 20% stenosed.   A stent was successfully placed.   Post intervention, there is a 0% residual stenosis. Significant single-vessel CAD with 80% napkin ring like stenosis in the proximal RCA with mild luminal irregularity of the remainder of the vessel and 20% stenosis proximal to the acute margin.  There is mild nonobstructive smooth plaque of 20% in the mid LAD and 5% in the left circumflex vessel. LVEDP 11 mmHg. Successful percutaneous coronary intervention to the proximal RCA with ultimate insertion of a 3.0 x 15 mm Resolute Frontier stent postdilated to 3.34 millimeters with the stenosis being reduced to 0% and brisk TIMI-3 flow. RECOMMENDATIION: DAPT for minimum of 6 months but preferably a year.  Smoking cessation is imperative.  Aggressive lipid-lowering therapy with target LDL less than 70.  Optimal blood pressure control.   CT CORONARY MORPH W/CTA COR W/SCORE W/CA W/CM &/OR WO/CM  Addendum Date: 10/05/2020   ADDENDUM REPORT: 10/05/2020 08:09 EXAM: OVER-READ INTERPRETATION  CT CHEST The following report is an over-read performed by radiologist Dr. Marinda Elk [facility] on 10/05/2020. This over-read does not include interpretation of cardiac or coronary anatomy or pathology. The coronary calcium score interpretation by the cardiologist is attached. COMPARISON:  None. FINDINGS: No significant noncardiac vascular findings. Visualized mediastinum and hilar regions demonstrate no lymphadenopathy or masses. Visualized lungs show no evidence of pulmonary edema, consolidation, pneumothorax, nodule or pleural fluid. Visualized upper abdomen and bony structures are unremarkable. IMPRESSION: No significant incidental findings. Electronically Signed   By: Irish Lack M.D.   On: 10/05/2020 08:09   Result Date:  10/05/2020 CLINICAL DATA:  Chest pain EXAM: Cardiac/Coronary CTA TECHNIQUE: A non-contrast, gated CT scan was obtained with axial slices of 3 mm through the heart for calcium scoring. Calcium scoring was performed using the Agatston method. A 100 kV prospective, gated, contrast cardiac scan was obtained. Gantry rotation speed was 250 msecs and collimation was 0.6 mm. Two sublingual nitroglycerin tablets (0.8 mg) were given. The 3D data set was reconstructed in 5% intervals of the 35-75% of the R-R cycle. Diastolic phases were analyzed on a dedicated workstation using MPR, MIP, and VRT modes. The patient received 95 cc of contrast. FINDINGS: Image quality: Excellent. Noise artifact is: Limited. Coronary Arteries:  Normal coronary origin.  Right dominance. Left main: The left main is a large caliber vessel with a normal take off from the left coronary cusp that bifurcates to form a  left anterior descending artery and a left circumflex artery. There is no plaque or stenosis. Left anterior descending artery: The proximal LAD contains minimal non-calcified plaque (<25%). The mid LAD contains mild non-calcified plaque (25-49%). The distal LAD is patent. The LAD gives off 3 patent diagonal branches. Left circumflex artery: The LCX is non-dominant and contains minimal non-calcified plaque (<25%). The LCX gives off 1 patent obtuse marginal branch. Right coronary artery: The RCA is dominant with normal take off from the right coronary cusp. The proximal RCA contains a moderate stenosis of mixed density plaque (50-69%). There is low attenuation plaque with spotty calcification suggestive of high risk plaque (napkin ring sign). The mid and distal RCA contains minimal non-calcified plaque (<25%). The RCA terminates as a PDA and right posterolateral branch without evidence of plaque or stenosis. Right Atrium: Right atrial size is within normal limits. Right Ventricle: The right ventricular cavity is within normal limits. Left  Atrium: Left atrial size is normal in size with no left atrial appendage filling defect. Left Ventricle: The ventricular cavity size is within normal limits. There are no stigmata of prior infarction. There is no abnormal filling defect. Pulmonary arteries: Normal in size without proximal filling defect. Pulmonary veins: Normal pulmonary venous drainage. Pericardium: Normal thickness with no significant effusion or calcium present. Cardiac valves: The aortic valve is trileaflet without significant calcification. The mitral valve is normal structure without significant calcification. Aorta: Normal caliber with no significant disease. Extra-cardiac findings: See attached radiology report for non-cardiac structures. IMPRESSION: 1. Coronary calcium score of 25. This was 75th percentile for age-, sex, and race-matched controls. 2. Normal coronary origin with right dominance. 3. 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 Plaque has high-risk features (low attenuation plaque, spotty calcification; napkin ring sign). 4. Mild non-calcified plaque in the mid LAD (25-49%). 5.  Minimal non-calcified plaque (<25%) in the LCX. RECOMMENDATIONS: 1. 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 Consider symptom-guided anti-ischemic pharmacotherapy as well as risk factor modification per guideline directed care. Additional analysis with CT FFR will be submitted. Lennie OdorWesley O'Neal, MD Electronically Signed: By: Lennie OdorWesley  O'Neal On: 10/04/2020 16:52   CT CORONARY FRACTIONAL FLOW RESERVE DATA PREP  Result Date: 10/05/2020 EXAM: CT FFR analysis was performed on the original cardiac CTA dataset. Diagrammatic representation of the CT FFR analysis is provided in a separate PDF document in PACS. This dictation was created using the PDF document and an interactive 3D model of the results. The 3D model is not available in the EMR/PACS. INTERPRETATION: CT FFR provides simultaneous calculation of pressure and flow across the entire  coronary tree. For clinical decision making, CT FFR values should be obtained 1-2 cm distal to the lower border of each stenosis measured. Coronary CTA-related artifacts may impair the diagnostic accuracy of the original cardiac CTA and FFR CT results. *Due to the fact that CT FFR represents a mathematically-derived analysis, it is recommended that the results be interpreted as follows: *CT FFR >0.80: Low likelihood of hemodynamic significance. *CT FFR 0.76-0.80: Borderline likelihood of hemodynamic significance. *CT FFR =< 0.75: High likelihood of hemodynamic significance. *Coronary CT Angiography-derived Fractional Flow Reserve Testing in Patients with Stable Coronary Artery Disease: Recommendations on Interpretation and Reporting. Radiology: Cardiothoracic Imaging. 2019;1(5):e190050 FINDINGS: 1. Left Main: 0.98; no significant stenosis. 2. LAD: 0.90; no significant stenosis. 3. LCX: 0.93; no significant stenosis. 4. Proximal RCA: 0.75; significant stenosis. IMPRESSION: 1.  Obstructive CAD is present in the proximal RCA (CT FFR 0.75). Lennie OdorWesley O'Neal, MD Electronically Signed  By: Lennie Odor   On: 10/05/2020 06:48   Disposition   Pt is being discharged home today in good condition.  Follow-up Plans & Appointments     Follow-up Information     Duke Salvia, MD Follow up on 10/16/2020.   Specialty: Cardiology Why: at 3:45pm for your new pt appt Contact information: [address] [place] [address] [phone]         Ronney Asters, NP Follow up on 11/09/2020.   Specialty: Cardiology Why: at 11:15am for your follow up appt Contact information: [address] [place] [address] [phone]                Discharge Instructions     Amb Referral to Cardiac Rehabilitation   Complete by: As directed    Referring to [facility] CRP 2   Diagnosis: Coronary Stents   After initial evaluation and assessments completed: Virtual Based Care may be  provided alone or in conjunction with Phase 2 Cardiac Rehab based on patient barriers.: Yes   Diet - low sodium heart healthy   Complete by: As directed    Discharge instructions   Complete by: As directed    Radial Site Care Refer to this sheet in the next few weeks. These instructions provide you with information on caring for yourself after your procedure. Your caregiver may also give you more specific instructions. Your treatment has been planned according to current medical practices, but problems sometimes occur. Call your caregiver if you have any problems or questions after your procedure. HOME CARE INSTRUCTIONS You may shower the day after the procedure. Remove the bandage (dressing) and gently wash the site with plain soap and water. Gently pat the site dry.  Do not apply powder or lotion to the site.  Do not submerge the affected site in water for 3 to 5 days.  Inspect the site at least twice daily.  Do not flex or bend the affected arm for 24 hours.  No lifting over 5 pounds (2.3 kg) for 5 days after your procedure.  Do not drive home if you are discharged the same day of the procedure. Have someone else drive you.  You may drive 24 hours after the procedure unless otherwise instructed by your caregiver.  What to expect: Any bruising will usually fade within 1 to 2 weeks.  Blood that collects in the tissue (hematoma) may be painful to the touch. It should usually decrease in size and tenderness within 1 to 2 weeks.  SEEK IMMEDIATE MEDICAL CARE IF: You have unusual pain at the radial site.  You have redness, warmth, swelling, or pain at the radial site.  You have drainage (other than a small amount of blood on the dressing).  You have chills.  You have a fever or persistent symptoms for more than 72 hours.  You have a fever and your symptoms suddenly get worse.  Your arm becomes pale, cool, tingly, or numb.  You have heavy bleeding from the site. Hold pressure on the site.    PLEASE DO NOT MISS ANY DOSES OF YOUR PLAVIX!!!!! Also keep a log of you blood pressures and bring back to your follow up appt. Please call the office with any questions.   Patients taking blood thinners should generally stay away from medicines like ibuprofen, Advil, Motrin, naproxen, and Aleve due to risk of stomach bleeding. You may take Tylenol as directed or talk to your primary doctor about alternatives.  PLEASE ENSURE THAT YOU DO NOT RUN OUT OF YOUR BRILINTA/PLAVIX. This medication is very important to remain on for at least 6months. IF you have issues obtaining this medication due to cost please CALL the office 3-5 business days prior to running out in order to prevent missing doses of this medication.   Increase activity slowly   Complete by: As directed        Discharge Medications   Allergies as of 10/11/2020   No Known Allergies      Medication List     STOP taking these medications    ibuprofen 200 MG tablet Commonly known as: ADVIL   Premium Automatic BP Monitor Devi       TAKE these medications    amLODipine 10 MG tablet Commonly known as: NORVASC Take 1 tablet (10 mg total) by mouth daily.   aspirin EC 81 MG tablet Take 1 tablet (81 mg total) by mouth daily. Swallow whole.   atorvastatin 80 MG tablet Commonly known as: LIPITOR Take 1 tablet (80 mg total) by mouth daily. Start taking on: October 12, 2020 What changed:  medication strength how much to take   clopidogrel 75 MG tablet Commonly known as: PLAVIX Take 1 tablet (75 mg total) by mouth daily. Start taking on: October 12, 2020   gabapentin 800 MG tablet Commonly known as: NEURONTIN TAKE 1 TABLET (800 MG TOTAL) BY MOUTH 2 (TWO) TIMES DAILY. What changed:  how much to take how to take this when to take this reasons to take this   lisinopril-hydrochlorothiazide 20-12.5 MG tablet Commonly known as: ZESTORETIC Take 1 tablet by mouth daily.   nitroGLYCERIN 0.4 MG SL tablet Commonly  known as: Nitrostat Place 1 tablet (0.4 mg total) under the tongue every 5 (five) minutes as needed for chest pain.   OVER THE COUNTER MEDICATION Take 1-2 tablets by mouth daily. Testosterone booster supplement   OVER THE COUNTER MEDICATION Take 1 tablet by mouth daily. LongJack Tongkat Ali        Outstanding Labs/Studies   N/a   Duration of Discharge Encounter   Greater than 30 minutes including physician time.  Signed, Laverda Page, NP 10/11/2020, 1:16 PM  Agree with note by Laverda Page NP-C  Robert [name] was admitted for same-day cath.  He is a patient of Dr. Shari Prows.  He did have a coronary calcium score of 25 with an FFR of 0.75 and his RCA.  He underwent coronary angiography by Dr. Tresa Endo revealing high-grade proximal to mid RCA with anterior takeoff and had excellent result from PCI drug-eluting stenting.  He was initially placed on Brilinta but switch to Plavix.  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  We talked about risk factor modification including smoking cessation, high-dose statin therapy.  Stable for discharge today.  Follow-up with Dr. Tresa Endo initially and then with Dr. Shari Prows.  Runell Gess, M.D., FACP, [facility], [facility] [address] [place], [place]  [address]  [phone] 10/11/2020 2:20 PM

## 2020-10-11 NOTE — Progress Notes (Signed)
Pt turned off IV pump and removed left hand PIV on his own. When asked why, pt states it was uncomfortable. RN had assessed IV and explained importance of PIV several times this shift. Pt refused to have new IV placed. WCTM.

## 2020-10-12 ENCOUNTER — Telehealth: Payer: Self-pay

## 2020-10-12 NOTE — Telephone Encounter (Signed)
Transition Care Management Follow-up Telephone Call Date of discharge and from where: 10/11/2020, [facility] How have you been since you were released from the hospital? He said he doing okay; but he is very concerned that it has been 2 months since he was able to work.  He has no income and is working with an attorney to submit a disability application.   Any questions or concerns? Yes - noted above.  He would like Ms Randa Evens, NP to call him and he will also send her a MyChart message. He said that his attorney is requesting a letter from his PCP that addresses his medical conditions and need for disability.   Items Reviewed: Did the pt receive and understand the discharge instructions provided? Yes  Medications obtained and verified? Yes  -he said he has all medications and did not have any questions about his med regime.  Other? No  Any new allergies since your discharge? No  Do you have support at home? Yes   Home Care and Equipment/Supplies: Were home health services ordered? no If so, what is the name of the agency? N/a  Has the agency set up a time to come to the patient's home? not applicable Were any new equipment or medical supplies ordered?  No What is the name of the medical supply agency? N/a Were you able to get the supplies/equipment? not applicable Do you have any questions related to the use of the equipment or supplies? No  Functional Questionnaire: (I = Independent and D = Dependent) ADLs: independent   Follow up appointments reviewed:  PCP Hospital f/u appt confirmed? Yes  Scheduled to see Gwinda Passe, NP on 10/30/2020 @ 1430.  He said he is living in [place] and will probably need to find a new PCP closer to where he is living. Specialist Hospital f/u appt confirmed? Yes  Scheduled to see cardiology on 10/16/2020 Are transportation arrangements needed? Yes  If their condition worsens, is the pt aware to call PCP or go to the Emergency Dept.? Yes Was the  patient provided with contact information for the PCP's office or ED? Yes Was to pt encouraged to call back with questions or concerns? Yes

## 2020-10-12 NOTE — Procedures (Signed)
   Patient Name: Robert [name], Robert [name] Date:10/09/2020 Gender: Male D.O.B: 1965-12-18 Age (years): 55 Referring Provider: Sherryl Manges Height (inches): 72 Interpreting Physician: Armanda Magic MD, ABSM Weight (lbs): 186 RPSGT: Alfonso Ellis BMI: 25 MRN: [id number] Neck Size: 16.50  CLINICAL INFORMATION Sleep Study Type: NPSG  Indication for sleep study: N/A  Epworth Sleepiness Score: 12  SLEEP STUDY TECHNIQUE As per the AASM Manual for the Scoring of Sleep and Associated Events v2.3 (April 2016) with a hypopnea requiring 4% desaturations.  The channels recorded and monitored were frontal, central and occipital EEG, electrooculogram (EOG), submentalis EMG (chin), nasal and oral airflow, thoracic and abdominal wall motion, anterior tibialis EMG, snore microphone, electrocardiogram, and pulse oximetry.  MEDICATIONS Medications self-administered by patient taken the night of the study : N/A  SLEEP ARCHITECTURE The study was initiated at 11:01:13 PM and ended at 5:50:31 AM.  Sleep onset time was 2.3 minutes and the sleep efficiency was 97.6%. The total sleep time was 399.5 minutes.  Stage REM latency was 50.5 minutes.  The patient spent 3.00% of the night in stage N1 sleep, 72.97% in stage N2 sleep, 7.63% in stage N3 and 16.4% in REM.  Alpha intrusion was absent.  Supine sleep was 36.43%.  RESPIRATORY PARAMETERS The overall apnea/hypopnea index (AHI) was 1.5 per hour. There were 2 total apneas, including 0 obstructive, 2 central and 0 mixed apneas. There were 8 hypopneas and 0 RERAs.  The AHI during Stage REM sleep was 3.7 per hour.  AHI while supine was 3.3 per hour.  The mean oxygen saturation was 96.39%. The minimum SpO2 during sleep was 92.00%.  moderate snoring was noted during this study.  CARDIAC DATA The 2 lead EKG demonstrated sinus rhythm. The mean heart rate was 45.23 beats per minute. Other EKG findings include: PVCs.  LEG MOVEMENT DATA The total PLMS  were 0 with a resulting PLMS index of 0.00. Associated arousal with leg movement index was 0.0 .  IMPRESSIONS - No significant obstructive sleep apnea occurred during this study (AHI = 1.5/h). - No significant central sleep apnea occurred during this study (CAI = 0.3/h). - The patient had minimal or no oxygen desaturation during the study (Min O2 = 92.00%) - The patient snored with moderate snoring volume. - EKG findings include PVCs. - Clinically significant periodic limb movements did not occur during sleep. No significant associated arousals.  DIAGNOSIS - Normal study  RECOMMENDATIONS - Avoid alcohol, sedatives and other CNS depressants that may worsen sleep apnea and disrupt normal sleep architecture. - Sleep hygiene should be reviewed to assess factors that may improve sleep quality. - Weight management and regular exercise should be initiated or continued if appropriate.  [Electronically signed] 10/12/2020 08:24 PM  Armanda Magic MD, ABSM Diplomate, American Board of Sleep Medicine   NPI: [id number]

## 2020-10-13 ENCOUNTER — Telehealth (HOSPITAL_COMMUNITY): Payer: Self-pay

## 2020-10-13 NOTE — Telephone Encounter (Signed)
Per phase I cardiac rehab, fax cardiac rehab referral to [facility]. 

## 2020-10-16 ENCOUNTER — Ambulatory Visit: Payer: Self-pay | Admitting: Internal Medicine

## 2020-10-20 ENCOUNTER — Other Ambulatory Visit: Payer: Self-pay

## 2020-10-20 ENCOUNTER — Encounter: Payer: Self-pay | Admitting: Internal Medicine

## 2020-10-20 ENCOUNTER — Ambulatory Visit: Payer: Medicaid Other

## 2020-10-20 ENCOUNTER — Ambulatory Visit: Payer: Medicaid Other | Admitting: Internal Medicine

## 2020-10-20 VITALS — BP 135/85 | HR 77 | Ht 72.0 in | Wt 176.6 lb

## 2020-10-20 DIAGNOSIS — I495 Sick sinus syndrome: Secondary | ICD-10-CM

## 2020-10-20 DIAGNOSIS — I639 Cerebral infarction, unspecified: Secondary | ICD-10-CM | POA: Diagnosis not present

## 2020-10-20 DIAGNOSIS — I455 Other specified heart block: Secondary | ICD-10-CM | POA: Diagnosis not present

## 2020-10-20 NOTE — Progress Notes (Unsigned)
Enrolled patient for a 14 day Zio XT  monitor to be mailed to patients home  °

## 2020-10-20 NOTE — Telephone Encounter (Signed)
Since PCP is out of the office, he would need to be scheduled with any Clinician with an available opening to address this.Thanks
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
## 2020-10-20 NOTE — Patient Instructions (Signed)
Medication Instructions:  Your physician recommends that you continue on your current medications as directed. Please refer to the Current Medication list given to you today.  Labwork: None ordered.  Testing/Procedures: Christena Deem- Long Term Monitor Instructions  Your physician has requested you wear a ZIO patch monitor for 14 days.  This is a single patch monitor. [employer] supplies one patch monitor per enrollment. Additional stickers are not available. Please do not apply patch if you will be having a Nuclear Stress Test,  Echocardiogram, Cardiac CT, MRI, or Chest Xray during the period you would be wearing the  monitor. The patch cannot be worn during these tests. You cannot remove and re-apply the  ZIO XT patch monitor.  Your ZIO patch monitor will be mailed 3 day USPS to your address on file. It may take 3-5 days  to receive your monitor after you have been enrolled.  Once you have received your monitor, please review the enclosed instructions. Your monitor  has already been registered assigning a specific monitor serial # to you.  Billing and Patient Assistance Program Information  We have supplied [employer] with any of your insurance information on file for billing purposes. [employer] offers a sliding scale Patient Assistance Program for patients that do not have  insurance, or whose insurance does not completely cover the cost of the ZIO monitor.  You must apply for the Patient Assistance Program to qualify for this discounted rate.  To apply, please call [employer] at [phone], select option 4, select option 2, ask to apply for  Patient Assistance Program. Meredeth Ide will ask your household income, and how many people  are in your household. They will quote your out-of-pocket cost based on that information.  [employer] will also be able to set up a 87-month, interest-free payment plan if needed.  Applying the monitor   Shave hair from upper left chest.  Hold abrader disc by orange tab.  Rub abrader in 40 strokes over the upper left chest as  indicated in your monitor instructions.  Clean area with 4 enclosed alcohol pads. Let dry.  Apply patch as indicated in monitor instructions. Patch will be placed under collarbone on left  side of chest with arrow pointing upward.  Rub patch adhesive wings for 2 minutes. Remove white label marked "1". Remove the white  label marked "2". Rub patch adhesive wings for 2 additional minutes.  While looking in a mirror, press and release button in center of patch. A small green light will  flash 3-4 times. This will be your only indicator that the monitor has been turned on.  Do not shower for the first 24 hours. You may shower after the first 24 hours.  Press the button if you feel a symptom. You will hear a small click. Record Date, Time and  Symptom in the Patient Logbook.  When you are ready to remove the patch, follow instructions on the last 2 pages of Patient  Logbook. Stick patch monitor onto the last page of Patient Logbook.  Place Patient Logbook in the blue and white box. Use locking tab on box and tape box closed  securely. The blue and white box has prepaid postage on it. Please place it in the mailbox as  soon as possible. Your physician should have your test results approximately 7 days after the  monitor has been mailed back to [facility].  Call [facility] Customer Care at [phone] if you have questions regarding  your ZIO XT patch monitor. Call them  immediately if you see an orange light blinking on your  monitor.  If your monitor falls off in less than 4 days, contact our Monitor department at [phone].  If your monitor becomes loose or falls off after 4 days call [employer] at [phone] for  suggestions on securing your monitor   Follow-Up: Your physician recommends that you schedule a follow-up appointment in:   Determined by monitor results  You have a tentative pacemaker insertion date on Sept  12, 2022. If your monitor is abnormal, we will call you to discuss pre procedure instructions.   Any Other Special Instructions Will Be Listed Below (If Applicable).     If you need a refill on your cardiac medications before your next appointment, please call your pharmacy.

## 2020-10-23 ENCOUNTER — Other Ambulatory Visit (HOSPITAL_COMMUNITY): Payer: Self-pay

## 2020-10-23 ENCOUNTER — Telehealth (HOSPITAL_COMMUNITY): Payer: Self-pay

## 2020-10-23 ENCOUNTER — Other Ambulatory Visit: Payer: Self-pay | Admitting: *Deleted

## 2020-10-23 ENCOUNTER — Encounter: Payer: Self-pay | Admitting: *Deleted

## 2020-10-23 ENCOUNTER — Other Ambulatory Visit: Payer: Self-pay

## 2020-10-23 ENCOUNTER — Ambulatory Visit (INDEPENDENT_AMBULATORY_CARE_PROVIDER_SITE_OTHER): Payer: Medicaid Other

## 2020-10-23 DIAGNOSIS — I455 Other specified heart block: Secondary | ICD-10-CM

## 2020-10-23 DIAGNOSIS — I495 Sick sinus syndrome: Secondary | ICD-10-CM

## 2020-10-23 DIAGNOSIS — I6521 Occlusion and stenosis of right carotid artery: Secondary | ICD-10-CM | POA: Diagnosis not present

## 2020-10-23 DIAGNOSIS — M47812 Spondylosis without myelopathy or radiculopathy, cervical region: Secondary | ICD-10-CM | POA: Diagnosis not present

## 2020-10-23 DIAGNOSIS — M4802 Spinal stenosis, cervical region: Secondary | ICD-10-CM | POA: Diagnosis not present

## 2020-10-23 DIAGNOSIS — M5412 Radiculopathy, cervical region: Secondary | ICD-10-CM | POA: Diagnosis not present

## 2020-10-23 DIAGNOSIS — M542 Cervicalgia: Secondary | ICD-10-CM | POA: Diagnosis not present

## 2020-10-23 DIAGNOSIS — M5030 Other cervical disc degeneration, unspecified cervical region: Secondary | ICD-10-CM | POA: Diagnosis not present

## 2020-10-23 DIAGNOSIS — I639 Cerebral infarction, unspecified: Secondary | ICD-10-CM | POA: Diagnosis not present

## 2020-10-23 DIAGNOSIS — G9520 Unspecified cord compression: Secondary | ICD-10-CM | POA: Diagnosis not present

## 2020-10-23 NOTE — Progress Notes (Signed)
Patient ID: Robert [name], male   DOB: 1965-09-14, 55 y.o.   MRN: [id number] On 10/20/20 Orpha Bur had process an order for a 14 day ZIO XT monitor to be mailed to the patients home.  On 10/23/20 the patient showed up at our office stating he was told be Dr. Graciela Husbands to stop in and have one put on.   Order placed 10/20/20 was cancelled , and [employer] representative notified.  That order will be for serial # [id number].  Patient will hold on to that monitor once he receives is.  After wearing the 14 day ZIO XT that was applied in our office 10/23/20, serial # [id number], for 4 days the patient can mail back the first monitor. If the second monitor falls off in less than 4 days, the patient can apply the second monitor.  We will need to contact [employer] with a new order if he needs to use the first monitor   N [phone].  Patient was concerned over delays in results since monitor needs to be mailed back.  We did not want to create possible additional delays if replacement monitor was needed. Patient also brought back his completed [employer] 30 day cardiac event monitor.  That was left up at front  desk to give to [employer] delivery.

## 2020-10-23 NOTE — Telephone Encounter (Signed)
Pharmacy Transitions of Care Follow-up Telephone Call  Date of discharge: 10/12/20  Discharge Diagnosis: stent placement  How have you been since you were released from the hospital?  Patient has been well since discharge, no questions about meds at this time.  Medication changes made at discharge:      START taking: clopidogrel (PLAVIX)  nitroGLYCERIN (Nitrostat)  CHANGE how you take: atorvastatin (LIPITOR)  STOP taking: ibuprofen 200 MG tablet (ADVIL)   Medication changes verified by the patient? Yes    Medication Accessibility:  Home Pharmacy:  [facility]  Was the patient provided with refills on discharged medications? Yes   Have all prescriptions been [facility] from [facility] to home pharmacy?  No, will leave in system until needed  Is the patient able to afford medications? Patient has [facility] insurance    Medication Review:  CLOPIDOGREL (PLAVIX) Clopidogrel 75 mg once daily.  - Educated patient on expected duration of therapy of ASA 81 mg with clopidogrel.  - Advised patient of medications to avoid (NSAIDs, ASA)  - Educated that Tylenol (acetaminophen) will be the preferred analgesic to prevent risk of bleeding  - Emphasized importance of monitoring for signs and symptoms of bleeding (abnormal bruising, prolonged bleeding, nose bleeds, bleeding from gums, discolored urine, black tarry stools)  - Advised patient to alert all providers of anticoagulation therapy prior to starting a new medication or having a procedure   Follow-up Appointments:  PCP Hospital f/u appt confirmed?  Scheduled to see Dr. Randa Evens on 10/30/20 @ Fam Med.   Specialist Hospital f/u appt confirmed?  Scheduled to see Dr. Graciela Husbands on 10/20/20 @ Cardiology.   If their condition worsens, is the pt aware to call PCP or go to the Emergency Dept.? Yes  Final Patient Assessment: Patient has follow up scheduled and refills at home pharmacy

## 2020-10-23 NOTE — Telephone Encounter (Signed)
Attempted to contact patient to follow up regarding disability application, voicemail full, unable to leave a message

## 2020-10-23 NOTE — Progress Notes (Unsigned)
14 day ZIO XT [id number] applied in office. Order processed 10/20/20 cancelled.  Patient will mail back.

## 2020-10-24 NOTE — Telephone Encounter (Signed)
Call placed to patient regarding disability paperwork.  He said that he has not spoken to Ms Randa Evens, NP yet about the documents needed and he will wait to see her at his appointment with her next week - 10/30/2020.  He did not want to see another provider in the meantime.

## 2020-10-30 ENCOUNTER — Telehealth: Payer: Self-pay | Admitting: Internal Medicine

## 2020-10-30 ENCOUNTER — Other Ambulatory Visit: Payer: Self-pay

## 2020-10-30 ENCOUNTER — Ambulatory Visit (INDEPENDENT_AMBULATORY_CARE_PROVIDER_SITE_OTHER): Payer: Medicaid Other | Admitting: Primary Care

## 2020-10-30 ENCOUNTER — Other Ambulatory Visit (HOSPITAL_COMMUNITY): Payer: Self-pay

## 2020-10-30 ENCOUNTER — Encounter (INDEPENDENT_AMBULATORY_CARE_PROVIDER_SITE_OTHER): Payer: Self-pay | Admitting: Primary Care

## 2020-10-30 VITALS — BP 135/81 | HR 62 | Temp 97.7°F | Ht 72.0 in | Wt 180.0 lb

## 2020-10-30 DIAGNOSIS — M5412 Radiculopathy, cervical region: Secondary | ICD-10-CM

## 2020-10-30 DIAGNOSIS — H539 Unspecified visual disturbance: Secondary | ICD-10-CM | POA: Diagnosis not present

## 2020-10-30 DIAGNOSIS — Z720 Tobacco use: Secondary | ICD-10-CM | POA: Diagnosis not present

## 2020-10-30 DIAGNOSIS — Z1211 Encounter for screening for malignant neoplasm of colon: Secondary | ICD-10-CM | POA: Diagnosis not present

## 2020-10-30 DIAGNOSIS — I10 Essential (primary) hypertension: Secondary | ICD-10-CM | POA: Diagnosis not present

## 2020-10-30 DIAGNOSIS — F329 Major depressive disorder, single episode, unspecified: Secondary | ICD-10-CM

## 2020-10-30 DIAGNOSIS — I639 Cerebral infarction, unspecified: Secondary | ICD-10-CM

## 2020-10-30 DIAGNOSIS — I69398 Other sequelae of cerebral infarction: Secondary | ICD-10-CM | POA: Diagnosis not present

## 2020-10-30 MED ORDER — GABAPENTIN 800 MG PO TABS
ORAL_TABLET | ORAL | 1 refills | Status: DC
  Filled 2020-10-30: qty 60, 30d supply, fill #0

## 2020-10-30 NOTE — Telephone Encounter (Addendum)
Spoke with pt who states he does not have a question about upcoming procedure with Dr Graciela Husbands.  He states Dr Tresa Endo refilled Gabapentin on 10/10/2020 at Heart Cath procedure and medication was sent to pharmacy but pharmacy has not received refill.  Gabapentin resent to pharmacy on file as medication was originally ordered on 10/10/2020 as "No Print"  Pt advised medication now be available for pick up.  Pt verbalized understanding and thanked Charity fundraiser for the call.
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
## 2020-10-30 NOTE — Patient Instructions (Signed)
http://APA.org/depression-guideline"> https://clinicalkey.com"> http://point-of-care.elsevierperformancemanager.com/skills/"> http://point-of-care.elsevierperformancemanager.com">  Managing Depression, Adult Depression is a mental health condition that affects your thoughts, feelings, and actions. Being diagnosed with depression can bring you relief if you did not know why you have felt or behaved a certain way. It could also leave you feeling overwhelmed with uncertainty about your future. Preparing yourself tomanage your symptoms can help you feel more positive about your future. How to manage lifestyle changes Managing stress  Stress is your body's reaction to life changes and events, both good and bad. Stress can add to your feelings of depression. Learning to manage your stresscan help lessen your feelings of depression. Try some of the following approaches to reducing your stress (stress reduction techniques): Listen to music that you enjoy and that inspires you. Try using a meditation app or take a meditation class. Develop a practice that helps you connect with your spiritual self. Walk in nature, pray, or go to a place of worship. Do some deep breathing. To do this, inhale slowly through your nose. Pause at the top of your inhale for a few seconds and then exhale slowly, letting your muscles relax. Practice yoga to help relax and work your muscles. Choose a stress reduction technique that suits your lifestyle and personality. These techniques take time and practice to develop. Set aside 5-15 minutes a day to do them. Therapists can offer training in these techniques. Other things you can do to manage stress include: Keeping a stress diary. Knowing your limits and saying no when you think something is too much. Paying attention to how you react to certain situations. You may not be able to control everything, but you can change your reaction. Adding humor to your life by watching funny films  or TV shows. Making time for activities that you enjoy and that relax you.  Medicines Medicines, such as antidepressants, are often a part of treatment for depression. Talk with your pharmacist or health care provider about all the medicines, supplements, and herbal products that you take, their possible side effects, and what medicines and other products are safe to take together. Make sure to report any side effects you may have to your health care provider. Relationships Your health care provider may suggest family therapy, couples therapy, orindividual therapy as part of your treatment. How to recognize changes Everyone responds differently to treatment for depression. As you recover from depression, you may start to: Have more interest in doing activities. Feel less hopeless. Have more energy. Overeat less often, or have a better appetite. Have better mental focus. It is important to recognize if your depression is not getting better or is getting worse. The symptoms you had in the beginning may return, such as: Tiredness (fatigue) or low energy. Eating too much or too little. Sleeping too much or too little. Feeling restless, agitated, or hopeless. Trouble focusing or making decisions. Unexplained physical complaints. Feeling irritable, angry, or aggressive. If you or your family members notice these symptoms coming back, let yourhealth care provider know right away. Follow these instructions at home: Activity  Try to get some form of exercise each day, such as walking, biking, swimming, or lifting weights. Practice stress reduction techniques. Engage your mind by taking a class or doing some volunteer work.  Lifestyle Get the right amount and quality of sleep. Cut down on using caffeine, tobacco, alcohol, and other potentially harmful substances. Eat a healthy diet that includes plenty of vegetables, fruits, whole grains, low-fat dairy products, and lean protein. Do not   eat  a lot of foods that are high in solid fats, added sugars, or salt (sodium). General instructions Take over-the-counter and prescription medicines only as told by your health care provider. Keep all follow-up visits as told by your health care provider. This is important. Where to find support Talking to others  Friends and family members can be sources of support and guidance. Talk to trusted friends or family members about your condition. Explain your symptoms to them, and let them know that you are working with a health care provider to treat your depression. Tell friends and family members how they also can behelpful. Finances Find appropriate mental health providers that fit with your financial situation. Talk with your health care provider about options to get reduced prices on your medicines. Where to find more information You can find support in your area from: Anxiety and Depression Association of America (ADAA): www.adaa.org Mental Health America: www.mentalhealthamerica.net National Alliance on Mental Illness: www.nami.org Contact a health care provider if: You stop taking your antidepressant medicines, and you have any of these symptoms: Nausea. Headache. Light-headedness. Chills and body aches. Not being able to sleep (insomnia). You or your friends and family think your depression is getting worse. Get help right away if: You have thoughts of hurting yourself or others. If you ever feel like you may hurt yourself or others, or have thoughts about taking your own life, get help right away. Go to your nearest emergency department or: Call your local emergency services (911 in the [place] Call a suicide crisis helpline, such as the National Suicide Prevention Lifeline at 1-800-273-8255. This is open 24 hours a day in the [place] Text the Crisis Text Line at 741741 (in the [place] Summary If you are diagnosed with depression, preparing yourself to manage your symptoms is a good way  to feel positive about your future. Work with your health care provider on a management plan that includes stress reduction techniques, medicines (if applicable), therapy, and healthy lifestyle habits. Keep talking with your health care provider about how your treatment is working. If you have thoughts about taking your own life, call a suicide crisis helpline or text a crisis text line. This information is not intended to replace advice given to you by your health care provider. Make sure you discuss any questions you have with your healthcare provider. Document Revised: 01/06/2019 Document Reviewed: 01/06/2019 Elsevier Patient Education  2022 Elsevier Inc.  

## 2020-10-30 NOTE — Telephone Encounter (Signed)
Patient is calling in regards to a procedure he is having

## 2020-11-02 ENCOUNTER — Telehealth: Payer: Self-pay | Admitting: *Deleted

## 2020-11-02 NOTE — Telephone Encounter (Signed)
Informed patient of sleep study results and patient understanding was verbalized. Patient understands his sleep study showed no significant sleep apnea.   Pt is aware and agreeable to normal results. 

## 2020-11-02 NOTE — Telephone Encounter (Signed)
-----   Message from Quintella Reichert, MD sent at 10/12/2020  8:25 PM EDT ----- Please let patient know that sleep study showed no significant sleep apnea.

## 2020-11-06 ENCOUNTER — Telehealth: Payer: Self-pay | Admitting: Internal Medicine

## 2020-11-06 NOTE — Telephone Encounter (Signed)
Spoke with pt who is concerned because no one has called him about his monitor and whether it is working or not and want to know what he is supposed to do with it after he has worn it  for 2 weeks.  Advised I will forward this to the person who put his monitor on and she will call back.

## 2020-11-06 NOTE — Telephone Encounter (Signed)
Instructed patient to remove 14 day ZIO patch monitor which was applied in office on 10/23/20 and mail it back to [employer] in orange and white box with prepaid postage on it.  Dr. Koren Bound nurse should call you with your results in about a week. Mail the other ZIO patch in the blue in white box back to [employer] as well.  There will be no charge for that monitor since it was not applied and turned on.

## 2020-11-06 NOTE — Telephone Encounter (Signed)
   Pt would like to speak with Dr.Klein's nurse regarding his heart monitor

## 2020-11-07 NOTE — Progress Notes (Deleted)
Cardiology Clinic Note   Patient Name: Robert [name] Ontko Date of Encounter: 11/07/2020  Primary Care Provider:  Grayce SessionsEdwards, Michelle P, NP Primary Cardiologist:  Meriam SpragueHeather E Pemberton, MD  Patient Profile    Robert [name] Baumert 55 year old male presents in the clinic today for follow-up evaluation of her exertional dyspnea and coronary artery disease.  Past Medical History    Past Medical History:  Diagnosis Date   Coronary artery disease    stent RCA 8/22   Hypertension    Sinus arrest    mostly nocturnal   Past Surgical History:  Procedure Laterality Date   CORONARY STENT INTERVENTION N/A 10/10/2020   Procedure: CORONARY STENT INTERVENTION;  Surgeon: Lennette BihariKelly, Thomas A, MD;  Location: [facility];  Service: Cardiovascular;  Laterality: N/A;   LEFT HEART CATH AND CORONARY ANGIOGRAPHY N/A 10/10/2020   Procedure: LEFT HEART CATH AND CORONARY ANGIOGRAPHY;  Surgeon: Lennette BihariKelly, Thomas A, MD;  Location: [facility];  Service: Cardiovascular;  Laterality: N/A;   TONSILLECTOMY AND ADENOIDECTOMY      Allergies  No Known Allergies  History of Present Illness    Robert [name] Dirk has a PMH of single-vessel coronary artery disease including his proximal RCA and mild luminal irregularity.  He underwent cardiac catheterization on 10/10/2020 and received PCI with DES x1.  His PMH also includes hyperlipidemia, hypertension, tobacco abuse, and CVA.  He was found to have sinus pauses on his cardiac monitor.  He was admitted to the hospital on 07/09/2020 until 07/12/2020.  During that time he reported left hand weakness, slurred speech, and facial droop.  An MRI during his admission showed scattered general small cortical and subcortical white matter infarcts and right MCA territory with some involvement in the lateral right PCA.  A CT angio showed abrupt occlusion of his right ICA just distal to bifurcation.  He was initially administered dual antiplatelet therapy with aspirin and Plavix.  He underwent an MRI  which showed evidence of additional petechial hemorrhaging in the posterior right MCA territory and small volume intravertebral hemorrhage in the fourth ventricular which had not been evident on his original CT.  His dual antiplatelet therapy was then held and he was discharged on aspirin.  His hypercoagulability panel was negative.  He wore a cardiac event monitor for 30 days which showed several episodes of sinus pause lasting up to 8.6 seconds.  It was felt that he was sleeping at the time.  He denied lightheadedness, dizziness and syncope.  He was referred to the emergency department and seen by Dr. Graciela HusbandsKlein who recommended a sleep study and continued monitoring.  He unfortunately missed several follow-up appointments.  When he was seen in the clinic on 7/20 he continued to have residual deficits from his CVA which included left leg weakness, blurred vision, and left-sided facial numbness.  Weakness in his left hand was also noted.  A plan was made to have an evaluation by neurosurgery for concern of nerve compression of a slipped disc.  He was unable to be seen sooner due to lack of insurance.  He was seen by Dr. Allyson SabalBerry on 10/11/2020.  During that time he reported episodes of lightheadedness and weakness with exertion or when he was in the sun for extended periods of time.  He found that he would need to sit down in order to have his symptoms resolved.  He also reported episodes of chest heaviness and shortness of breath with exertion.  His symptoms appear to be progressing.  He reported that  he would have to stop walking as he walked from his car to [employer].  He denied syncope, palpitations, orthopnea, PND, lower extremity swelling.  He reported snoring.  His home blood pressures have been running in the 130s systolic.  He continues to smoke about 1 pack/day but was trying to quit.  He underwent cardiac catheterization on 10/10/2020 which showed a significant single-vessel coronary artery disease 80% proximal  RCA.  He was treated with PCI and DES x1.  He was placed on dual antiplatelet therapy aspirin/Brilinta but was switched to Plavix prior to discharge with a 300 mg loading dose.  This change was made after reaching out to neurology given his history with petechial hemorrhage.  At that time he was greater than 2 months past his CVA.  There is no indication for repeat imaging while on dual antiplatelet therapy unless patient developed acute neurological symptoms.  His Norvasc, statin and Zestoretic were continued.  He ambulated with cardiac rehab and denied chest discomfort.  He presents the clinic today for follow-up evaluation states***  *** denies chest pain, shortness of breath, lower extremity edema, fatigue, palpitations, melena, hematuria, hemoptysis, diaphoresis, weakness, presyncope, syncope, orthopnea, and PND.   Home Medications    Prior to Admission medications   Medication Sig Start Date End Date Taking? Authorizing Provider  amLODipine (NORVASC) 10 MG tablet Take 1 tablet (10 mg total) by mouth daily. 10/11/20 10/11/21  Lennette Bihari, MD  aspirin EC 81 MG tablet Take 1 tablet (81 mg total) by mouth daily. Swallow whole. 07/12/20   Hongalgi, Maximino Greenland, MD  atorvastatin (LIPITOR) 80 MG tablet Take 1 tablet (80 mg total) by mouth daily. 10/12/20   Lennette Bihari, MD  clopidogrel (PLAVIX) 75 MG tablet Take 1 tablet (75 mg total) by mouth daily. 10/12/20   Lennette Bihari, MD  gabapentin (NEURONTIN) 800 MG tablet TAKE 1 TABLET (800 MG TOTAL) BY MOUTH 2 (TWO) TIMES DAILY. 10/30/20   Lennette Bihari, MD  lisinopril-hydrochlorothiazide (ZESTORETIC) 20-12.5 MG tablet Take 1 tablet by mouth daily. 07/31/20   Lorre Nick, MD  nitroGLYCERIN (NITROSTAT) 0.4 MG SL tablet Place 1 tablet (0.4 mg total) under the tongue every 5 (five) minutes as needed for chest pain. 10/11/20 10/11/21  Lennette Bihari, MD  OVER THE COUNTER MEDICATION Take 1-2 tablets by mouth daily. Testosterone booster supplement    [provider]  OVER THE COUNTER MEDICATION Take 1 tablet by mouth daily. LongJack Arbutus Ped    [provider]    Family History    Family History  Problem Relation Age of Onset   Hypertension Mother    Hypertension Father    Hypertension Brother    He indicated that his mother is alive. He indicated that his father is alive. He indicated that all of his three sisters are alive. He indicated that his brother is alive. He indicated that his daughter is alive. He indicated that his son is alive.  Social History    Social History   Socioeconomic History   Marital status: Single    Spouse name: Not on file   Number of children: 2   Years of education: HS diploma and some college   Highest education level: Not on file  Occupational History   Occupation: truck driver    Comment: [employer]  Tobacco Use   Smoking status: Every Day    Packs/day: 0.50    Types: Cigarettes   Smokeless tobacco: Never  Substance and  Sexual Activity   Alcohol use: Yes    Alcohol/week: 0.0 standard drinks   Drug use: No   Sexual activity: Not Currently  Other Topics Concern   Not on file  Social History Narrative   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  Cardiovascular:  No chest pain, dyspnea on exertion, edema, orthopnea, palpitations, paroxysmal nocturnal dyspnea. Dermatological: No rash, lesions/masses Respiratory: No cough, dyspnea Urologic: No hematuria, dysuria Abdominal:   No nausea, vomiting, diarrhea, bright red blood per rectum, melena, or hematemesis Neurologic:  No visual  changes, wkns, changes in mental status. All other systems reviewed and are otherwise negative except as noted above.  Physical Exam    VS:  There were no vitals taken for this visit. , BMI There is no height or weight on file to calculate BMI. GEN: Well nourished, well developed, in no acute distress. HEENT: normal. Neck: Supple, no JVD, carotid bruits, or masses. Cardiac: RRR, no murmurs, rubs, or gallops. No clubbing, cyanosis, edema.  Radials/DP/PT 2+ and equal bilaterally.  Respiratory:  Respirations regular and unlabored, clear to auscultation bilaterally. GI: Soft, nontender, nondistended, BS + x 4. MS: no deformity or atrophy. Skin: warm and dry, no rash. Neuro:  Strength and sensation are intact. Psych: Normal affect.  Accessory Clinical Findings    Recent Labs: 07/09/2020: ALT 16 07/10/2020: TSH 0.552 10/11/2020: BUN 11; Creatinine, Ser 1.05; Hemoglobin 13.8; Platelets 213; Potassium 3.3; Sodium 138   Recent Lipid Panel    Component Value Date/Time   CHOL 185 07/10/2020 0019   TRIG 120 07/10/2020 0019   HDL 43 07/10/2020 0019   CHOLHDL 4.3 07/10/2020 0019   VLDL 24 07/10/2020 0019   LDLCALC 118 (H) 07/10/2020 0019    ECG personally reviewed by me today- *** - No acute changes  Echocardiogram 07/10/2020 IMPRESSIONS     1. Left ventricular ejection fraction, by estimation, is 55 to 60%. The  left ventricle has normal function. The left ventricle has no regional  wall motion abnormalities. Left ventricular diastolic parameters are  consistent with Grade I diastolic  dysfunction (impaired relaxation).   2. Right ventricular systolic function is normal. The right ventricular  size is normal. There is normal pulmonary artery systolic pressure. The  estimated right ventricular systolic pressure is 27.2 mmHg.   3. Negative bubble study, no evidence for PFO or ASD.   4. The mitral valve is normal in structure. Trivial mitral valve  regurgitation. No evidence of mitral  stenosis.   5. The aortic valve is tricuspid. Aortic valve regurgitation is not  visualized. No aortic stenosis is present.   6. The inferior vena cava is normal in size with greater than 50%  respiratory variability, suggesting right atrial pressure of 3 mmHg.  Cardiac catheterization 10/10/2020   Mid LAD lesion is 20% stenosed.   Mid Cx lesion is 5% stenosed.   Prox RCA lesion is 80% stenosed.   Dist RCA lesion is 20% stenosed.   A stent was successfully placed.   Post intervention, there is a 0% residual stenosis.  Significant single-vessel CAD with 80% napkin ring like stenosis in the proximal RCA with mild luminal irregularity of the remainder of the vessel and 20% stenosis proximal to the acute margin.  There is mild nonobstructive smooth plaque of 20% in the mid LAD and 5% in the left circumflex vessel.   LVEDP 11 mmHg.   Successful percutaneous coronary intervention to the proximal RCA with ultimate insertion of a 3.0 x 15 mm Resolute Frontier stent postdilated to 3.34 millimeters with the stenosis being reduced to 0% and brisk TIMI-3 flow.   RECOMMENDATIION: DAPT for minimum of 6 months but preferably a year.  Smoking cessation is imperative.  Aggressive lipid-lowering therapy with target LDL less than 70.  Optimal blood pressure control.  Diagnostic Dominance: Right Intervention    Assessment & Plan   1.  Coronary artery disease-status post cardiac catheterization with PCI and DES x1 on 10/10/2020.  Details above. Continue aspirin, Plavix, Atorvastatin, nitroglycerin Heart healthy low-sodium diet-salty 6 given Increase physical activity as tolerated  Hyperlipidemia-07/10/2020: Cholesterol 185; HDL 43; LDL Cholesterol 118; Triglycerides 120; VLDL 24 Continue aspirin, atorvastatin Heart healthy low-sodium diet-salty 6 given Increase physical activity as tolerated Repeat fasting lipids and LFTs   Essential hypertension-BP today***.  Well-controlled at home. Continue  lisinopril, HCTZ Heart healthy low-sodium diet-salty 6 given Increase physical activity as tolerated  CVA-continues with slight left-sided residual effect.  (Left hand weakness, left leg weakness).  Admitted 5/22.  MRI during his admission showed scattered general small cortical and subcortical white matter infarcts and right MCA territory with some involvement in the lateral right PCA.  A CT angio showed abrupt occlusion of his right ICA just distal to bifurcation.  He was initially administered dual antiplatelet therapy with aspirin and Plavix. Follows with PCP  Disposition: Follow-up with Dr. Shari Prows in 3-4 months.  Thomasene Ripple. Aimar Shrewsbury NP-C    11/07/2020, 7:44 AM [facility] [address] Office [phone] Fax [phone]  Notice: This dictation was prepared with Dragon dictation along with smaller phrase technology. Any transcriptional errors that result from this process are unintentional and may not be corrected upon review.  I spent***minutes examining this patient, reviewing medications, and using patient centered shared decision making involving her cardiac care.  Prior to her visit I spent greater than 20 minutes reviewing her past medical history,  medications, and prior cardiac tests.

## 2020-11-09 ENCOUNTER — Ambulatory Visit: Payer: Medicaid Other | Admitting: General Practice

## 2020-11-11 ENCOUNTER — Telehealth: Payer: Self-pay | Admitting: Home Health

## 2020-11-11 NOTE — Telephone Encounter (Signed)
Received page from [facility] reporting patient was placed on zio monitor from 10/23/20-11/06/20, noted patient had 148 episodes pauses, longest pause is on 11/01/20 at 8:45AM [place] time.  Official report is available online, will be faxed to primary ordering physician and out office.   Reached out to the patient, spoke to patient directly today, patient states he has been doing okay, denied any chest pain, shortness of breath, dizziness, syncope.    Chart reviewed, patient has been seeing Dr. Graciela Husbands for sinus pauses up to 8.6 seconds, self reports he has PPM arranged in next 2 weeks. He is not on any rate slowing agents.  Advised patient go to the ER if develop chest pain, shortness of breath, dizziness, syncope.  Otherwise follow-up with Dr. Vincenza Hews as scheduled.

## 2020-11-14 ENCOUNTER — Telehealth: Payer: Self-pay | Admitting: Internal Medicine

## 2020-11-14 NOTE — Telephone Encounter (Signed)
Pt sent a Mychart message wanting to know about his results from the Monitor.  He stated he is needing the results before 9/12 when he has surgery.  Best number to contact him is [phone]

## 2020-11-16 ENCOUNTER — Institutional Professional Consult (permissible substitution) (INDEPENDENT_AMBULATORY_CARE_PROVIDER_SITE_OTHER): Payer: Medicaid Other | Admitting: Clinical

## 2020-11-20 ENCOUNTER — Ambulatory Visit (HOSPITAL_COMMUNITY)
Admission: RE | Disposition: A | Discharge status: Home / Self Care | Payer: Medicaid Other | Source: Home / Self Care | Attending: Internal Medicine

## 2020-11-20 ENCOUNTER — Ambulatory Visit (HOSPITAL_COMMUNITY)
Admission: RE | Admit: 2020-11-20 | Discharge: 2020-11-20 | Disposition: A | Discharge status: Home / Self Care | Payer: Medicaid Other | Attending: Internal Medicine | Admitting: Internal Medicine

## 2020-11-20 ENCOUNTER — Other Ambulatory Visit: Payer: Self-pay

## 2020-11-20 ENCOUNTER — Ambulatory Visit (HOSPITAL_COMMUNITY): Payer: Medicaid Other

## 2020-11-20 DIAGNOSIS — Z79899 Other long term (current) drug therapy: Secondary | ICD-10-CM | POA: Diagnosis not present

## 2020-11-20 DIAGNOSIS — Z7982 Long term (current) use of aspirin: Secondary | ICD-10-CM | POA: Insufficient documentation

## 2020-11-20 DIAGNOSIS — I495 Sick sinus syndrome: Secondary | ICD-10-CM | POA: Insufficient documentation

## 2020-11-20 DIAGNOSIS — Z7902 Long term (current) use of antithrombotics/antiplatelets: Secondary | ICD-10-CM | POA: Diagnosis not present

## 2020-11-20 DIAGNOSIS — I452 Bifascicular block: Secondary | ICD-10-CM | POA: Insufficient documentation

## 2020-11-20 DIAGNOSIS — F17210 Nicotine dependence, cigarettes, uncomplicated: Secondary | ICD-10-CM | POA: Insufficient documentation

## 2020-11-20 DIAGNOSIS — I2510 Atherosclerotic heart disease of native coronary artery without angina pectoris: Secondary | ICD-10-CM | POA: Insufficient documentation

## 2020-11-20 DIAGNOSIS — Z955 Presence of coronary angioplasty implant and graft: Secondary | ICD-10-CM | POA: Diagnosis not present

## 2020-11-20 DIAGNOSIS — Z8249 Family history of ischemic heart disease and other diseases of the circulatory system: Secondary | ICD-10-CM | POA: Insufficient documentation

## 2020-11-20 DIAGNOSIS — Z959 Presence of cardiac and vascular implant and graft, unspecified: Secondary | ICD-10-CM

## 2020-11-20 DIAGNOSIS — Z950 Presence of cardiac pacemaker: Secondary | ICD-10-CM | POA: Diagnosis not present

## 2020-11-20 HISTORY — PX: PACEMAKER IMPLANT: EP1218

## 2020-11-20 LAB — POCT I-STAT, CHEM 8
BUN: 20 mg/dL (ref 6–20)
Calcium, Ion: 1.19 mmol/L (ref 1.15–1.40)
Chloride: 103 mmol/L (ref 98–111)
Creatinine, Ser: 1.1 mg/dL (ref 0.61–1.24)
Glucose, Bld: 102 mg/dL — ABNORMAL HIGH (ref 70–99)
HCT: 38 % — ABNORMAL LOW (ref 39.0–52.0)
Hemoglobin: 12.9 g/dL — ABNORMAL LOW (ref 13.0–17.0)
Potassium: 4 mmol/L (ref 3.5–5.1)
Sodium: 142 mmol/L (ref 135–145)
TCO2: 30 mmol/L (ref 22–32)

## 2020-11-20 SURGERY — PACEMAKER IMPLANT

## 2020-11-20 MED ORDER — SODIUM CHLORIDE 0.9 % IV SOLN: INTRAVENOUS | Status: DC

## 2020-11-20 MED ORDER — CEFAZOLIN SODIUM-DEXTROSE 2-4 GM/100ML-% IV SOLN
2.0000 g | INTRAVENOUS | Status: AC
  Administered 2020-11-20: 2 g via INTRAVENOUS
  Filled 2020-11-20: qty 100

## 2020-11-20 MED ORDER — LIDOCAINE HCL 1 % IJ SOLN
INTRAMUSCULAR | Status: AC
  Filled 2020-11-20: qty 60

## 2020-11-20 MED ORDER — FENTANYL CITRATE (PF) 100 MCG/2ML IJ SOLN
INTRAMUSCULAR | Status: AC
  Filled 2020-11-20: qty 2

## 2020-11-20 MED ORDER — MIDAZOLAM HCL 5 MG/5ML IJ SOLN
INTRAMUSCULAR | Status: DC | PRN
  Administered 2020-11-20: 2 mg via INTRAVENOUS

## 2020-11-20 MED ORDER — SODIUM CHLORIDE 0.9 % IV SOLN
INTRAVENOUS | Status: AC
  Filled 2020-11-20: qty 2

## 2020-11-20 MED ORDER — HEPARIN (PORCINE) IN NACL 1000-0.9 UT/500ML-% IV SOLN
INTRAVENOUS | Status: DC | PRN
  Administered 2020-11-20: 500 mL

## 2020-11-20 MED ORDER — HEPARIN (PORCINE) IN NACL 1000-0.9 UT/500ML-% IV SOLN
INTRAVENOUS | Status: AC
  Filled 2020-11-20: qty 500

## 2020-11-20 MED ORDER — CEFAZOLIN SODIUM-DEXTROSE 2-4 GM/100ML-% IV SOLN
INTRAVENOUS | Status: AC
  Filled 2020-11-20: qty 100

## 2020-11-20 MED ORDER — LIDOCAINE HCL (PF) 1 % IJ SOLN
INTRAMUSCULAR | Status: DC | PRN
  Administered 2020-11-20: 60 mL

## 2020-11-20 MED ORDER — ONDANSETRON HCL 4 MG/2ML IJ SOLN: 4.0000 mg | Freq: Four times a day (QID) | INTRAMUSCULAR | Status: DC | PRN

## 2020-11-20 MED ORDER — FENTANYL CITRATE (PF) 100 MCG/2ML IJ SOLN
INTRAMUSCULAR | Status: DC | PRN
  Administered 2020-11-20: 25 ug via INTRAVENOUS

## 2020-11-20 MED ORDER — SODIUM CHLORIDE 0.9 % IV SOLN
80.0000 mg | INTRAVENOUS | Status: AC
  Administered 2020-11-20: 80 mg
  Filled 2020-11-20: qty 2

## 2020-11-20 MED ORDER — ACETAMINOPHEN 325 MG PO TABS
325.0000 mg | ORAL_TABLET | ORAL | Status: DC | PRN
  Filled 2020-11-20: qty 2

## 2020-11-20 MED ORDER — MIDAZOLAM HCL 5 MG/5ML IJ SOLN
INTRAMUSCULAR | Status: AC
  Filled 2020-11-20: qty 5

## 2020-11-20 SURGICAL SUPPLY — 13 items
CABLE SURGICAL S-101-97-12 (CABLE) ×3 IMPLANT
CATH RIGHTSITE C315HIS02 (CATHETERS) ×1 IMPLANT
HEMOSTAT SURGICEL 2X4 FIBR (HEMOSTASIS) ×1 IMPLANT
LEAD SELECT SECURE 3830 383069 (Lead) IMPLANT
LEAD TENDRIL MRI 52CM LPA1200M (Lead) ×1 IMPLANT
PACEMAKER ASSURITY DR-RF (Pacemaker) ×1 IMPLANT
PAD PRO RADIOLUCENT 2001M-C (PAD) ×2 IMPLANT
SELECT SECURE 3830 383069 (Lead) ×6 IMPLANT
SHEATH 7FR PRELUDE SNAP 13 (SHEATH) ×1 IMPLANT
SHEATH 8FR PRELUDE SNAP 13 (SHEATH) ×1 IMPLANT
SLITTER 6232ADJ (MISCELLANEOUS) ×1 IMPLANT
TRAY PACEMAKER INSERTION (PACKS) ×2 IMPLANT
WIRE HI TORQ VERSACORE-J 145CM (WIRE) ×1 IMPLANT

## 2020-11-20 NOTE — Discharge Instructions (Addendum)
    Supplemental Discharge Instructions for  Pacemaker/Defibrillator Patients  Tomorrow, 11/21/20, send in a device transmission  Activity No heavy lifting or vigorous activity with your left/right arm for 6 to 8 weeks.  Do not raise your left/right arm above your head for one week.  Gradually raise your affected arm as drawn below.               11/25/20                  11/26/20                    11/27/20                  11/28/20                __  NO DRIVING for  1 week   ; you may begin driving on  04/09/84   .  WOUND CARE Keep the wound area clean and dry.  Do not get this area wet , no showers for 24 hours; you may shower on  11/21/20 evening   . Tomorrow, 11/21/20, remove the arm sling Dr. Graciela Husbands used DERMABOND to close your wound.  DO NOT peel this off.  Do not rub/scrub the area, pat dry. No bandage is needed on the site.  DO  NOT apply any creams, oils, or ointments to the wound area. If you notice any drainage or discharge from the wound, any swelling or bruising at the site, or you develop a fever > 101? F after you are discharged home, call the office at once.  Special Instructions You are still able to use cellular telephones; use the ear opposite the side where you have your pacemaker/defibrillator.  Avoid carrying your cellular phone near your device. When traveling through airports, show security personnel your identification card to avoid being screened in the metal detectors.  Ask the security personnel to use the hand wand. Avoid arc welding equipment, MRI testing (magnetic resonance imaging), TENS units (transcutaneous nerve stimulators).  Call the office for questions about other devices. Avoid electrical appliances that are in poor condition or are not properly grounded. Microwave ovens are safe to be near or to operate.

## 2020-11-20 NOTE — H&P (Signed)
Patient Care Team: Grayce Sessions, NP as PCP - General (Internal Medicine) Meriam Sprague, MD as PCP - Cardiology (Cardiology)   HPI  Robert [name] is a 55 y.o. male with prior stoke for which he received an event recorder which demonstrated  sinus pauses.    Complaints of exertional dyspnea >>CTA>>Cath and RCA PCI, relationship with SA artery not clear but repeat monitoring demonstrated ongoing sinus pauses, mostly during, but not exclusively during sleeping hours.  Underwent sleep study, surprisingly neg  Hence have elected to proceed with pacing   Has seen neurosurgery regarding "pinched nerve" in neck affecting his Left Hand     Records and Results Reviewed   Past Medical History:  Diagnosis Date   Coronary artery disease    stent RCA 8/22   Hypertension    Sinus arrest    mostly nocturnal    Past Surgical History:  Procedure Laterality Date   CORONARY STENT INTERVENTION N/A 10/10/2020   Procedure: CORONARY STENT INTERVENTION;  Surgeon: Lennette Bihari, MD;  Location: [facility];  Service: Cardiovascular;  Laterality: N/A;   LEFT HEART CATH AND CORONARY ANGIOGRAPHY N/A 10/10/2020   Procedure: LEFT HEART CATH AND CORONARY ANGIOGRAPHY;  Surgeon: Lennette Bihari, MD;  Location: [facility];  Service: Cardiovascular;  Laterality: N/A;   TONSILLECTOMY AND ADENOIDECTOMY      No current facility-administered medications for this encounter.    No Known Allergies    Social History   Tobacco Use   Smoking status: Every Day    Packs/day: 0.50    Types: Cigarettes   Smokeless tobacco: Never  Substance Use Topics   Alcohol use: Yes    Alcohol/week: 0.0 standard drinks   Drug use: No     Family History  Problem Relation Age of Onset   Hypertension Mother    Hypertension Father    Hypertension Brother      Current Meds  Medication Sig   amLODipine (NORVASC) 10 MG tablet Take 1 tablet (10 mg total) by mouth daily.   aspirin EC 81  MG tablet Take 1 tablet (81 mg total) by mouth daily. Swallow whole.   atorvastatin (LIPITOR) 80 MG tablet Take 1 tablet (80 mg total) by mouth daily. (Patient taking differently: Take 40 mg by mouth daily.)   clopidogrel (PLAVIX) 75 MG tablet Take 1 tablet (75 mg total) by mouth daily. (Patient taking differently: Take 75 mg by mouth in the morning and at bedtime.)   gabapentin (NEURONTIN) 800 MG tablet TAKE 1 TABLET (800 MG TOTAL) BY MOUTH 2 (TWO) TIMES DAILY. (Patient taking differently: Take 800 mg by mouth See admin instructions. Take 1 tablet (800 mg) by mouth scheduled every morning & may take an additional dose in the evening if needed for nerve pain.)   lisinopril-hydrochlorothiazide (ZESTORETIC) 20-12.5 MG tablet Take 1 tablet by mouth daily.   nitroGLYCERIN (NITROSTAT) 0.4 MG SL tablet Place 1 tablet (0.4 mg total) under the tongue every 5 (five) minutes as needed for chest pain.   OVER THE COUNTER MEDICATION Take 1,600 mg by mouth every Monday, Tuesday, Wednesday, Thursday, and Friday. LongJack Arbutus Ped     Review of Systems negative except from HPI and PMH  Physical Exam BP 123/81   Pulse 69   Temp 98.7 F (37.1 C) (Oral)   Resp 16   Ht 6' (1.829 m)   Wt 79.8 kg   SpO2 100%   BMI 23.87 kg/m  Well  developed and well nourished in no acute distress HENT normal E scleral and icterus clear Neck Supple JVP flat; carotids brisk and full Clear to ausculation Regular rate and rhythm, no murmurs gallops or rub Soft with active bowel sounds No clubbing cyanosis  Edema Alert and oriented, grossly normal motor and sensory function Skin Warm and Dry    Assessment and  Plan Sinus pauses mostly nocturnal   Coronary artery disease status post RCA stenting question location vis--vis SA artery   Stroke   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  The patient understands agrees and is willing to proceed.

## 2020-11-20 NOTE — Progress Notes (Signed)
Pt ambulated without difficulty or bleeding.   Discharged home with his mom who will drive and stay with pt x 24 hrs.

## 2020-11-21 ENCOUNTER — Encounter (HOSPITAL_COMMUNITY): Payer: Self-pay | Admitting: Internal Medicine

## 2020-11-21 ENCOUNTER — Telehealth: Payer: Self-pay

## 2020-11-21 MED FILL — Lidocaine HCl Local Inj 1%: INTRAMUSCULAR | Qty: 60 | Status: AC

## 2020-11-21 NOTE — Telephone Encounter (Signed)
Follow-up after same day discharge: Implant date: 11/20/20 MD: Sherryl Manges, MD Device: St. Jude Assurity MRI dual chamber PPM Location: Left Chest   Wound check visit: 11/30/20 with A. Tillery, PA 90 day MD follow-up: 02/21/21  Remote Transmission received: No. Awaiting arrival of remote monitor 11/22/20  Dressing removed: yes. Dermabond intact  Successful telephone encounter to patient to follow up post PPM implant 11/20/20. Patient states he is doing well. Denies swelling or s/s of infection. Mild bruising noted. Mild pain controlled with tylenol. Reinforced discharge instructions including arm movement, driving, and showering. Patient is focused on disability forms and need for permanent disability. Informed patient that unfortunately this RN did not complete for file disability papers for patients. He is encouraged to contact his disability attorney or DSS for additional guidance and forms needed. Wound check and 91 day follow up appointment confirmed.

## 2020-11-21 NOTE — Telephone Encounter (Signed)
Dr Graciela Husbands has spoken with pt re: monitor results.

## 2020-11-24 ENCOUNTER — Other Ambulatory Visit: Payer: Self-pay | Admitting: *Deleted

## 2020-11-24 DIAGNOSIS — I639 Cerebral infarction, unspecified: Secondary | ICD-10-CM

## 2020-11-30 ENCOUNTER — Encounter: Payer: Medicaid Other | Admitting: Student

## 2020-12-01 ENCOUNTER — Other Ambulatory Visit: Payer: Self-pay

## 2020-12-01 ENCOUNTER — Other Ambulatory Visit: Payer: Self-pay | Admitting: Physician Assistant

## 2020-12-01 ENCOUNTER — Encounter: Payer: Medicaid Other | Admitting: Student

## 2020-12-04 ENCOUNTER — Other Ambulatory Visit: Payer: Self-pay

## 2020-12-04 ENCOUNTER — Encounter: Payer: Self-pay | Admitting: Surgery

## 2020-12-04 ENCOUNTER — Ambulatory Visit (HOSPITAL_COMMUNITY)
Admission: RE | Admit: 2020-12-04 | Discharge: 2020-12-04 | Disposition: A | Discharge status: Home / Self Care | Payer: Medicaid Other | Source: Ambulatory Visit | Attending: Surgery | Admitting: Surgery

## 2020-12-04 ENCOUNTER — Ambulatory Visit: Payer: Medicaid Other | Admitting: Surgery

## 2020-12-04 VITALS — BP 117/76 | HR 72 | Temp 98.2°F | Resp 20 | Ht 72.0 in | Wt 178.0 lb

## 2020-12-04 DIAGNOSIS — I6523 Occlusion and stenosis of bilateral carotid arteries: Secondary | ICD-10-CM | POA: Diagnosis not present

## 2020-12-04 DIAGNOSIS — I639 Cerebral infarction, unspecified: Secondary | ICD-10-CM | POA: Diagnosis not present
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
## 2020-12-05 ENCOUNTER — Ambulatory Visit (INDEPENDENT_AMBULATORY_CARE_PROVIDER_SITE_OTHER): Payer: Medicaid Other | Admitting: Student

## 2020-12-05 DIAGNOSIS — I495 Sick sinus syndrome: Secondary | ICD-10-CM

## 2020-12-05 DIAGNOSIS — I455 Other specified heart block: Secondary | ICD-10-CM

## 2020-12-05 LAB — CUP PACEART INCLINIC DEVICE CHECK
Battery Remaining Longevity: 117 mo
Battery Voltage: 3.11 V
Brady Statistic RA Percent Paced: 22 %
Brady Statistic RV Percent Paced: 5.5 %
Date Time Interrogation Session: 20220927124403
Implantable Lead Implant Date: 20220912
Implantable Lead Implant Date: 20220912
Implantable Lead Location: 753859
Implantable Lead Location: 753860
Implantable Lead Model: 3830
Implantable Pulse Generator Implant Date: 20220912
Lead Channel Impedance Value: 525 Ohm
Lead Channel Impedance Value: 575 Ohm
Lead Channel Pacing Threshold Amplitude: 0.5 V
Lead Channel Pacing Threshold Amplitude: 0.5 V
Lead Channel Pacing Threshold Amplitude: 1 V
Lead Channel Pacing Threshold Amplitude: 1 V
Lead Channel Pacing Threshold Pulse Width: 0.5 ms
Lead Channel Pacing Threshold Pulse Width: 0.5 ms
Lead Channel Pacing Threshold Pulse Width: 0.5 ms
Lead Channel Pacing Threshold Pulse Width: 0.5 ms
Lead Channel Sensing Intrinsic Amplitude: 3.8 mV
Lead Channel Sensing Intrinsic Amplitude: 8.5 mV
Lead Channel Setting Pacing Amplitude: 3.5 V
Lead Channel Setting Pacing Amplitude: 3.5 V
Lead Channel Setting Pacing Pulse Width: 0.5 ms
Lead Channel Setting Sensing Sensitivity: 0.7 mV
Pulse Gen Model: 2272
Pulse Gen Serial Number: 3961405

## 2020-12-05 NOTE — Progress Notes (Signed)
Wound check appointment. Dermabond mostly removed. Wound without redness or edema. Incision edges approximated, wound well healed. Normal device function. Thresholds, sensing, and impedances consistent with implant measurements. Device programmed at 3.5V/auto capture programmed on for extra safety margin until 3 month visit. Histogram distribution appropriate for patient and level of activity. No mode switches or high ventricular rates noted. Patient educated about wound care, arm mobility, lifting restrictions. ROV in 3 months with Dr. Graciela Husbands.

## 2020-12-06 ENCOUNTER — Other Ambulatory Visit: Payer: Self-pay

## 2020-12-06 ENCOUNTER — Other Ambulatory Visit (INDEPENDENT_AMBULATORY_CARE_PROVIDER_SITE_OTHER): Payer: Self-pay | Admitting: Primary Care

## 2020-12-06 ENCOUNTER — Ambulatory Visit: Payer: Medicaid Other | Admitting: Orthopaedic Surgery

## 2020-12-06 NOTE — Telephone Encounter (Signed)
Requested medication (s) are due for refill today:Yes  Requested medication (s) are on the active medication list: Yes  Last refill:  10/11/20  Future visit scheduled: Yes  Notes to clinic:  Unable to refill per protocol, last refill by another provider.      Requested Prescriptions  Pending Prescriptions Disp Refills   amLODipine (NORVASC) 10 MG tablet 30 tablet 2    Sig: TAKE 1 TABLET BY MOUTH DAILY.     Cardiovascular:  Calcium Channel Blockers Passed - 12/06/2020  2:38 PM      Passed - Last BP in normal range    BP Readings from Last 1 Encounters:  12/04/20 117/76          Passed - Valid encounter within last 6 months    Recent Outpatient Visits           1 month ago Colon cancer screening   [facility] Grayce Sessions, NP   3 months ago Cervical radiculopathy   [facility] Grayce Sessions, NP   5 years ago Essential hypertension   [facility], Dema Severin, PA-C   6 years ago Essential hypertension   [facility], Dema Severin, PA-C   6 years ago Essential hypertension   [facility], Gwenlyn Found, MD       Future Appointments             In 1 month Alben Spittle, Evern Bio, PA-C [facility] [employer]

## 2020-12-06 NOTE — Telephone Encounter (Signed)
Sent to PCP ?

## 2020-12-06 NOTE — Telephone Encounter (Signed)
Per cardiology notes patient not taking medication

## 2020-12-07 ENCOUNTER — Other Ambulatory Visit: Payer: Self-pay

## 2020-12-07 ENCOUNTER — Ambulatory Visit: Payer: Self-pay | Admitting: Interventional Cardiology

## 2020-12-08 ENCOUNTER — Telehealth (INDEPENDENT_AMBULATORY_CARE_PROVIDER_SITE_OTHER): Payer: Self-pay

## 2020-12-08 NOTE — Telephone Encounter (Signed)
Pt is calling back to following up with [place] Please advise

## 2020-12-08 NOTE — Telephone Encounter (Signed)
Copied from CRM [id number]. Topic: General - Other >> Dec 07, 2020  2:15 PM Pawlus, Maxine Glenn A wrote: Reason for CRM: Pt called in asking if he could seen by a different vascular surgeon named Reather Converse, pt was also asking about completing paperwork and had a few follow up questions.  Attempted to reach patient to follow up on his concerns. Mailbox is full. Maryjean Morn, CMA

## 2020-12-08 NOTE — Telephone Encounter (Signed)
Patient requesting referral to vascular for a second opinion. Does not believe what current vascular provider is saying. Would like to be referred to Robert [name] in [place].

## 2020-12-16 ENCOUNTER — Encounter (INDEPENDENT_AMBULATORY_CARE_PROVIDER_SITE_OTHER): Payer: Self-pay | Admitting: Primary Care

## 2020-12-16 DIAGNOSIS — I2510 Atherosclerotic heart disease of native coronary artery without angina pectoris: Secondary | ICD-10-CM

## 2020-12-26 ENCOUNTER — Ambulatory Visit (INDEPENDENT_AMBULATORY_CARE_PROVIDER_SITE_OTHER): Payer: Medicaid Other | Admitting: Primary Care

## 2020-12-26 ENCOUNTER — Other Ambulatory Visit: Payer: Self-pay

## 2020-12-26 ENCOUNTER — Ambulatory Visit (INDEPENDENT_AMBULATORY_CARE_PROVIDER_SITE_OTHER): Payer: Medicaid Other | Admitting: Orthopaedic Surgery

## 2020-12-26 ENCOUNTER — Encounter (INDEPENDENT_AMBULATORY_CARE_PROVIDER_SITE_OTHER): Payer: Self-pay | Admitting: Primary Care

## 2020-12-26 ENCOUNTER — Encounter: Payer: Self-pay | Admitting: Orthopaedic Surgery

## 2020-12-26 VITALS — BP 136/89 | HR 60 | Ht 73.0 in | Wt 178.0 lb

## 2020-12-26 VITALS — BP 143/92 | HR 60 | Temp 97.5°F | Ht 72.0 in | Wt 186.4 lb

## 2020-12-26 DIAGNOSIS — I10 Essential (primary) hypertension: Secondary | ICD-10-CM | POA: Diagnosis not present

## 2020-12-26 DIAGNOSIS — Z1211 Encounter for screening for malignant neoplasm of colon: Secondary | ICD-10-CM

## 2020-12-26 DIAGNOSIS — M4302 Spondylolysis, cervical region: Secondary | ICD-10-CM

## 2020-12-26 MED ORDER — LISINOPRIL-HYDROCHLOROTHIAZIDE 20-12.5 MG PO TABS
1.0000 | ORAL_TABLET | Freq: Every day | ORAL | 1 refills | Status: DC
  Filled 2020-12-26: qty 90, 90d supply, fill #0

## 2020-12-26 NOTE — Progress Notes (Signed)
Office Visit Note   Patient: Robert [name]           Date of Birth: 1965/08/07           MRN: [id number] Visit Date: 12/26/2020              Requested by: Grayce Sessions, NP [address] [place],  [place] [address] PCP: Grayce Sessions, NP   Assessment & Plan: Visit Diagnoses:  1. 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  He already has a neurosurgeon is following him.  Once his cardiology clears him he can return if he is not happy with outlined treatment plan.  I discussed with him that the appropriate plan has been discussed with him with the neurosurgeon that he relates to me.  He can follow-up here as needed.  Follow-Up Instructions: No follow-ups on file.   Orders:  No orders of the defined types were placed in this encounter.  No orders of the defined types were placed in this encounter.     Procedures: No procedures performed   Clinical Data: No additional findings.   Subjective: Chief Complaint  Patient presents with   Neck - Pain    HPI 55 year old male with problems running with his left hand.  He has known cervical spondylosis with cord stenosis with some myelomalacia changes at C6-7.  He had right internal carotid artery thrombosis and states he now has a second opinion since Dr. Myra Gianotti told me nothing surgically needed to be done.  He had seen someone at [facility] 10+ years ago who told him he needed to have his neck surgery.  Patient also had an MRI 2021 done at [facility] with similar findings to his current scan from 07/10/2020. Patient denies any myelopathic symptoms.  Has some problems with coordination use of his left hand.  After stroke and to have cardiac stent and then later a pacemaker.  He saw neurosurgeon here in town about surgery on his neck but states he was told he has to delay surgery until its been long enough time after his stent placement the cardiologist will allow  him to proceed. Review of Systems   Objective: Vital Signs: BP 136/89   Pulse 60   Ht 6\' 1"  (1.854 m)   Wt 178 lb (80.7 kg)   BMI 23.48 kg/m   Physical Exam Constitutional:      Appearance: He is well-developed.  HENT:     Head: Normocephalic and atraumatic.     Right Ear: External ear normal.     Left Ear: External ear normal.  Eyes:     Pupils: Pupils are equal, round, and reactive to light.  Neck:     Thyroid: No thyromegaly.     Trachea: No tracheal deviation.  Cardiovascular:     Rate and Rhythm: Normal rate.  Pulmonary:     Effort: Pulmonary effort is normal.     Breath sounds: No wheezing.  Abdominal:     General: Bowel sounds are normal.     Palpations: Abdomen is soft.  Musculoskeletal:     Cervical back: Neck supple.  Skin:    General: Skin is warm and dry.     Capillary Refill: Capillary refill takes less than 2 seconds.  Neurological:     Mental Status: He is alert and oriented to person, place, and time.  Psychiatric:        Behavior: Behavior normal.  Thought Content: Thought content normal.        Judgment: Judgment normal.    Ortho Exam patient has normal gait without wide-based gait.  Turns rotates without problems.  Normal strength no atrophy in lower extremities.  Slight decreased biceps triceps grip on the left.  Some problems with rapid alternating movement left hand no problems with the right hand.  Reflexes 1+ and symmetrical.  Negative Babinski.  Specialty Comments:  No specialty comments available.  Imaging: CLINICAL DATA:  55 year old male with left side numbness. 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 No tPA/thrombectomy.   Neck pain.   EXAM: MRI CERVICAL SPINE WITHOUT CONTRAST   TECHNIQUE: Multiplanar, multisequence MR imaging of the cervical spine was performed. No intravenous contrast was administered.   COMPARISON:  [facility] cervical spine CT 03/07/2020.   Brain MRI today reported separately.  CTA yesterday.   FINDINGS: Alignment: Mild straightening of cervical lordosis compared to the December CT. No significant spondylolisthesis.   Vertebrae: Faint degenerative appearing endplate marrow edema at C5-C6. Background heterogeneous bone marrow signal, although more normal marrow signal at the skull base.   Cord: Suboptimal cord detail on axial images today due to motion. 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 Above and below that level no definite cord signal abnormality.   Posterior Fossa, vertebral arteries, paraspinal tissues: Cervicomedullary junction is within normal limits. See brain findings today reported separately. Abnormal right ICA flow void in the neck just distal to the bifurcation. Other major arterial flow voids in the neck appear preserved and this is concordant with the CTA yesterday.   Otherwise negative visible neck soft tissues, lung apices.   Disc levels:   C2-C3: Mild circumferential disc bulge and endplate spurring. No spinal stenosis. Mild to moderate C3 foraminal stenosis.   C3-C4: Disc space loss with circumferential disc bulge and endplate spurring eccentric to the left. Mild spinal stenosis. No definite cord mass effect. Moderate to severe left greater than right C4 foraminal stenosis.   C4-C5: Circumferential disc bulging with mild endplate spurring. No stenosis.   C5-C6: More lobulated disc osteophyte complex eccentric to the left and best seen on series 15, image 20. Spinal stenosis with mild left hemi cord mass effect. Moderate left and severe right C6 foraminal stenosis.   C6-C7: Circumferential disc osteophyte complex with broad-based posterior component. Mild spinal stenosis. Mild if any cord  mass effect. Severe bilateral C7 foraminal stenosis.   C7-T1: Mild facet hypertrophy greater on the right. Mild left and mild to moderate right C8 foraminal stenosis.   Visible upper thoracic spine degeneration but no upper thoracic spinal stenosis.   IMPRESSION: 1. Multilevel cervical disc and endplate degeneration. Multilevel mild spinal stenosis with mild cord mass effect at 1 or more levels. 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 2. No other spinal cord signal abnormality identified. Moderate to severe neural foraminal stenosis also at the bilateral C4, C6, and right C8 nerve levels. 3. 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   Preliminary report of the above discussed by telephone with Dr. Erick Blinks on 07/10/2020 at 0544 hours.     Electronically Signed   By: Odessa Fleming M.D.   On: 07/10/2020 05:53  PMFS History: Patient Active Problem List   Diagnosis Date Noted   CAD (coronary artery disease) 10/11/2020   CAD in native artery 10/10/2020   Exertional dyspnea    Abnormal cardiac CT angiography    Tobacco use 07/10/2020   Stroke (cerebrum) (HCC) 07/10/2020   Acute CVA (cerebrovascular accident) (HCC) 07/09/2020   Cervical radiculopathy 04/26/2020   Left upper extremity numbness 04/26/2020   Cervical spondylolysis 04/26/2020   HTN (hypertension) 11/04/2014   Past Medical History:  Diagnosis Date   Coronary artery disease    stent RCA 8/22   Hypertension    Sinus arrest    mostly nocturnal    Family History  Problem Relation Age of Onset   Hypertension Mother    Hypertension Father    Hypertension Brother     Past Surgical History:  Procedure Laterality Date   CORONARY STENT INTERVENTION N/A 10/10/2020   Procedure: CORONARY STENT INTERVENTION;  Surgeon: Lennette Bihari, MD;  [facility]: [facility];  Service: Cardiovascular;  Laterality: N/A;   LEFT HEART CATH  AND CORONARY ANGIOGRAPHY N/A 10/10/2020   Procedure: LEFT HEART CATH AND CORONARY ANGIOGRAPHY;  Surgeon: Lennette Bihari, MD;  Location: [facility];  Service: Cardiovascular;  Laterality: N/A;   PACEMAKER IMPLANT N/A 11/20/2020   Procedure: PACEMAKER IMPLANT;  Surgeon: Duke Salvia, MD;  Location: [facility] IN[facility] CV LAB;  Service: Cardiovascular;  Laterality: N/A;   TONSILLECTOMY AND ADENOIDECTOMY     Social History   Occupational History   Occupation: truck Hospital doctor    Comment: [employer]  Tobacco Use   Smoking status: Every Day    Packs/day: 1.00    Types: Cigarettes   Smokeless tobacco: Never  Vaping Use   Vaping Use: Never used  Substance and Sexual Activity   Alcohol use: Yes    Alcohol/week: 0.0 standard drinks   Drug use: No   Sexual activity: Not Currently

## 2020-12-26 NOTE — Progress Notes (Signed)
[facility]  HPI Robert [name] is a 55 y.o.male who presents for initially a follow-up after seen the clinical social worker for anxiety/depression and insomnia.  However he missed that appointment and will need to be rescheduled.  His blood pressure is elevated but he did not take his blood pressure medication today generally he takes his medication every day. Denies shortness of breath, headaches, chest pain or lower extremity edema.  Dr. Ophelia Charter orthopedist saw him earlier today to evaluate cervical spondylosis and treatment plan.  He is constantly in pain from his neck to his left hand.  Past Medical History:  Diagnosis Date   Coronary artery disease    stent RCA 8/22   Hypertension    Sinus arrest    mostly nocturnal     No Known Allergies    Current Outpatient Medications on File Prior to Visit  Medication Sig Dispense Refill   amLODipine (NORVASC) 10 MG tablet Take 1 tablet (10 mg total) by mouth daily. (Patient not taking: Reported on 12/04/2020)     aspirin EC 81 MG tablet Take 1 tablet (81 mg total) by mouth daily. Swallow whole. 30 tablet 1   atorvastatin (LIPITOR) 80 MG tablet Take 1 tablet (80 mg total) by mouth daily. (Patient taking differently: Take 40 mg by mouth daily.) 90 tablet 3   clopidogrel (PLAVIX) 75 MG tablet Take 1 tablet (75 mg total) by mouth daily. (Patient taking differently: Take 75 mg by mouth in the morning and at bedtime.) 90 tablet 3   gabapentin (NEURONTIN) 800 MG tablet TAKE 1 TABLET (800 MG TOTAL) BY MOUTH 2 (TWO) TIMES DAILY. (Patient taking differently: Take 800 mg by mouth See admin instructions. Take 1 tablet (800 mg) by mouth scheduled every morning & may take an additional dose in the evening if needed for nerve pain.) 60 tablet 1   lisinopril-hydrochlorothiazide (ZESTORETIC) 20-12.5 MG tablet Take 1 tablet by mouth daily. 30 tablet 2   nitroGLYCERIN (NITROSTAT) 0.4 MG SL tablet Place 1 tablet (0.4 mg total) under the tongue  every 5 (five) minutes as needed for chest pain. (Patient not taking: Reported on 12/04/2020) 25 tablet 1   OVER THE COUNTER MEDICATION Take 1-2 tablets by mouth daily. Testosterone booster supplement     OVER THE COUNTER MEDICATION Take 1,600 mg by mouth every Monday, Tuesday, Wednesday, Thursday, and Friday. LongJack Arbutus Ped     No current facility-administered medications on file prior to visit.    ROS: all negative except above.   Physical Exam: BP (!) 143/92 (BP Location: Right Arm, Patient Position: Sitting, Cuff Size: Normal)   Pulse 60   Temp (!) 97.5 F (36.4 C) (Temporal)   Ht 6' (1.829 m)   Wt 186 lb 6.4 oz (84.6 kg)   SpO2 96%   BMI 25.28 kg/m   General Appearance: Well nourished, in no apparent distress. Eyes: PERRLA, EOMs, conjunctiva no swelling or erythema Sinuses: No Frontal/maxillary tenderness ENT/Mouth: Ext aud canals clear, TMs without erythema, bulging. No erythema, swelling, or exudate on post pharynx.  Tonsils not swollen or erythematous. Hearing normal.  Neck: Supple, thyroid normal.  Respiratory: Respiratory effort normal, BS equal bilaterally without rales, rhonchi, wheezing or stridor.  Cardio: RRR with no MRGs. Brisk peripheral pulses without edema.  Abdomen: Soft, + BS.  Non tender, no guarding, rebound, hernias, masses. Lymphatics: Non tender without lymphadenopathy.  Musculoskeletal: Full ROM, 5/5 strength, normal gait.  Skin: Warm, dry without rashes, lesions, ecchymosis.  Neuro: Cranial nerves  intact. Normal muscle tone, no cerebellar symptoms. Sensation intact.  Psych: Awake and oriented X 3, normal affect, Insight and Judgment appropriate.    Robert [name] was seen today for follow-up.  Diagnoses and all orders for this visit:  Colon cancer screening -     Cologuard  Primary hypertension Counseled on blood pressure goal of less than 130/80, low-sodium, DASH diet, medication compliance, 150 minutes of moderate intensity exercise per  week. Discussed medication compliance, adverse effects.  -     lisinopril-hydrochlorothiazide (ZESTORETIC) 20-12.5 MG tablet; Take 1 tablet by mouth daily.   Grayce Sessions, NP 3:37 PM

## 2021-01-02 ENCOUNTER — Other Ambulatory Visit: Payer: Self-pay

## 2021-01-03 DIAGNOSIS — I6521 Occlusion and stenosis of right carotid artery: Secondary | ICD-10-CM | POA: Diagnosis not present

## 2021-01-24 ENCOUNTER — Ambulatory Visit: Payer: Self-pay | Admitting: Adult Health

## 2021-01-24 ENCOUNTER — Encounter: Payer: Self-pay | Admitting: Adult Health

## 2021-01-24 NOTE — Progress Notes (Deleted)
[facility] [address] [place]. [place] [address] [phone]       HOSPITAL FOLLOW UP NOTE  Robert [name] Date of Birth:  Jan 20, 1966 Medical Record Number:  [id number]   Reason for Referral:  hospital stroke follow up    SUBJECTIVE:   CHIEF COMPLAINT:  No chief complaint on file.   HPI:   Update 01/24/2021 JM: Returns for 57-month stroke follow-up  Overall stable from stroke standpoint -denies new stroke/TIA symptoms Continues to experience left hand weakness ***.  Per patient, further surgical procedure with neurosurgery on hold for cervical spondylosis in setting of prior stroke and recently undergoing pacemaker implant - can consider proceeding once cleared by cardiology.  Seen by Dr. Ophelia Charter (ortho) who also agreed with recommendation  Compliant on aspirin and atorvastatin -denies side effects Blood pressure today ***  Eval by Dr. Myra Gianotti 9/26 - no intervention of chronic right carotid occlusion recommended. Eval by Dr. Christell Constant ([facility]) for second opinion (as he was was told nothing surgically could be done) who essentially agreed with Dr. Myra Gianotti - per note, patient requested Dr. Christell Constant take over care - plans to repeat carotid duplex around 06/2021 S/p pacemaker implant 11/20/2020 -routinely followed by cardiology     History provided for reference purposes only Initial visit 09/20/2020 JM: Robert [name] is being seen for hospital follow-up unaccompanied.  Greatest concern today is in regards to continued left neck/shoulder pain as well as left arm weakness and left facial numbness.  He also reports fluctuation of LLE weakness especially towards the end of the day or when he is fatigued.  He is also been experiencing mild dyspnea on exertion and occasional blurred vision which was present prior to his recent stroke.  He has not participated in therapies and is hesitant to do so as he feels left-sided pain worsens with activity or motion.  He has not yet  been seen by neurosurgery since discharge.  Denies new stroke/TIA symptoms.  Reports compliance on aspirin 81 mg daily without associated side effects.  He was compliant on atorvastatin but recently ran out of prescription -denies any side effects while taking.  Blood pressure today satisfactory 126/75.  Routinely monitors at home and reports low blood pressure when he takes both amlodipine and lisinopril-hydrochlorothiazide but blood pressure elevated if he takes only one of the medications.  He does have an appointment with cardiology on 7/20.  He plans on completing HST 7/22 for suspected underlying sleep apnea espcially with cardiac monitor showing several pauses mostly occurring while he was sleeping - has appt with Dr. Graciela Husbands on 8/8 to discuss further.  Continued tobacco use.  No further concerns at this time.  Stroke admission 07/09/2020 [name] is a 55 y.o. male with history of hypertension, hyperlipidemia, smoker admitted on 07/09/2020 for left arm hand weakness over the past 6 months, worsening for [redacted] weeks along with left facial numbness.  Personally reviewed hospitalization pertinent progress notes, lab work and imaging summary provided.  Evaluated by Dr. 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  Recommended 30-day cardiac event monitor outpatient to rule out A. fib.  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  Recommend initiating aspirin 81 mg daily  for secondary stroke prevention -DAPT not recommended given small IVH. MR C-spine C6-7 myelopathy with cervical stenosis likely cause of neck pain and shoulder pain as well as 58-month duration of left  arm and hand weakness.  EF 55 to 60%.  No no intervention recommended further right ICA occlusion as evidenced on CTA.  Elevated homocystine level likely due to tobacco use.  Hypercoagulable work-up negative.  HTN stable on high end will monitor BP normotensive range.  LDL 118 -initiate atorvastatin 40 mg daily.  A1c 5.4.  Current tobacco use with smoking cessation counseling provided.  Evaluated by therapies and recommended outpatient PT.       ROS:   14 system review of systems performed and negative with exception of those listed in HPI  PMH:  Past Medical History:  Diagnosis Date   Coronary artery disease    stent RCA 8/22   Hypertension    Sinus arrest    mostly nocturnal    PSH:  Past Surgical History:  Procedure Laterality Date   CORONARY STENT INTERVENTION N/A 10/10/2020   Procedure: CORONARY STENT INTERVENTION;  Surgeon: Lennette Bihari, MD;  Location: [facility] INVASIVE CV LAB;  Service: Cardiovascular;  Laterality: N/A;   LEFT HEART CATH AND CORONARY ANGIOGRAPHY N/A 10/10/2020   Procedure: LEFT HEART CATH AND CORONARY ANGIOGRAPHY;  Surgeon: Lennette Bihari, MD;  Location: [facility] INVASIVE CV LAB;  Service: Cardiovascular;  Laterality: N/A;   PACEMAKER IMPLANT N/A 11/20/2020   Procedure: PACEMAKER IMPLANT;  Surgeon: Duke Salvia, MD;  Location: [facility] INVASIVE CV LAB;  Service: Cardiovascular;  Laterality: N/A;   TONSILLECTOMY AND ADENOIDECTOMY      Social History:  Social History   Socioeconomic History   Marital status: Single    Spouse name: Not on file   Number of children: 2   Years of education: HS diploma and some college   Highest education level: Not on file  Occupational History   Occupation: truck driver    Comment: [employer]  Tobacco Use   Smoking status: Every Day    Packs/day: 1.00    Types: Cigarettes   Smokeless tobacco: Never  Vaping Use   Vaping Use: Never used  Substance and Sexual Activity   Alcohol use: Yes    Alcohol/week: 0.0  standard drinks   Drug use: No   Sexual activity: Not Currently  Other Topics Concern   Not on file  Social History Narrative   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 - [employer]   Single   Social Determinants of Health   Financial Resource Strain: Not on file  Food Insecurity: Not on file  Transportation Needs: Not on file  Physical Activity: Not on file  Stress: Not on file  Social Connections: Not on file  Intimate Partner Violence: Not on file    Family History:  Family History  Problem Relation Age of Onset   Hypertension Mother    Hypertension Father    Hypertension Brother     Medications:   Current Outpatient Medications on File Prior to Visit  Medication Sig Dispense Refill   amLODipine (NORVASC) 10 MG tablet Take 1 tablet (10 mg total) by mouth daily.     aspirin EC 81 MG tablet Take 1 tablet (81 mg total) by mouth daily. Swallow whole. 30 tablet 1   atorvastatin (LIPITOR) 80 MG tablet Take 1 tablet (80 mg total) by mouth daily. (Patient taking differently: Take 40 mg by mouth daily.) 90 tablet 3  clopidogrel (PLAVIX) 75 MG tablet Take 1 tablet (75 mg total) by mouth daily. (Patient taking differently: Take 75 mg by mouth in the morning and at bedtime.) 90 tablet 3   gabapentin (NEURONTIN) 800 MG tablet TAKE 1 TABLET (800 MG TOTAL) BY MOUTH 2 (TWO) TIMES DAILY. (Patient taking differently: Take 800 mg by mouth See admin instructions. Take 1 tablet (800 mg) by mouth scheduled every morning & may take an additional dose in the evening if needed for nerve pain.) 60 tablet 1   lisinopril-hydrochlorothiazide (ZESTORETIC) 20-12.5 MG tablet Take 1 tablet by mouth daily. 90 tablet 1   nitroGLYCERIN (NITROSTAT) 0.4 MG SL tablet Place 1 tablet (0.4 mg total) under the tongue every 5 (five) minutes as needed for chest pain. 25 tablet 1   OVER THE COUNTER MEDICATION Take 1-2 tablets by mouth daily. Testosterone booster supplement     OVER THE COUNTER MEDICATION Take 1,600  mg by mouth every Monday, Tuesday, Wednesday, Thursday, and Friday. LongJack Arbutus Ped     No current facility-administered medications on file prior to visit.    Allergies:  No Known Allergies    OBJECTIVE:  Physical Exam  There were no vitals filed for this visit.  There is no height or weight on file to calculate BMI.   General: well developed, well nourished, pleasant middle-aged African-American male, seated, in no evident distress Head: head normocephalic and atraumatic.   Neck: supple with no carotid or supraclavicular bruits Cardiovascular: regular rate and rhythm, no murmurs Musculoskeletal: no deformity Skin:  no rash/petichiae Vascular:  Normal pulses all extremities   Neurologic Exam Mental Status: Awake and fully alert.  Fluent speech and language.  Oriented to place and time. Recent and remote memory intact. Attention span, concentration and fund of knowledge appropriate. Mood and affect appropriate.  Cranial Nerves: Pupils equal, briskly reactive to light. Extraocular movements full without nystagmus. Visual fields full to confrontation. Hearing intact. Facial sensation slightly decreased left cheek. Face, tongue, palate moves normally and symmetrically.  Motor: Normal strength, bulk and tone in right upper and lower extremity as well as left lower extremity.  Left upper extremity decreased strength bicep and tricep and weak grip strength with increased tone Sensory.: intact to touch , pinprick , position and vibratory sensation Coordination: Rapid alternating movements normal in all extremities except left hand. Finger-to-nose performed accurately RUE and heel-to-shin performed accurately bilaterally. Gait and Station: Arises from chair without difficulty. Stance is normal. Gait demonstrates normal stride length and balance without use of assistive device. Tandem walk and heel toe moderate difficulty.  Reflexes: 1+ and symmetric. Toes downgoing.         ASSESSMENT: Heinz Eckert is a 55 y.o. 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  Vascular risk factors include HTN, HLD, right carotid occlusion, incidental finding of small IVH, and cervical myelopathy.      PLAN:  R MCA stroke, cerebellar stroke:  Residual deficit: LUE weakness although weakness possibly in setting of cervical myelopathy and subjective fluctuating LLE weakness.  Declines interest in therapy for residual stroke deficits as he is concerned therapy/exercise will worsen LUE pain 30-day cardiac event monitor showed frequent nocturnal pauses -has evaluation with EP Dr. Graciela Husbands 8/8 for further evaluation Continue aspirin 81 mg daily  and atorvastatin 40 mg daily for  secondary stroke prevention.   Discussed secondary stroke prevention measures and importance of close PCP follow up for aggressive stroke risk factor management  Cervical spondylosis: Followed by neurosurgery.  MR C-spine C6-7 myelopathy with cervical stenosis likely cause of neck and shoulder pain as well as 34-month duration of left arm and hand weakness.  Previously seen by neurosurgery outpatient but no further interventions completed due to lack of insurance -he is currently Medicaid pending.  Advised him to contact prior neurosurgeon office for follow-up Right carotid occlusion: Carotid duplex 11/2020 right ICA total occlusion and left ICA 1 to 39% stenosis.  Followed by Dr. Myra Gianotti [facility] with plans on f/u around 11/2021. Discussed ongoing use of aspirin and statin as well as importance of tobacco cessation.  HTN: BP goal <130/90.  Stable on current regimen per PCP HLD: LDL goal <70. Recent LDL 118.  Continue atorvastatin 40 mg daily -refill provided per request  further refills by PCP Tobacco use: Discussed importance of complete tobacco cessation and educated on risk factors with continued use.  Patient verbalized understanding and will follow up with PCP when ready to quit if further assistance is needed.    Follow up in 4 months or call earlier if needed   CC:  [facility] provider: Dr. Pearlean Brownie PCP: Grayce Sessions, NP    I spent 56 minutes of face-to-face and non-face-to-face time with patient.  This included previsit chart review, lab review, study review, order entry, electronic health record documentation, and patient education and discussion regarding prior stroke including etiology, secondary stroke prevention measures and aggressive stroke risk factor management, residual deficits and likely further recovery, cervical myelopathy and follow-up with neurosurgery and answered all questions to patient satisfaction   Ihor Austin, AGNP-BC  [facility] [address] [place], [place] [address]  Phone [phone] Fax [phone] Note: This document was prepared with digital dictation and possible smart phrase technology. Any transcriptional errors that result from this process are unintentional.

## 2021-01-25 ENCOUNTER — Institutional Professional Consult (permissible substitution) (INDEPENDENT_AMBULATORY_CARE_PROVIDER_SITE_OTHER): Payer: Medicaid Other | Admitting: Clinical

## 2021-01-26 ENCOUNTER — Other Ambulatory Visit (INDEPENDENT_AMBULATORY_CARE_PROVIDER_SITE_OTHER): Payer: Self-pay | Admitting: Primary Care

## 2021-01-26 DIAGNOSIS — I10 Essential (primary) hypertension: Secondary | ICD-10-CM

## 2021-01-26 MED ORDER — LISINOPRIL-HYDROCHLOROTHIAZIDE 20-12.5 MG PO TABS: 1.0000 | ORAL_TABLET | Freq: Every day | ORAL | 1 refills | Status: DC

## 2021-01-26 NOTE — Telephone Encounter (Signed)
Please send short supply until refill come in and Patient needs medications sent to [facility] in [place] from now on Medication Refill - Medication:lisinopril-hydrochlorothiazide (ZESTORETIC) 20-12.5 MG   Has the patient contacted their pharmacy? yes (Agent: If no, request that the patient contact the pharmacy for the refill. If patient does not wish to contact the pharmacy document the reason why and proceed with request.) (Agent: If yes, when and what did the pharmacy advise?)contact pcp  Preferred Pharmacy (with phone number or street name):[address], [place], [place] [address]  [phone] Has the patient been seen for an appointment in the last year OR does the patient have an upcoming appointment? yes  Agent: Please be advised that RX refills may take up to 3 business days. We ask that you follow-up with your pharmacy.

## 2021-01-26 NOTE — Telephone Encounter (Signed)
Requested Prescriptions  Pending Prescriptions Disp Refills  . lisinopril-hydrochlorothiazide (ZESTORETIC) 20-12.5 MG tablet 90 tablet 1    Sig: Take 1 tablet by mouth daily.     Cardiovascular:  ACEI + Diuretic Combos Failed - 01/26/2021 12:30 PM      Failed - Last BP in normal range    BP Readings from Last 1 Encounters:  12/26/20 (!) 143/92         Passed - Na in normal range and within 180 days    Sodium  Date Value Ref Range Status  11/20/2020 142 135 - 145 mmol/L Final  09/27/2020 141 134 - 144 mmol/L Final         Passed - K in normal range and within 180 days    Potassium  Date Value Ref Range Status  11/20/2020 4.0 3.5 - 5.1 mmol/L Final         Passed - Cr in normal range and within 180 days    Creat  Date Value Ref Range Status  10/31/2015 1.09 0.70 - 1.33 mg/dL Final    Comment:      For patients > or = 57 years of age: The upper reference limit for Creatinine is approximately 13% higher for people identified as African-American.      Creatinine, Ser  Date Value Ref Range Status  11/20/2020 1.10 0.61 - 1.24 mg/dL Final         Passed - Ca in normal range and within 180 days    Calcium  Date Value Ref Range Status  10/11/2020 8.9 8.9 - 10.3 mg/dL Final   Calcium, Ion  Date Value Ref Range Status  11/20/2020 1.19 1.15 - 1.40 mmol/L Final         Passed - Patient is not pregnant      Passed - Valid encounter within last 6 months    Recent Outpatient Visits          1 month ago Colon cancer screening   [facility] Grayce Sessions, NP   2 months ago Colon cancer screening   [facility] Grayce Sessions, NP   5 months ago Cervical radiculopathy   [facility] Grayce Sessions, NP   5 years ago Essential hypertension   [facility], Dema Severin, PA-C   6 years ago Essential hypertension   [facility], Dema Severin, PA-C      Future  Appointments            In 4 days Beatrice Lecher, PA-C [facility] [employer]

## 2021-01-30 ENCOUNTER — Ambulatory Visit: Payer: Medicaid Other | Admitting: Physician Assistant

## 2021-01-30 DIAGNOSIS — Z959 Presence of cardiac and vascular implant and graft, unspecified: Secondary | ICD-10-CM | POA: Insufficient documentation

## 2021-01-30 DIAGNOSIS — I495 Sick sinus syndrome: Secondary | ICD-10-CM | POA: Insufficient documentation

## 2021-01-30 DIAGNOSIS — E785 Hyperlipidemia, unspecified: Secondary | ICD-10-CM | POA: Insufficient documentation

## 2021-01-30 NOTE — Progress Notes (Deleted)
Cardiology Office Note:    Date:  01/30/2021   ID:  Woodward Ku, DOB 18-Jul-1965, MRN [id number]  PCP:  Grayce Sessions, NP   [facility] Providers Cardiologist:  Meriam Sprague, MD { Click to update primary MD,subspecialty MD or APP then REFRESH:1}  *** Referring MD: Grayce Sessions, NP   Chief Complaint:  No chief complaint on file. {Click here for Visit Info    :1}   Patient Profile:   Robert [name] is a 55 y.o. male with:  Coronary artery disease  S/p DES to pRCA in 8/22 Sinus pauses Sleep study neg S/p Pacemaker Hx of R MCA and cerebellar infarct in 5/22 [place] w RICA 100 CTO C/b petechial hemorrhage and interventricular hemorrhage >> DAPT DC'd >> eventual ASA Rx Hypercoag w/u neg Carotid artery Dz [place] 9/22: R 100; L 1-39 Hypertension  Hyperlipidemia  +Cigs Cervical DDD  History of Present Illness: Robert [name] was last seen by Dr. Shari Prows in 7/22 for post hospital f/u from his CVA.  His event monitor did not show atrial fibrillation but he did have significant sinus pauses.  He saw EP.  Sleep study was neg.  Coronary CTA demonstrated significant RCA stenosis and he underwent cardiac catheterization in 8/22.  This demonstrated 80% pRCA stenosis which was tx with DES.  He had a f/u monitor post PCI that continued to demonstrate sinus pauses, up to 10 sec.  He underwent dual chamber PPM with Dr. Graciela Husbands 9/22.  He returns for f/u.  ***  ASSESSMENT & PLAN:   No problem-specific Assessment & Plan notes found for this encounter.        {Are you ordering a CV Procedure (e.g. stress test, cath, DCCV, TEE, etc)?   Press F2        :[id number]}   Dispo:  No follow-ups on file.    Prior CV studies: Carotid US 12/04/20 RICA 100; LICA 1-39  Event monitor 10/2020 Min HR 26, Longest pause 10.2 sec  Cardiac catheterization 10/10/20 LAD mid 20 LCx mid 5 RCA prox 80, dist 20 PCI: 3 x 15 mm Onyx Frontier DES to [employer]  Echocardiogram 07/10/20 EF 55-60, no RWMA, Gr 1 DD,  normal RVSF, RVSP 27.2, neg bubble study, trivial MR  {Select studies to display:26339}    Past Medical History:  Diagnosis Date   Coronary artery disease    stent RCA 8/22   Hypertension    Sinus arrest    mostly nocturnal   Current Medications: No outpatient medications have been marked as taking for the 01/30/21 encounter (Appointment) with Tereso Newcomer T, PA-C.    Allergies:   Patient has no known allergies.   Social History   Tobacco Use   Smoking status: Every Day    Packs/day: 1.00    Types: Cigarettes   Smokeless tobacco: Never  Vaping Use   Vaping Use: Never used  Substance Use Topics   Alcohol use: Yes    Alcohol/week: 0.0 standard drinks   Drug use: No    Family Hx: The patient's family history includes Hypertension in his brother, father, and mother.  ROS   EKGs/Labs/Other Test Reviewed:    EKG:  EKG is *** ordered today.  The ekg ordered today demonstrates ***  Recent Labs: 07/09/2020: ALT 16 07/10/2020: TSH 0.552 10/11/2020: Platelets 213 11/20/2020: BUN 20; Creatinine, Ser 1.10; Hemoglobin 12.9; Potassium 4.0; Sodium 142   Recent Lipid Panel Lab Results  Component Value Date/Time   CHOL 185 07/10/2020 12:19 AM  TRIG 120 07/10/2020 12:19 AM   HDL 43 07/10/2020 12:19 AM   LDLCALC 118 (H) 07/10/2020 12:19 AM     Risk Assessment/Calculations:   {Does this patient have ATRIAL FIBRILLATION?:[id number]}      Physical Exam:    VS:  There were no vitals taken for this visit.    Wt Readings from Last 3 Encounters:  12/26/20 186 lb 6.4 oz (84.6 kg)  12/26/20 178 lb (80.7 kg)  12/04/20 178 lb (80.7 kg)    Physical Exam ***    Medication Adjustments/Labs and Tests Ordered: Current medicines are reviewed at length with the patient today.  Concerns regarding medicines are outlined above.  Tests Ordered: No orders of the defined types were placed in this encounter.  Medication Changes: No orders of the defined types were placed in this  encounter.  Signed, Tereso Newcomer, PA-C  01/30/2021 7:41 AM    [facility] [address] [place], [place], [place]  [address] Phone: [phone]; Fax: [phone]

## 2021-02-15 ENCOUNTER — Institutional Professional Consult (permissible substitution) (INDEPENDENT_AMBULATORY_CARE_PROVIDER_SITE_OTHER): Payer: Medicaid Other | Admitting: Clinical

## 2021-02-19 ENCOUNTER — Other Ambulatory Visit: Payer: Self-pay

## 2021-02-19 ENCOUNTER — Ambulatory Visit (INDEPENDENT_AMBULATORY_CARE_PROVIDER_SITE_OTHER): Payer: Medicaid Other

## 2021-02-19 ENCOUNTER — Other Ambulatory Visit (HOSPITAL_COMMUNITY): Payer: Self-pay

## 2021-02-19 ENCOUNTER — Encounter: Payer: Self-pay | Admitting: Internal Medicine

## 2021-02-19 ENCOUNTER — Ambulatory Visit (INDEPENDENT_AMBULATORY_CARE_PROVIDER_SITE_OTHER): Payer: Medicaid Other | Admitting: Internal Medicine

## 2021-02-19 ENCOUNTER — Encounter: Payer: Self-pay | Admitting: *Deleted

## 2021-02-19 DIAGNOSIS — I495 Sick sinus syndrome: Secondary | ICD-10-CM | POA: Diagnosis not present

## 2021-02-19 NOTE — Progress Notes (Signed)
Patient Care Team: Grayce Sessions, NP as PCP - General (Internal Medicine) Meriam Sprague, MD as PCP - Cardiology (Cardiology)   HPI  Robert [name] is a 55 y.o. male seen in follow-up for bradycardia arrhythmias detected on event recorder placed following a stroke 5/22.  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  There is concern about cardioembolic source hence the monitor.  Noted to have pauses of 3-9 seconds.  None of these was associated with symptoms.  Electrograms demonstrated PR prolongation and was my impression that they were sleep-related.  He has had no syncope or presyncope.  Patient patient however had another episode at 1:24 PM (CDT) and the comments from the company were that he was asymptomatic while driving.  Complaints of  dyspnea on exertion prompted a CTA which was abnormal for RCA lesion and he underwent catheterization demonstrated an 80% mid RCA lesion for which he underwent stenting.  Dyspnea has improved post stenting.    Unfortunately, he continued to have pauses mostly but not solely during sleeping hours.  Sleep study was negative.  And he underwent pacing 9/22  Date Cr K Hgb LDL  9/22 1.1 4.0 12.3   118 (5/22)           The patient denies chest pain, shortness of breath, nocturnal dyspnea, orthopnea or peripheral edema.  There have been no palpitations, lightheadedness or syncope.      Records and Results Reviewed   Past Medical History:  Diagnosis Date   Coronary artery disease    stent RCA 8/22   Hypertension    Sinus arrest    mostly nocturnal    Past Surgical History:  Procedure Laterality Date   CORONARY STENT INTERVENTION N/A 10/10/2020   Procedure: CORONARY STENT INTERVENTION;  Surgeon: Lennette Bihari, MD;  Location: [facility] INVASIVE CV LAB;  Service: Cardiovascular;  Laterality: N/A;   LEFT HEART CATH AND CORONARY ANGIOGRAPHY N/A 10/10/2020   Procedure: LEFT HEART  CATH AND CORONARY ANGIOGRAPHY;  Surgeon: Lennette Bihari, MD;  Location: [facility] INVASIVE CV LAB;  Service: Cardiovascular;  Laterality: N/A;   PACEMAKER IMPLANT N/A 11/20/2020   Procedure: PACEMAKER IMPLANT;  Surgeon: Duke Salvia, MD;  Location: [facility] INVASIVE CV LAB;  Service: Cardiovascular;  Laterality: N/A;   TONSILLECTOMY AND ADENOIDECTOMY      Current Meds  Medication Sig   aspirin EC 81 MG tablet Take 1 tablet (81 mg total) by mouth daily. Swallow whole.   clopidogrel (PLAVIX) 75 MG tablet Take 1 tablet (75 mg total) by mouth daily. (Patient taking differently: Take 75 mg by mouth in the morning and at bedtime.)   gabapentin (NEURONTIN) 800 MG tablet TAKE 1 TABLET (800 MG TOTAL) BY MOUTH 2 (TWO) TIMES DAILY. (Patient taking differently: Take 800 mg by mouth See admin instructions. Take 1 tablet (800 mg) by mouth scheduled every morning & may take an additional dose in the evening if needed for nerve pain.)   lisinopril-hydrochlorothiazide (ZESTORETIC) 20-12.5 MG tablet Take 1 tablet by mouth daily.   nitroGLYCERIN (NITROSTAT) 0.4 MG SL tablet Place 1 tablet (0.4 mg total) under the tongue every 5 (five) minutes as needed for chest pain.   OVER THE COUNTER MEDICATION Take 1,600 mg by mouth every Monday, Tuesday, Wednesday, Thursday, and Friday. LongJack Tongkat Ali    No Known Allergies    Review of Systems negative except from HPI and PMH  Physical Exam BP 120/62  Pulse 81   Ht 6' (1.829 m)   Wt 182 lb 9.6 oz (82.8 kg)   SpO2 98%   BMI 24.77 kg/m  Well developed and well nourished in no acute distress HENT normal Neck supple with JVP-flat Clear Device pocket well healed; without hematoma or erythema.  There is no tethering  Regular rate and rhythm, no  gallop No  murmur Abd-soft with active BS No Clubbing cyanosis  edema Skin-warm and dry A & Oriented  Grossly normal sensory and motor function  ECG sinus at 81 Intervals 19/15/41 Right axis at 166 Right bundle branch  block  CrCl cannot be calculated (Patient's most recent lab result is older than the maximum 21 days allowed.).   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  6% pacing in the ventricle 30% pacing in the atrium  No ischemia.  Continue aspirin--and atorvastatin 80.  Last LDL was out of range of 118.  We will recheck    No interval syncope.

## 2021-02-20 LAB — CUP PACEART REMOTE DEVICE CHECK
Battery Remaining Longevity: 88 mo
Battery Remaining Percentage: 95.5 %
Battery Voltage: 3.02 V
Brady Statistic AP VP Percent: 3.1 %
Brady Statistic AP VS Percent: 32 %
Brady Statistic AS VP Percent: 3.1 %
Brady Statistic AS VS Percent: 61 %
Brady Statistic RA Percent Paced: 33 %
Brady Statistic RV Percent Paced: 6.2 %
Date Time Interrogation Session: 20221212044841
Implantable Lead Implant Date: 20220912
Implantable Lead Implant Date: 20220912
Implantable Lead Location: 753859
Implantable Lead Location: 753860
Implantable Lead Model: 3830
Implantable Pulse Generator Implant Date: 20220912
Lead Channel Impedance Value: 550 Ohm
Lead Channel Impedance Value: 580 Ohm
Lead Channel Pacing Threshold Amplitude: 0.5 V
Lead Channel Pacing Threshold Amplitude: 1 V
Lead Channel Pacing Threshold Pulse Width: 0.5 ms
Lead Channel Pacing Threshold Pulse Width: 0.5 ms
Lead Channel Sensing Intrinsic Amplitude: 3.3 mV
Lead Channel Sensing Intrinsic Amplitude: 9.5 mV
Lead Channel Setting Pacing Amplitude: 3.5 V
Lead Channel Setting Pacing Amplitude: 3.5 V
Lead Channel Setting Pacing Pulse Width: 0.5 ms
Lead Channel Setting Sensing Sensitivity: 0.7 mV
Pulse Gen Model: 2272
Pulse Gen Serial Number: 3961405

## 2021-02-21 ENCOUNTER — Encounter: Payer: Medicaid Other | Admitting: Internal Medicine

## 2021-02-27 NOTE — Progress Notes (Signed)
Remote pacemaker transmission.   

## 2021-03-13 ENCOUNTER — Other Ambulatory Visit: Payer: Medicaid Other

## 2021-03-14 ENCOUNTER — Other Ambulatory Visit: Payer: Medicaid Other

## 2021-04-05 ENCOUNTER — Ambulatory Visit (INDEPENDENT_AMBULATORY_CARE_PROVIDER_SITE_OTHER): Payer: Medicaid Other | Admitting: Clinical

## 2021-04-05 ENCOUNTER — Other Ambulatory Visit: Payer: Self-pay

## 2021-04-05 DIAGNOSIS — F331 Major depressive disorder, recurrent, moderate: Secondary | ICD-10-CM

## 2021-04-11 ENCOUNTER — Telehealth: Payer: Self-pay | Admitting: Clinical

## 2021-04-11 NOTE — Telephone Encounter (Signed)
I attempted to call pt to complete referral for psychiatry and therapy. No answer, unable to leave vm.

## 2021-04-11 NOTE — BH Specialist Note (Signed)
Integrated Behavioral Health Initial In-Person Visit  MRN: [id number] Name: Robert [name]  Number of Integrated Behavioral Health Clinician visits:: 1/6 Session Start time: 8:45am  Session End time: 9:45am Total time: 60 minutes  Types of Service: Individual psychotherapy  Interpretor:No. Interpretor Name and Language: N/A   Warm Hand Off Completed.        Subjective: Robert [name] is a 56 y.o. male accompanied by  self Patient was referred by Gwinda Passe, NP for depression and anxiety. Patient reports the following symptoms/concerns: Reports feeling depressed, decreased interest in activities, trouble sleeping, decreased energy, poor appetite, self-esteem disturbances, trouble concentrating, fidgeting, anxiousness, excessive worrying, trouble relaxing, restlessness, and irritability. Reports that he has experienced physical health problems. Reports that he also lost his employment due to getting arrested. Reports that he is currently on probation but worries about going to jail. Duration of problem: 1 year ; Severity of problem: moderate  Objective: Mood: Anxious and Depressed and Affect: Appropriate Risk of harm to self or others: Suicidal ideation Denies plan/intent.   Life Context: Family and Social: Pt receives support from family as he is currently staying with family.  School/Work: Pt is unemployed. Pt recently was approved for disability and is waiting to receive income. Self-Care: Endorses marijuana use.  Life Changes: Pt has experienced physical health problems and lost employment due to being arrested. Pt is awaiting a hearing for current charge.   Patient and/or Family's Strengths/Protective Factors: Concrete supports in place (healthy food, safe environments, etc.)  Goals Addressed: Patient will: Reduce symptoms of: anxiety and depression Increase knowledge and/or ability of: coping skills  Demonstrate ability to: Increase healthy adjustment to current life  circumstances  Progress towards Goals: Ongoing  Interventions: Interventions utilized: Mindfulness or Management consultant, CBT Cognitive Behavioral Therapy, and Supportive Counseling  Standardized Assessments completed: C-SSRS Short, GAD-7, and PHQ 9 GAD 7 : Generalized Anxiety Score 04/05/2021 12/26/2020 10/30/2020 08/17/2020  Nervous, Anxious, on Edge 3 2 1 3   Control/stop worrying 3 2 2 3   Worry too much - different things 3 2 2  -  Trouble relaxing 3 2 2 2   Restless 3 2 2 2   Easily annoyed or irritable 3 1 1 2   Afraid - awful might happen 2 1 2 3   Total GAD 7 Score 20 12 12  -  Anxiety Difficulty - Very difficult Very difficult Very difficult     Depression screen [facility] 2/9 04/05/2021 12/26/2020 10/30/2020 08/17/2020 10/31/2015  Decreased Interest 3 2 2 3  0  Down, Depressed, Hopeless 3 2 2 3  0  PHQ - 2 Score 6 4 4 6  0  Altered sleeping 3 3 2 3  -  Tired, decreased energy 3 3 2 3  -  Change in appetite 3 2 2 3  -  Feeling bad or failure about yourself  3 3 2 1  -  Trouble concentrating 3 2 2 3  -  Moving slowly or fidgety/restless 3 1 1 2  -  Suicidal thoughts 1 0 0 2 -  PHQ-9 Score 25 18 15 23  -  Difficult doing work/chores - Very difficult Very difficult Very difficult -    Flowsheet Row Integrated Behavioral Health from 04/05/2021 in [facility] Admission (Discharged) from 11/20/2020 in [facility] Admission (Discharged) from 10/10/2020 in [place] 10/17/2020 Progressive [facility]  C-SSRS RISK CATEGORY Low Risk No Risk No Risk       Patient and/or Family Response: Pt receptive to tx. Pt receptive to psychoeducation provided on anxiety and depression.  Pt receptive to cognitive restructuring to decrease unhelpful thoughts. Pt receptive to deep breathing exercises, grounding techniques, and referral for psychiatry.  Patient Centered Plan: Patient is on the following Treatment Plan(s):  Depression  Assessment: Endorses passive SI. Denies  plan/intent. Endorses protective factor as family. Safety plan completed and pt provided with crisis resources. Pt encouraged to utilize provided crisis resources if SI arises with plan, means, and intent. Patient currently experiencing depression and anxiety. Pt appears to be overwhelmed.   Patient may benefit from psychiatry and therapy. LCSWA provided psychoeducation on depression and anxiety. LCSWA utilized cognitive restructuring to decrease negative and unhelpful thoughts. LCSWA encouraged pt to utilize deep breathing exercises and grounding techniques. LCSWA will refer pt for psychiatry and therapy. LCSWA will fu with pt.  Plan: Follow up with behavioral health clinician on : 04/26/21 Behavioral recommendations: Utilize provided crisis resources if SI arises with plan, means, and intent. Utilize deep breathing exercises and grounding techniques.  Referral(s): Integrated Art gallery manager (In Clinic), Psychiatrist, and Counselor "From scale of 1-10, how likely are you to follow plan?": 10  Robert Labrador C Buck Mcaffee, LCSW

## 2021-04-13 ENCOUNTER — Other Ambulatory Visit: Payer: Self-pay | Admitting: Cardiovascular Disease

## 2021-04-18 ENCOUNTER — Encounter: Payer: Self-pay | Admitting: Adult Health

## 2021-04-19 ENCOUNTER — Other Ambulatory Visit (INDEPENDENT_AMBULATORY_CARE_PROVIDER_SITE_OTHER): Payer: Self-pay | Admitting: Primary Care

## 2021-04-19 DIAGNOSIS — F4323 Adjustment disorder with mixed anxiety and depressed mood: Secondary | ICD-10-CM

## 2021-04-19 DIAGNOSIS — F331 Major depressive disorder, recurrent, moderate: Secondary | ICD-10-CM

## 2021-04-23 ENCOUNTER — Telehealth (INDEPENDENT_AMBULATORY_CARE_PROVIDER_SITE_OTHER): Payer: Self-pay | Admitting: Primary Care

## 2021-04-23 NOTE — Telephone Encounter (Signed)
Pt states he is still waiting to her from psychologist.  He said [facility] cannot write prescriptions, and he needs someone that can prescribe him medication. Please advise

## 2021-04-25 NOTE — Telephone Encounter (Signed)
Left message on voicemail.  Advised that [facility] office should call w/I 2 weeks for referral placed on 04/19/2021. Advise to call office if he has not received call.

## 2021-04-26 ENCOUNTER — Telehealth: Payer: Self-pay | Admitting: *Deleted

## 2021-04-26 ENCOUNTER — Ambulatory Visit (INDEPENDENT_AMBULATORY_CARE_PROVIDER_SITE_OTHER): Payer: Medicaid Other | Admitting: Clinical

## 2021-04-26 NOTE — Telephone Encounter (Signed)
Patient sent my chart asking for a call re: he needs surgery. Called him, he states he saw neurosurgery, needs surgery for slipped disk. Neurosurgeon told him they  will wait a year because he had a stent put in by Dr Graciela Husbands. Dr Graciela Husbands told patient he would send note to neurosurgeon. I advised him this is not Dr Odessa Fleming office. He stated Shanda Bumps told him to see a neurosurgeon; looked at last office note and informed him she advised he follow up with neurosurgery, gave him #  in her office note. I asked him to schedule a follow up; he stated he needs surgery first. I requested he call when ready to schedule FU. Patient verbalized understanding, appreciation.

## 2021-04-27 NOTE — Telephone Encounter (Signed)
Pt returned call. I informed pt of information needed for referral. LCSW referred pt to [facility]. He stated he does not want an appt with LCSW currently due to needing medication.

## 2021-05-02 ENCOUNTER — Telehealth: Payer: Self-pay | Admitting: Primary Care

## 2021-05-03 ENCOUNTER — Ambulatory Visit (INDEPENDENT_AMBULATORY_CARE_PROVIDER_SITE_OTHER): Payer: Self-pay

## 2021-05-03 NOTE — Telephone Encounter (Signed)
Please advise 

## 2021-05-03 NOTE — Telephone Encounter (Signed)
Patient called in demanding to get a call back from Robert [name] states that he did not have time to make an appointment to come see her but that it is important that she call him by Monday because he is expected to be arrested on 05/08/21 and will not be available to talk after that day. Can be reached at Ph# [phone]    Pt. Reports he has a court date in [place] next week and may be incarcerated. States "I still haven't seen a psychiatrist for my anxiety, depression and memory loss." "I need medication for this." Requests to speak with pcp. Please advise pt.

## 2021-05-08 NOTE — Telephone Encounter (Signed)
I referred this pt to [facility] so he should have received a call but I will check on this.

## 2021-05-09 ENCOUNTER — Telehealth: Payer: Self-pay | Admitting: Clinical

## 2021-05-09 NOTE — Telephone Encounter (Signed)
I contacted pt and he verified that he received a text message from [name] for an appt but he is currently on his way to [place] for court and if he does not go to prison he will call them and schedule a psychiatry appt. ?

## 2021-05-25 DIAGNOSIS — F411 Generalized anxiety disorder: Secondary | ICD-10-CM | POA: Diagnosis not present

## 2021-05-25 DIAGNOSIS — F3132 Bipolar disorder, current episode depressed, moderate: Secondary | ICD-10-CM | POA: Diagnosis not present

## 2021-06-04 ENCOUNTER — Ambulatory Visit (INDEPENDENT_AMBULATORY_CARE_PROVIDER_SITE_OTHER): Payer: Medicaid Other

## 2021-06-04 DIAGNOSIS — I495 Sick sinus syndrome: Secondary | ICD-10-CM | POA: Diagnosis not present

## 2021-06-05 LAB — CUP PACEART REMOTE DEVICE CHECK
Battery Remaining Longevity: 117 mo
Battery Remaining Percentage: 95.5 %
Battery Voltage: 3.02 V
Brady Statistic AP VP Percent: 1 %
Brady Statistic AP VS Percent: 30 %
Brady Statistic AS VP Percent: 1 %
Brady Statistic AS VS Percent: 69 %
Brady Statistic RA Percent Paced: 27 %
Brady Statistic RV Percent Paced: 1 %
Date Time Interrogation Session: 20230327001342
Implantable Lead Implant Date: 20220912
Implantable Lead Implant Date: 20220912
Implantable Lead Location: 753859
Implantable Lead Location: 753860
Implantable Lead Model: 3830
Implantable Pulse Generator Implant Date: 20220912
Lead Channel Impedance Value: 480 Ohm
Lead Channel Impedance Value: 490 Ohm
Lead Channel Pacing Threshold Amplitude: 0.5 V
Lead Channel Pacing Threshold Amplitude: 0.5 V
Lead Channel Pacing Threshold Pulse Width: 0.5 ms
Lead Channel Pacing Threshold Pulse Width: 0.5 ms
Lead Channel Sensing Intrinsic Amplitude: 2.1 mV
Lead Channel Sensing Intrinsic Amplitude: 6.5 mV
Lead Channel Setting Pacing Amplitude: 2 V
Lead Channel Setting Pacing Amplitude: 2.5 V
Lead Channel Setting Pacing Pulse Width: 0.5 ms
Lead Channel Setting Sensing Sensitivity: 0.7 mV
Pulse Gen Model: 2272
Pulse Gen Serial Number: 3961405

## 2021-06-11 DIAGNOSIS — F3132 Bipolar disorder, current episode depressed, moderate: Secondary | ICD-10-CM | POA: Diagnosis not present

## 2021-06-13 NOTE — Progress Notes (Signed)
Remote pacemaker transmission.   

## 2021-06-27 DIAGNOSIS — M5412 Radiculopathy, cervical region: Secondary | ICD-10-CM | POA: Diagnosis not present

## 2021-07-16 ENCOUNTER — Encounter: Payer: Self-pay | Admitting: Cardiology

## 2021-07-16 ENCOUNTER — Encounter (INDEPENDENT_AMBULATORY_CARE_PROVIDER_SITE_OTHER): Payer: Self-pay | Admitting: Primary Care

## 2021-07-29 ENCOUNTER — Other Ambulatory Visit (INDEPENDENT_AMBULATORY_CARE_PROVIDER_SITE_OTHER): Payer: Self-pay | Admitting: Primary Care

## 2021-07-29 DIAGNOSIS — I10 Essential (primary) hypertension: Secondary | ICD-10-CM

## 2022-02-26 ENCOUNTER — Other Ambulatory Visit: Payer: Self-pay

## 2023-08-26 ENCOUNTER — Ambulatory Visit (INDEPENDENT_AMBULATORY_CARE_PROVIDER_SITE_OTHER): Payer: Self-pay | Admitting: Primary Care

## 2023-08-26 ENCOUNTER — Encounter (INDEPENDENT_AMBULATORY_CARE_PROVIDER_SITE_OTHER): Payer: Self-pay | Admitting: Primary Care

## 2023-08-26 VITALS — BP 131/88 | HR 67 | Resp 16 | Ht 72.0 in | Wt 200.2 lb

## 2023-08-26 DIAGNOSIS — I10 Essential (primary) hypertension: Secondary | ICD-10-CM

## 2023-08-26 DIAGNOSIS — I2510 Atherosclerotic heart disease of native coronary artery without angina pectoris: Secondary | ICD-10-CM | POA: Diagnosis not present

## 2023-08-26 DIAGNOSIS — M5412 Radiculopathy, cervical region: Secondary | ICD-10-CM | POA: Diagnosis not present

## 2023-08-26 DIAGNOSIS — Z7689 Persons encountering health services in other specified circumstances: Secondary | ICD-10-CM

## 2023-08-26 DIAGNOSIS — Z720 Tobacco use: Secondary | ICD-10-CM

## 2023-08-26 DIAGNOSIS — E785 Hyperlipidemia, unspecified: Secondary | ICD-10-CM | POA: Diagnosis not present

## 2023-08-26 DIAGNOSIS — I495 Sick sinus syndrome: Secondary | ICD-10-CM

## 2023-08-26 DIAGNOSIS — F17210 Nicotine dependence, cigarettes, uncomplicated: Secondary | ICD-10-CM

## 2023-08-26 DIAGNOSIS — R972 Elevated prostate specific antigen [PSA]: Secondary | ICD-10-CM

## 2023-08-26 NOTE — Progress Notes (Unsigned)
 New Patient Office Visit  Subjective     Robert [name] is a 58 y.o obese man. Recently out of prison and has not been taking his bp medication. Father had prostate cancer and has not been taking his medication.   CC:     HPI   Current Outpatient Medications on File Prior to Visit  Medication Sig Dispense Refill   amLODipine  (NORVASC ) 10 MG tablet Take 1 tablet (10 mg total) by mouth daily. (Patient not taking: Reported on 02/19/2021)     aspirin  EC 81 MG tablet Take 1 tablet (81 mg total) by mouth daily. Swallow whole. 30 tablet 1   atorvastatin  (LIPITOR ) 80 MG tablet Take 1 tablet (80 mg total) by mouth daily. 90 tablet 3   clopidogrel  (PLAVIX ) 75 MG tablet Take 1 tablet (75 mg total) by mouth daily. (Patient taking differently: Take 75 mg by mouth in the morning and at bedtime.) 90 tablet 3   gabapentin  (NEURONTIN ) 800 MG tablet Take 1 tablet (800 mg total) by mouth See admin instructions. Take 1 tablet (800 mg) by mouth scheduled every morning & may take an additional dose in the evening if needed for nerve pain. 180 tablet 1   lisinopril -hydrochlorothiazide  (ZESTORETIC ) 20-12.5 MG tablet Take 1 tablet by mouth once daily 30 tablet 0   nitroGLYCERIN  (NITROSTAT ) 0.4 MG SL tablet Place 1 tablet (0.4 mg total) under the tongue every 5 (five) minutes as needed for chest pain. 25 tablet 1   OVER THE COUNTER MEDICATION Take 1-2 tablets by mouth daily. Testosterone booster supplement (Patient not taking: Reported on 02/19/2021)     OVER THE COUNTER MEDICATION Take 1,600 mg by mouth every Monday, Tuesday, Wednesday, Thursday, and Friday. LongJack Ella Gun     No current facility-administered medications on file prior to visit.     No Known Allergies  Past Medical History:  Diagnosis Date   Coronary artery disease    stent RCA 8/22   Hypertension    Sinus arrest    mostly nocturnal     Past Surgical History:  Procedure Laterality Date   CORONARY STENT INTERVENTION N/A  10/10/2020   Procedure: CORONARY STENT INTERVENTION;  Surgeon: Millicent Ally, MD;  Location: [facility] INVASIVE CV LAB;  Service: Cardiovascular;  Laterality: N/A;   LEFT HEART CATH AND CORONARY ANGIOGRAPHY N/A 10/10/2020   Procedure: LEFT HEART CATH AND CORONARY ANGIOGRAPHY;  Surgeon: Millicent Ally, MD;  Location: [facility] INVASIVE CV LAB;  Service: Cardiovascular;  Laterality: N/A;   PACEMAKER IMPLANT N/A 11/20/2020   Procedure: PACEMAKER IMPLANT;  Surgeon: Verona Goodwill, MD;  Location: [facility] INVASIVE CV LAB;  Service: Cardiovascular;  Laterality: N/A;   TONSILLECTOMY AND ADENOIDECTOMY       Family History  Problem Relation Age of Onset   Hypertension Mother    Hypertension Father    Hypertension Brother     Social History   Socioeconomic History   Marital status: Single    Spouse name: Not on file   Number of children: 2   Years of education: HS diploma and some college   Highest education level: Not on file  Occupational History   Occupation: truck driver    Comment: [employer]  Tobacco Use   Smoking status: Every Day    Current packs/day: 1.00    Types: Cigarettes   Smokeless tobacco: Never  Vaping Use   Vaping status: Never Used  Substance and Sexual Activity   Alcohol use: Yes    Alcohol/week:  0.0 standard drinks of alcohol   Drug use: No   Sexual activity: Not Currently  Other Topics Concern   Not on file  Social History Narrative   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 - [employer]   Single   Social Drivers of Health   Financial Resource Strain: Not on file  Food Insecurity: No Food Insecurity (01/03/2021)   Received from [facility]   Hunger Vital Sign    Within the past 12 months, you worried that your food would run out before you got the money to buy more.: Never true    Within the past 12 months, the food you bought just didn't last and you didn't have money to get more.: Never true  Transportation Needs: Not on file  Physical Activity: Not  on file  Stress: Not on file  Social Connections: Unknown (07/13/2021)   Received from [facility]   Social Network    Social Network: Not on file  Intimate Partner Violence: Unknown (06/12/2021)   Received from [facility]   HITS    Physically Hurt: Not on file    Insult or Talk Down To: Not on file    Threaten Physical Harm: Not on file    Scream or Curse: Not on file       Health Maintenance  Topic Date Due   Medicare Annual Wellness Visit  Never done   Colon Cancer Screening  Never done   Screening for Lung Cancer  10/04/2021   COVID-19 Vaccine (1 - 2024-25 season) Never done   Zoster (Shingles) Vaccine (1 of 2) 11/26/2023*   Pneumococcal Vaccination (1 of 2 - PCV) 08/25/2024*   Flu Shot  10/10/2023   DTaP/Tdap/Td vaccine (2 - Td or Tdap) 10/30/2025   Hepatitis C Screening  Completed   HIV Screening  Completed   HPV Vaccine  Aged Out   Meningitis B Vaccine  Aged Out  *Topic was postponed. The date shown is not the original due date.    Objective    BP 131/88 (BP Location: Right Arm, Patient Position: Sitting, Cuff Size: Normal)   Pulse 67   Resp 16   Ht 6' (1.829 m)   Wt 200 lb 3.2 oz (90.8 kg)   SpO2 98%   BMI 27.15 kg/m   Physical Exam Vitals reviewed.  Constitutional:      Appearance: Normal appearance.  HENT:     Head: Normocephalic.     Right Ear: Tympanic membrane and external ear normal.     Left Ear: Tympanic membrane and external ear normal.     Nose: Nose normal.   Eyes:     Extraocular Movements: Extraocular movements intact.     Pupils: Pupils are equal, round, and reactive to light.    Cardiovascular:     Rate and Rhythm: Normal rate and regular rhythm.  Pulmonary:     Effort: Pulmonary effort is normal.     Breath sounds: Normal breath sounds.  Abdominal:     General: Bowel sounds are normal. There is distension.     Palpations: Abdomen is soft.   Musculoskeletal:        General: Normal range of motion.   Skin:    General:  Skin is warm and dry.   Neurological:     Mental Status: He is oriented to person, place, and time.   Psychiatric:        Mood and Affect: Mood normal.        Behavior: Behavior  normal.        Thought Content: Thought content normal.        Judgment: Judgment normal.     {Labs (optional):23779}   Assessment & Plan:  Covey was seen today for new patient (initial visit).  Diagnoses and all orders for this visit:  Encounter to establish care  Hyperlipidemia, unspecified hyperlipidemia type -     Lipid panel; Future  Cervical radiculopathy -     Ambulatory referral to Neurosurgery  Primary hypertension BP goal - < 130/80 Explained that having normal blood pressure is the goal and medications are helping to get to goal and maintain normal blood pressure. DIET: Limit salt intake, read nutrition labels to check salt content, limit fried and high fatty foods  Avoid using multisymptom OTC cold preparations that generally contain sudafed which can rise BP. Consult with pharmacist on best cold relief products to use for persons with HTN EXERCISE Discussed incorporating exercise such as walking - 30 minutes most days of the week and can do in 10 minute intervals    -     CBC with Differential/Platelet; Future -     CMP14+EGFR; Future  Coronary artery disease involving native coronary artery of native heart without angina pectoris 2/2 Sinus node dysfunction  -     Lipid panel; Future  Tobacco use -     Ambulatory Referral for Lung Cancer Screening [REF832]  Elevated PSA -     PSA; Future    Follow-up:  Return in about 3 months (around 11/26/2023).  The above assessment and management plan was discussed with the patient. The patient verbalized understanding of and has agreed to the management plan. Patient is aware to call the clinic if symptoms fail to improve or worsen. Patient is aware when to return to the clinic for a follow-up visit. Patient educated on when it is  appropriate to go to the emergency department.   Madelyn Schick, NP-C

## 2023-09-03 ENCOUNTER — Other Ambulatory Visit (INDEPENDENT_AMBULATORY_CARE_PROVIDER_SITE_OTHER): Payer: Self-pay | Admitting: Primary Care

## 2023-09-25 NOTE — Progress Notes (Unsigned)
  Electrophysiology Office Note:   ID:  Robert [name], DOB 1965/05/17, MRN [id number]  Primary Cardiologist: None Electrophysiologist: Elspeth Sage, MD  {Click to update primary MD,subspecialty MD or APP then REFRESH:1}    History of Present Illness:   Robert [name] is a 58 y.o. male with h/o symptomatic bradycardia s/p PPM, HTN, and CVA seen today for routine electrophysiology followup.   Last seen in person 02/2021. Last device remote 05/2021. No response to multiple calls and letters.   Since last being seen in our clinic the patient reports doing ***.  he denies chest pain, palpitations, dyspnea, PND, orthopnea, nausea, vomiting, dizziness, syncope, edema, weight gain, or early satiety.   Review of systems complete and found to be negative unless listed in HPI.   EP Information / Studies Reviewed:    EKG is ordered today. Personal review as below.       PPM Interrogation-  reviewed in detail today,  See PACEART report.  Arrhythmia/Device History Abbott Dual Chamber PPM 11/2020 for symptomatic bradycardia   Physical Exam:   VS:  There were no vitals taken for this visit.   Wt Readings from Last 3 Encounters:  08/26/23 200 lb 3.2 oz (90.8 kg)  02/19/21 182 lb 9.6 oz (82.8 kg)  12/26/20 186 lb 6.4 oz (84.6 kg)     GEN: No acute distress  NECK: No JVD; No carotid bruits CARDIAC: {EPRHYTHM:28826}, no murmurs, rubs, gallops RESPIRATORY:  Clear to auscultation without rales, wheezing or rhonchi  ABDOMEN: Soft, non-tender, non-distended EXTREMITIES:  {EDEMA LEVEL:28147::No} edema; No deformity   ASSESSMENT AND PLAN:    Symptomatic bradycardia s/p Abbott PPM  Normal PPM function See Pace Art report No changes today  CAD No s/s of ischemia.      H/o CVA Follow device for any arrhythmia   {Click here to Review PMH, Prob List, Meds, Allergies, SHx, FHx  :1}   Disposition:   Follow up with {EPPROVIDERS:28135::EP Team} {EPFOLLOW UP:28173}  Signed, Ozell Prentice Passey, PA-C

## 2023-09-26 ENCOUNTER — Ambulatory Visit: Attending: Student | Admitting: Student

## 2023-09-26 ENCOUNTER — Encounter: Payer: Self-pay | Admitting: Student

## 2023-09-26 ENCOUNTER — Telehealth: Payer: Self-pay

## 2023-09-26 VITALS — BP 114/80 | HR 67 | Ht 72.0 in | Wt 206.0 lb

## 2023-09-26 DIAGNOSIS — I10 Essential (primary) hypertension: Secondary | ICD-10-CM | POA: Diagnosis not present

## 2023-09-26 DIAGNOSIS — I495 Sick sinus syndrome: Secondary | ICD-10-CM | POA: Insufficient documentation

## 2023-09-26 DIAGNOSIS — I2510 Atherosclerotic heart disease of native coronary artery without angina pectoris: Secondary | ICD-10-CM | POA: Diagnosis not present

## 2023-09-26 DIAGNOSIS — I639 Cerebral infarction, unspecified: Secondary | ICD-10-CM | POA: Insufficient documentation

## 2023-09-26 LAB — CUP PACEART INCLINIC DEVICE CHECK
Battery Remaining Longevity: 105 mo
Battery Voltage: 3.01 V
Brady Statistic RA Percent Paced: 31 %
Brady Statistic RV Percent Paced: 0.96 %
Date Time Interrogation Session: 20250718121504
Implantable Lead Connection Status: 753985
Implantable Lead Connection Status: 753985
Implantable Lead Implant Date: 20220912
Implantable Lead Implant Date: 20220912
Implantable Lead Location: 753859
Implantable Lead Location: 753860
Implantable Lead Model: 3830
Implantable Pulse Generator Implant Date: 20220912
Lead Channel Impedance Value: 475 Ohm
Lead Channel Impedance Value: 525 Ohm
Lead Channel Pacing Threshold Amplitude: 0.5 V
Lead Channel Pacing Threshold Amplitude: 0.5 V
Lead Channel Pacing Threshold Amplitude: 1.25 V
Lead Channel Pacing Threshold Amplitude: 1.25 V
Lead Channel Pacing Threshold Pulse Width: 0.5 ms
Lead Channel Pacing Threshold Pulse Width: 0.5 ms
Lead Channel Pacing Threshold Pulse Width: 0.8 ms
Lead Channel Pacing Threshold Pulse Width: 0.8 ms
Lead Channel Sensing Intrinsic Amplitude: 2.6 mV
Lead Channel Sensing Intrinsic Amplitude: 8.2 mV
Lead Channel Setting Pacing Amplitude: 2 V
Lead Channel Setting Pacing Amplitude: 2.5 V
Lead Channel Setting Pacing Pulse Width: 0.7 ms
Lead Channel Setting Sensing Sensitivity: 0.7 mV
Pulse Gen Model: 2272
Pulse Gen Serial Number: 3961405

## 2023-09-26 NOTE — Telephone Encounter (Signed)
 I ordered the pt a new monitor and cell adapter. He should receive it in 7-14 business days.

## 2023-09-26 NOTE — Patient Instructions (Signed)
 Medication Instructions:  Your physician recommends that you continue on your current medications as directed. Please refer to the Current Medication list given to you today.  *If you need a refill on your cardiac medications before your next appointment, please call your pharmacy*  Lab Work: None ordered If you have labs (blood work) drawn today and your tests are completely normal, you will receive your results only by: MyChart Message (if you have MyChart) OR A paper copy in the mail If you have any lab test that is abnormal or we need to change your treatment, we will call you to review the results.  Follow-Up: At [facility], you and your health needs are our priority.  As part of our continuing mission to provide you with exceptional heart care, our providers are all part of one team.  This team includes your primary Cardiologist (physician) and Advanced Practice Providers or APPs (Physician Assistants and Nurse Practitioners) who all work together to provide you with the care you need, when you need it.  Your next appointment:   1 year(s)  Provider:   You will see one of the following Advanced Practice Providers on your designated Care Team:   Mertha Abrahams, Kennard Pea [address] [place], PA-C Suzann Riddle, NP Creighton Doffing, NP

## 2023-10-17 ENCOUNTER — Other Ambulatory Visit: Payer: Self-pay

## 2023-10-17 DIAGNOSIS — M5412 Radiculopathy, cervical region: Secondary | ICD-10-CM

## 2023-10-18 DIAGNOSIS — L539 Erythematous condition, unspecified: Secondary | ICD-10-CM | POA: Diagnosis not present

## 2023-10-18 DIAGNOSIS — T24231A Burn of second degree of right lower leg, initial encounter: Secondary | ICD-10-CM | POA: Diagnosis not present

## 2023-10-20 ENCOUNTER — Other Ambulatory Visit (HOSPITAL_COMMUNITY): Payer: Self-pay

## 2023-10-20 DIAGNOSIS — M5412 Radiculopathy, cervical region: Secondary | ICD-10-CM

## 2023-11-24 ENCOUNTER — Encounter (HOSPITAL_COMMUNITY): Payer: Self-pay

## 2023-11-24 ENCOUNTER — Ambulatory Visit (HOSPITAL_COMMUNITY): Admission: RE | Admit: 2023-11-24 | Source: Ambulatory Visit

## 2023-11-25 ENCOUNTER — Encounter (INDEPENDENT_AMBULATORY_CARE_PROVIDER_SITE_OTHER): Payer: Self-pay | Admitting: Primary Care

## 2023-12-02 ENCOUNTER — Ambulatory Visit (INDEPENDENT_AMBULATORY_CARE_PROVIDER_SITE_OTHER)

## 2023-12-02 DIAGNOSIS — I495 Sick sinus syndrome: Secondary | ICD-10-CM | POA: Diagnosis not present

## 2023-12-02 LAB — CUP PACEART REMOTE DEVICE CHECK
Battery Remaining Longevity: 88 mo
Battery Remaining Percentage: 78 %
Battery Voltage: 3.01 V
Brady Statistic AP VP Percent: 1.7 %
Brady Statistic AP VS Percent: 37 %
Brady Statistic AS VP Percent: 2 %
Brady Statistic AS VS Percent: 55 %
Brady Statistic RA Percent Paced: 31 %
Brady Statistic RV Percent Paced: 3.7 %
Date Time Interrogation Session: 20250923020013
Implantable Lead Connection Status: 753985
Implantable Lead Connection Status: 753985
Implantable Lead Implant Date: 20220912
Implantable Lead Implant Date: 20220912
Implantable Lead Location: 753859
Implantable Lead Location: 753860
Implantable Lead Model: 3830
Implantable Pulse Generator Implant Date: 20220912
Lead Channel Impedance Value: 460 Ohm
Lead Channel Impedance Value: 490 Ohm
Lead Channel Pacing Threshold Amplitude: 0.5 V
Lead Channel Pacing Threshold Amplitude: 1.25 V
Lead Channel Pacing Threshold Pulse Width: 0.5 ms
Lead Channel Pacing Threshold Pulse Width: 0.8 ms
Lead Channel Sensing Intrinsic Amplitude: 2.7 mV
Lead Channel Sensing Intrinsic Amplitude: 8.5 mV
Lead Channel Setting Pacing Amplitude: 2 V
Lead Channel Setting Pacing Amplitude: 2.5 V
Lead Channel Setting Pacing Pulse Width: 0.7 ms
Lead Channel Setting Sensing Sensitivity: 0.7 mV
Pulse Gen Model: 2272
Pulse Gen Serial Number: 3961405

## 2023-12-03 ENCOUNTER — Ambulatory Visit: Payer: Self-pay | Admitting: Cardiology

## 2023-12-03 NOTE — Progress Notes (Signed)
 Remote PPM Transmission

## 2023-12-10 ENCOUNTER — Other Ambulatory Visit: Payer: Self-pay | Admitting: Pharmacist

## 2023-12-10 MED ORDER — ATORVASTATIN CALCIUM 80 MG PO TABS: 80.0000 mg | ORAL_TABLET | Freq: Every day | ORAL | 1 refills | Status: AC

## 2023-12-10 NOTE — Progress Notes (Signed)
 Pharmacy Quality Measure Review  This patient is appearing on a report for the adherence measure for cholesterol (statin) medications this calendar year.   Medication: atorvastatin  Last fill date: 12/04/2023 for 30 day supply  Insurance report was not up to date. No action needed at this time.   Herlene Fleeta Morris, PharmD, JAQUELINE, CPP Clinical Pharmacist [facility] [phone]

## 2024-01-07 ENCOUNTER — Other Ambulatory Visit: Payer: Self-pay | Admitting: Pharmacist

## 2024-01-07 MED ORDER — HYDROCHLOROTHIAZIDE 25 MG PO TABS: 25.0000 mg | ORAL_TABLET | Freq: Every day | ORAL | 0 refills | Status: DC

## 2024-01-07 NOTE — Progress Notes (Signed)
 Pharmacy Quality Measure Review  This patient is appearing on a report for the adherence measure for cholesterol (statin) medications this calendar year.   Medication: atorvastatin  Last fill date: 12/04/2023 for 30 day supply  Call placed to pharmacy to order refills. Refill is ready for pick-up. Call placed to patient to let him know the atorvastatin  is ready for pick-up.  Herlene Fleeta Morris, PharmD, JAQUELINE, CPP Clinical Pharmacist [facility] [phone]

## 2024-01-14 ENCOUNTER — Other Ambulatory Visit: Payer: Self-pay | Admitting: Pharmacist

## 2024-01-14 NOTE — Progress Notes (Signed)
 Pharmacy Quality Measure Review  This patient is appearing on a report for the adherence measure for cholesterol (statin) medications this calendar year.   Medication: atorvastatin  Last fill date: 01/08/2024 for 90 day supply  Refills are now up to date. No follow-up needed at this time.   Herlene Fleeta Morris, PharmD, JAQUELINE, CPP Clinical Pharmacist [facility] [phone]

## 2024-01-19 ENCOUNTER — Ambulatory Visit: Payer: Self-pay | Admitting: Cardiology

## 2024-02-17 ENCOUNTER — Encounter: Payer: Self-pay | Admitting: Radiology

## 2024-02-23 ENCOUNTER — Other Ambulatory Visit: Payer: Self-pay

## 2024-02-23 DIAGNOSIS — R29898 Other symptoms and signs involving the musculoskeletal system: Secondary | ICD-10-CM

## 2024-03-02 ENCOUNTER — Ambulatory Visit: Attending: Primary Care

## 2024-03-02 DIAGNOSIS — I495 Sick sinus syndrome: Secondary | ICD-10-CM | POA: Diagnosis not present

## 2024-03-08 LAB — CUP PACEART REMOTE DEVICE CHECK
Battery Remaining Longevity: 87 mo
Battery Remaining Percentage: 76 %
Battery Voltage: 3.01 V
Brady Statistic AP VP Percent: 1.4 %
Brady Statistic AP VS Percent: 37 %
Brady Statistic AS VP Percent: 1.5 %
Brady Statistic AS VS Percent: 58 %
Brady Statistic RA Percent Paced: 33 %
Brady Statistic RV Percent Paced: 2.8 %
Date Time Interrogation Session: 20251227172253
Implantable Lead Connection Status: 753985
Implantable Lead Connection Status: 753985
Implantable Lead Implant Date: 20220912
Implantable Lead Implant Date: 20220912
Implantable Lead Location: 753859
Implantable Lead Location: 753860
Implantable Lead Model: 3830
Implantable Pulse Generator Implant Date: 20220912
Lead Channel Impedance Value: 480 Ohm
Lead Channel Impedance Value: 530 Ohm
Lead Channel Pacing Threshold Amplitude: 0.5 V
Lead Channel Pacing Threshold Amplitude: 1.25 V
Lead Channel Pacing Threshold Pulse Width: 0.5 ms
Lead Channel Pacing Threshold Pulse Width: 0.8 ms
Lead Channel Sensing Intrinsic Amplitude: 11.5 mV
Lead Channel Sensing Intrinsic Amplitude: 2.6 mV
Lead Channel Setting Pacing Amplitude: 2 V
Lead Channel Setting Pacing Amplitude: 2.5 V
Lead Channel Setting Pacing Pulse Width: 0.7 ms
Lead Channel Setting Sensing Sensitivity: 0.7 mV
Pulse Gen Model: 2272
Pulse Gen Serial Number: 3961405

## 2024-03-10 NOTE — Progress Notes (Signed)
 Remote PPM Transmission

## 2024-03-12 ENCOUNTER — Telehealth: Payer: Self-pay

## 2024-03-12 NOTE — Discharge Instructions (Signed)

## 2024-03-13 ENCOUNTER — Ambulatory Visit: Payer: Self-pay | Admitting: Cardiology

## 2024-03-15 ENCOUNTER — Inpatient Hospital Stay: Admission: RE | Admit: 2024-03-15 | Discharge: 2024-03-15 | Disposition: A | Source: Ambulatory Visit

## 2024-03-15 ENCOUNTER — Ambulatory Visit
Admission: RE | Admit: 2024-03-15 | Discharge: 2024-03-15 | Disposition: A | Discharge status: Home / Self Care | Source: Ambulatory Visit

## 2024-03-15 DIAGNOSIS — R29898 Other symptoms and signs involving the musculoskeletal system: Secondary | ICD-10-CM

## 2024-03-15 MED ORDER — ONDANSETRON HCL 4 MG/2ML IJ SOLN: 4.0000 mg | Freq: Once | INTRAMUSCULAR | Status: DC | PRN

## 2024-03-15 MED ORDER — MEPERIDINE HCL 50 MG/ML IJ SOLN: 50.0000 mg | Freq: Once | INTRAMUSCULAR | Status: DC | PRN

## 2024-03-15 MED ORDER — IOPAMIDOL (ISOVUE-M 300) INJECTION 61%
10.0000 mL | Freq: Once | INTRAMUSCULAR | Status: AC
  Administered 2024-03-15: 10 mL via INTRATHECAL

## 2024-03-15 MED ORDER — DIAZEPAM 5 MG PO TABS: 10.0000 mg | ORAL_TABLET | Freq: Once | ORAL | Status: DC

## 2024-04-03 ENCOUNTER — Other Ambulatory Visit: Payer: Self-pay | Admitting: Family Medicine

## 2024-04-14 ENCOUNTER — Telehealth (INDEPENDENT_AMBULATORY_CARE_PROVIDER_SITE_OTHER): Payer: Self-pay | Admitting: Primary Care

## 2024-04-14 NOTE — Telephone Encounter (Signed)
 Spoke to pt about upcoming appt.. Will be present

## 2024-04-15 ENCOUNTER — Ambulatory Visit (INDEPENDENT_AMBULATORY_CARE_PROVIDER_SITE_OTHER): Admitting: Primary Care

## 2024-04-16 ENCOUNTER — Ambulatory Visit

## 2024-04-16 ENCOUNTER — Ambulatory Visit: Payer: Self-pay

## 2024-04-16 DIAGNOSIS — I2510 Atherosclerotic heart disease of native coronary artery without angina pectoris: Secondary | ICD-10-CM

## 2024-04-16 DIAGNOSIS — I10 Essential (primary) hypertension: Secondary | ICD-10-CM

## 2024-04-16 DIAGNOSIS — E785 Hyperlipidemia, unspecified: Secondary | ICD-10-CM

## 2024-04-16 DIAGNOSIS — R972 Elevated prostate specific antigen [PSA]: Secondary | ICD-10-CM

## 2024-04-21 ENCOUNTER — Ambulatory Visit

## 2024-06-01 ENCOUNTER — Encounter

## 2024-07-13 ENCOUNTER — Ambulatory Visit (INDEPENDENT_AMBULATORY_CARE_PROVIDER_SITE_OTHER): Admitting: Primary Care

## 2024-08-31 ENCOUNTER — Encounter

## 2024-11-30 ENCOUNTER — Encounter

## 2025-03-01 ENCOUNTER — Encounter
